# Patient Record
Sex: Female | Born: 1950 | Race: White | Hispanic: No | Marital: Married | Smoking: Never smoker
Health system: Southern US, Community
[De-identification: ages and names within clinical notes are randomized; demographics above are authoritative.]

## PROBLEM LIST (undated history)

## (undated) DIAGNOSIS — K219 Gastro-esophageal reflux disease without esophagitis: Secondary | ICD-10-CM

## (undated) DIAGNOSIS — Z9889 Other specified postprocedural states: Secondary | ICD-10-CM

## (undated) DIAGNOSIS — M5416 Radiculopathy, lumbar region: Secondary | ICD-10-CM

## (undated) DIAGNOSIS — Z79899 Other long term (current) drug therapy: Secondary | ICD-10-CM

## (undated) DIAGNOSIS — M1990 Unspecified osteoarthritis, unspecified site: Secondary | ICD-10-CM

## (undated) DIAGNOSIS — G8929 Other chronic pain: Secondary | ICD-10-CM

## (undated) DIAGNOSIS — N390 Urinary tract infection, site not specified: Secondary | ICD-10-CM

## (undated) DIAGNOSIS — B019 Varicella without complication: Secondary | ICD-10-CM

## (undated) DIAGNOSIS — R51 Headache: Secondary | ICD-10-CM

## (undated) DIAGNOSIS — E785 Hyperlipidemia, unspecified: Secondary | ICD-10-CM

## (undated) DIAGNOSIS — H269 Unspecified cataract: Secondary | ICD-10-CM

## (undated) DIAGNOSIS — I639 Cerebral infarction, unspecified: Secondary | ICD-10-CM

## (undated) DIAGNOSIS — R519 Headache, unspecified: Secondary | ICD-10-CM

## (undated) DIAGNOSIS — I10 Essential (primary) hypertension: Secondary | ICD-10-CM

## (undated) HISTORY — DX: Headache: R51

## (undated) HISTORY — DX: Unspecified osteoarthritis, unspecified site: M19.90

## (undated) HISTORY — DX: Headache, unspecified: R51.9

## (undated) HISTORY — DX: Other chronic pain: G89.29

## (undated) HISTORY — DX: Varicella without complication: B01.9

## (undated) HISTORY — PX: CARPAL TUNNEL RELEASE: SHX101

## (undated) HISTORY — PX: TONSILLECTOMY: SHX5217

## (undated) HISTORY — DX: Urinary tract infection, site not specified: N39.0

## (undated) HISTORY — DX: Other specified postprocedural states: Z98.890

## (undated) HISTORY — DX: Cerebral infarction, unspecified: I63.9

## (undated) HISTORY — PX: ROTATOR CUFF REPAIR: SHX139

## (undated) HISTORY — DX: Gastro-esophageal reflux disease without esophagitis: K21.9

## (undated) HISTORY — DX: Essential (primary) hypertension: I10

## (undated) HISTORY — PX: EYE SURGERY: SHX253

## (undated) HISTORY — DX: Radiculopathy, lumbar region: M54.16

## (undated) HISTORY — DX: Hyperlipidemia, unspecified: E78.5

## (undated) HISTORY — PX: CHOLECYSTECTOMY: SHX55

## (undated) HISTORY — DX: Unspecified cataract: H26.9

## (undated) HISTORY — DX: Other long term (current) drug therapy: Z79.899

## (undated) HISTORY — PX: CATARACT EXTRACTION: SUR2

## (undated) HISTORY — PX: SPINE SURGERY: SHX786

---

## 2012-12-13 ENCOUNTER — Ambulatory Visit (INDEPENDENT_AMBULATORY_CARE_PROVIDER_SITE_OTHER): Payer: Managed Care, Other (non HMO) | Admitting: Internal Medicine

## 2012-12-13 ENCOUNTER — Encounter: Payer: Self-pay | Admitting: Internal Medicine

## 2012-12-13 VITALS — BP 140/88 | HR 92 | Temp 98.1°F | Ht 66.5 in | Wt 139.0 lb

## 2012-12-13 DIAGNOSIS — Z79890 Hormone replacement therapy (postmenopausal): Secondary | ICD-10-CM | POA: Insufficient documentation

## 2012-12-13 DIAGNOSIS — IMO0002 Reserved for concepts with insufficient information to code with codable children: Secondary | ICD-10-CM

## 2012-12-13 DIAGNOSIS — G8929 Other chronic pain: Secondary | ICD-10-CM

## 2012-12-13 DIAGNOSIS — J029 Acute pharyngitis, unspecified: Secondary | ICD-10-CM | POA: Insufficient documentation

## 2012-12-13 DIAGNOSIS — M129 Arthropathy, unspecified: Secondary | ICD-10-CM

## 2012-12-13 DIAGNOSIS — Z79899 Other long term (current) drug therapy: Secondary | ICD-10-CM | POA: Insufficient documentation

## 2012-12-13 DIAGNOSIS — E785 Hyperlipidemia, unspecified: Secondary | ICD-10-CM

## 2012-12-13 DIAGNOSIS — Z9889 Other specified postprocedural states: Secondary | ICD-10-CM | POA: Insufficient documentation

## 2012-12-13 DIAGNOSIS — R519 Headache, unspecified: Secondary | ICD-10-CM

## 2012-12-13 DIAGNOSIS — M1990 Unspecified osteoarthritis, unspecified site: Secondary | ICD-10-CM | POA: Insufficient documentation

## 2012-12-13 DIAGNOSIS — R51 Headache: Secondary | ICD-10-CM

## 2012-12-13 NOTE — Patient Instructions (Addendum)
I think you have a viral throat infection that will run its course can use Tylenol gargles time he may get more congested and cough before it resolves. We will let you know about your backup strep test however  Would like you to do a headache diary for now you're using headache medicine.  Most people do better on some type of controller regimen and use the rescue medicine sparingly as they can cause rebound headaches and have sedative side effects.  I would like to get another opinion about your recurrent headaches.  I do not routinely prescribe more than 20-30 in a bottle of that particular medication.  If more needed would suggest we see a headache specialist.   I would not feel comfortable prescribing phentermine for you at a normal body weight because of his cardiovascular potential risk factors for stroke and heart disease.. This medication can raise blood pressure.  At this time I would think the risk is higher than the benefit. If you wish to seek care at a weight control sound heard they can reassess this.  Blood pressure is borderline high today 140/90 140/88 this could because of illness we will follow this.  Agree to continue with water exercise and walking to keep up your conditioning. I will review the records and make a decision about laboratory monitoring and followup. Please sign up for my chart that will help with communication in the future.
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
## 2012-12-15 ENCOUNTER — Encounter: Payer: Self-pay | Admitting: Family Medicine

## 2012-12-15 LAB — CULTURE, GROUP A STREP: Organism ID, Bacteria: NORMAL

## 2013-01-02 ENCOUNTER — Telehealth: Payer: Self-pay | Admitting: Internal Medicine

## 2013-01-02 NOTE — Telephone Encounter (Signed)
Upon check-out with spouse, patient states she would like to change from Dr. Fabian Sharp to Dr. Caryl Never as her PCP.  She has an upcoming appointment on 01/18/13 that she would like to have with Dr. Caryl Never.  Please advise.   Thanks a bunch  [employer]

## 2013-01-03 NOTE — Telephone Encounter (Signed)
Ok with me 

## 2013-01-04 NOTE — Telephone Encounter (Signed)
Ok

## 2013-01-18 ENCOUNTER — Encounter: Payer: Managed Care, Other (non HMO) | Admitting: Internal Medicine

## 2013-02-02 ENCOUNTER — Ambulatory Visit (INDEPENDENT_AMBULATORY_CARE_PROVIDER_SITE_OTHER): Payer: Managed Care, Other (non HMO)

## 2013-02-02 DIAGNOSIS — Z23 Encounter for immunization: Secondary | ICD-10-CM

## 2013-02-12 ENCOUNTER — Other Ambulatory Visit: Payer: Self-pay

## 2013-02-12 ENCOUNTER — Encounter: Payer: Self-pay | Admitting: Family Medicine

## 2013-02-12 ENCOUNTER — Ambulatory Visit (INDEPENDENT_AMBULATORY_CARE_PROVIDER_SITE_OTHER): Payer: Managed Care, Other (non HMO) | Admitting: Family Medicine

## 2013-02-12 VITALS — BP 136/74 | HR 88 | Temp 97.5°F | Wt 146.0 lb

## 2013-02-12 DIAGNOSIS — Z0000 Encounter for general adult medical examination without abnormal findings: Secondary | ICD-10-CM

## 2013-02-12 DIAGNOSIS — Z1231 Encounter for screening mammogram for malignant neoplasm of breast: Secondary | ICD-10-CM

## 2013-02-12 LAB — LIPID PANEL
Cholesterol: 183 mg/dL (ref 0–200)
HDL: 74.5 mg/dL (ref >39.00)
LDL Cholesterol: 78 mg/dL (ref 0–99)
Total CHOL/HDL Ratio: 2

## 2013-02-12 LAB — BASIC METABOLIC PANEL
BUN: 16 mg/dL (ref 6–23)
Calcium: 9.3 mg/dL (ref 8.4–10.5)
Chloride: 103 mEq/L (ref 96–112)
Creatinine, Ser: 1.1 mg/dL (ref 0.4–1.2)
GFR: 55.32 mL/min — ABNORMAL LOW (ref >60.00)
Potassium: 3.9 mEq/L (ref 3.5–5.1)
Sodium: 142 mEq/L (ref 135–145)

## 2013-02-12 LAB — HEPATIC FUNCTION PANEL
Alkaline Phosphatase: 86 U/L (ref 39–117)
Bilirubin, Direct: 0.1 mg/dL (ref 0.0–0.3)
Total Bilirubin: 0.4 mg/dL (ref 0.3–1.2)
Total Protein: 7 g/dL (ref 6.0–8.3)

## 2013-02-12 LAB — POCT URINALYSIS DIPSTICK
Glucose, UA: NEGATIVE
Ketones, UA: NEGATIVE
Spec Grav, UA: 1.02
Urobilinogen, UA: 0.2

## 2013-02-12 LAB — CBC WITH DIFFERENTIAL/PLATELET
Basophils Relative: 0.5 % (ref 0.0–3.0)
HCT: 40.7 % (ref 36.0–46.0)
Hemoglobin: 14 g/dL (ref 12.0–15.0)
Lymphocytes Relative: 28 % (ref 12.0–46.0)
MCHC: 34.5 g/dL (ref 30.0–36.0)
Monocytes Relative: 6.1 % (ref 3.0–12.0)
Neutro Abs: 4.8 10*3/uL (ref 1.4–7.7)
RBC: 4.63 Mil/uL (ref 3.87–5.11)

## 2013-02-12 MED ORDER — PRAVASTATIN SODIUM 80 MG PO TABS: 80.0000 mg | ORAL_TABLET | Freq: Every day | ORAL | Status: DC

## 2013-02-12 MED ORDER — CARISOPRODOL 350 MG PO TABS: 350.0000 mg | ORAL_TABLET | Freq: Every day | ORAL | Status: DC

## 2013-02-12 NOTE — Patient Instructions (Signed)
Check on insurance coverage for shingles vaccine Set up mammogram
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
## 2013-02-23 ENCOUNTER — Encounter: Payer: Self-pay | Admitting: Family Medicine

## 2013-02-23 ENCOUNTER — Other Ambulatory Visit: Payer: Self-pay

## 2013-02-23 MED ORDER — GABAPENTIN 600 MG PO TABS: ORAL_TABLET | ORAL | Status: DC

## 2013-03-01 ENCOUNTER — Ambulatory Visit (INDEPENDENT_AMBULATORY_CARE_PROVIDER_SITE_OTHER): Payer: Managed Care, Other (non HMO) | Admitting: Family Medicine

## 2013-03-01 DIAGNOSIS — Z23 Encounter for immunization: Secondary | ICD-10-CM

## 2013-03-14 ENCOUNTER — Ambulatory Visit
Admission: RE | Admit: 2013-03-14 | Discharge: 2013-03-14 | Disposition: A | Discharge status: Home / Self Care | Payer: Managed Care, Other (non HMO) | Source: Ambulatory Visit

## 2013-03-14 DIAGNOSIS — Z1231 Encounter for screening mammogram for malignant neoplasm of breast: Secondary | ICD-10-CM

## 2013-03-20 ENCOUNTER — Encounter: Payer: Self-pay | Admitting: Family Medicine

## 2013-03-20 ENCOUNTER — Other Ambulatory Visit: Payer: Self-pay

## 2013-03-20 MED ORDER — ESOMEPRAZOLE MAGNESIUM 40 MG PO CPDR: 40.0000 mg | DELAYED_RELEASE_CAPSULE | Freq: Every day | ORAL | Status: DC

## 2013-03-22 ENCOUNTER — Other Ambulatory Visit: Payer: Self-pay

## 2013-03-22 ENCOUNTER — Encounter: Payer: Self-pay | Admitting: Family Medicine

## 2013-03-22 MED ORDER — BUTALBITAL-APAP-CAFFEINE 50-325-40 MG PO TABS: 1.0000 | ORAL_TABLET | ORAL | Status: DC | PRN

## 2013-04-21 ENCOUNTER — Encounter: Payer: Self-pay | Admitting: Family Medicine

## 2013-04-23 ENCOUNTER — Other Ambulatory Visit: Payer: Self-pay

## 2013-04-23 MED ORDER — CONJ ESTROG-MEDROXYPROGEST ACE 0.625-5 MG PO TABS: 1.0000 | ORAL_TABLET | Freq: Every day | ORAL | Status: DC

## 2013-05-23 ENCOUNTER — Encounter: Payer: Self-pay | Admitting: Family Medicine

## 2013-05-23 ENCOUNTER — Other Ambulatory Visit: Payer: Self-pay

## 2013-05-23 ENCOUNTER — Telehealth: Payer: Self-pay

## 2013-05-23 MED ORDER — CARISOPRODOL 350 MG PO TABS: 350.0000 mg | ORAL_TABLET | Freq: Every day | ORAL | Status: DC

## 2013-05-23 MED ORDER — GABAPENTIN 600 MG PO TABS: ORAL_TABLET | ORAL | Status: DC

## 2013-05-23 MED ORDER — PRAVASTATIN SODIUM 80 MG PO TABS: 80.0000 mg | ORAL_TABLET | Freq: Every day | ORAL | Status: DC

## 2013-05-23 NOTE — Telephone Encounter (Signed)
Refill ok? 

## 2013-05-23 NOTE — Telephone Encounter (Signed)
SOMA  Last visit 02/12/13 Last refill 02/12/13 #90 1

## 2013-05-23 NOTE — Telephone Encounter (Signed)
[facility] faxed to [facility] 

## 2013-05-24 ENCOUNTER — Other Ambulatory Visit: Payer: Self-pay

## 2013-05-24 MED ORDER — PRAVASTATIN SODIUM 80 MG PO TABS: 80.0000 mg | ORAL_TABLET | Freq: Every day | ORAL | Status: DC

## 2013-05-25 ENCOUNTER — Telehealth: Payer: Self-pay | Admitting: Family Medicine

## 2013-05-25 NOTE — Telephone Encounter (Signed)
Sonia Side from [facility] needs clarification on new rx pravastatin (PRAVACHOL) 80 MG tablet, pt is requesting generic and rx was written for daw1.

## 2013-05-25 NOTE — Telephone Encounter (Signed)
Talked to [facility]. They will be sending out the generic only to patient address

## 2013-06-19 ENCOUNTER — Encounter: Payer: Self-pay | Admitting: *Deleted

## 2013-06-20 ENCOUNTER — Ambulatory Visit (INDEPENDENT_AMBULATORY_CARE_PROVIDER_SITE_OTHER): Payer: BC Managed Care – PPO | Admitting: Family Medicine

## 2013-06-20 ENCOUNTER — Encounter: Payer: Self-pay | Admitting: Family Medicine

## 2013-06-20 VITALS — BP 132/78 | HR 98 | Wt 155.0 lb

## 2013-06-20 DIAGNOSIS — IMO0002 Reserved for concepts with insufficient information to code with codable children: Secondary | ICD-10-CM

## 2013-06-20 DIAGNOSIS — M5416 Radiculopathy, lumbar region: Secondary | ICD-10-CM

## 2013-06-20 NOTE — Progress Notes (Signed)
Pre visit review using our clinic review tool, if applicable. No additional management support is needed unless otherwise documented below in the visit note. 
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
## 2013-07-06 ENCOUNTER — Other Ambulatory Visit: Payer: Self-pay

## 2013-07-06 ENCOUNTER — Telehealth: Payer: Self-pay

## 2013-07-06 ENCOUNTER — Encounter: Payer: Self-pay | Admitting: Family Medicine

## 2013-07-06 MED ORDER — ESOMEPRAZOLE MAGNESIUM 40 MG PO CPDR: 40.0000 mg | DELAYED_RELEASE_CAPSULE | Freq: Every day | ORAL | Status: DC

## 2013-07-06 MED ORDER — BUTALBITAL-APAP-CAFFEINE 50-325-40 MG PO TABS: 1.0000 | ORAL_TABLET | ORAL | Status: DC | PRN

## 2013-07-06 NOTE — Telephone Encounter (Signed)
Rx faxed to [facility].  

## 2013-07-06 NOTE — Telephone Encounter (Signed)
Fioricet  Last refill 03/12/13 #270 Last visit 06/20/13

## 2013-07-06 NOTE — Telephone Encounter (Signed)
May refill #120.  We really need to look at prophylaxis if she is needing > this amount over several months.

## 2013-08-08 ENCOUNTER — Telehealth: Payer: Self-pay

## 2013-08-08 ENCOUNTER — Encounter: Payer: Self-pay | Admitting: Family Medicine

## 2013-08-08 NOTE — Telephone Encounter (Signed)
Refill on buutalbital-acet-caff  Last visit 06/20/13 Last refill 07/06/13 #120 0 refill   FYI this is what the patient sent: in March I asked for RX Fioricet tab Generic But/APAP/CAF tab APAP 325 qty 270 for a 3 month supply and always had refills on this, but you have reduced the qty to 120 with NO refill's Why would you do that? I am going on vacation in 2 weeks and will be out of the country for a month, what am I going to do when I run out of these when away and this will happen. I need another RX

## 2013-08-09 ENCOUNTER — Encounter: Payer: Self-pay | Admitting: Family Medicine

## 2013-08-10 MED ORDER — BUTALBITAL-APAP-CAFFEINE 50-325-40 MG PO TABS: 1.0000 | ORAL_TABLET | ORAL | Status: DC | PRN

## 2013-08-10 NOTE — Telephone Encounter (Signed)
We can refill once since she will be out of country.  When she gets back i want to see her to discuss.  Fioricet is never recommended for daily regular use for headaches and we need to evaluate other options.

## 2013-08-10 NOTE — Telephone Encounter (Signed)
Faxed RX to [facility]

## 2013-10-01 ENCOUNTER — Encounter: Payer: Self-pay | Admitting: Family Medicine

## 2013-10-02 ENCOUNTER — Encounter: Payer: Self-pay | Admitting: Family Medicine

## 2013-10-05 ENCOUNTER — Ambulatory Visit (INDEPENDENT_AMBULATORY_CARE_PROVIDER_SITE_OTHER): Payer: BC Managed Care – PPO | Admitting: Family Medicine

## 2013-10-05 ENCOUNTER — Encounter: Payer: Self-pay | Admitting: Family Medicine

## 2013-10-05 VITALS — BP 132/74 | HR 77 | Temp 98.6°F | Wt 160.0 lb

## 2013-10-05 DIAGNOSIS — R635 Abnormal weight gain: Secondary | ICD-10-CM

## 2013-10-05 DIAGNOSIS — E785 Hyperlipidemia, unspecified: Secondary | ICD-10-CM

## 2013-10-05 DIAGNOSIS — M5416 Radiculopathy, lumbar region: Secondary | ICD-10-CM

## 2013-10-05 DIAGNOSIS — R51 Headache: Secondary | ICD-10-CM

## 2013-10-05 DIAGNOSIS — G8929 Other chronic pain: Secondary | ICD-10-CM

## 2013-10-05 DIAGNOSIS — IMO0002 Reserved for concepts with insufficient information to code with codable children: Secondary | ICD-10-CM

## 2013-10-05 DIAGNOSIS — R519 Headache, unspecified: Secondary | ICD-10-CM
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
## 2013-10-05 NOTE — Progress Notes (Signed)
Pre visit review using our clinic review tool, if applicable. No additional management support is needed unless otherwise documented below in the visit note. 

## 2013-10-05 NOTE — Patient Instructions (Signed)
We will call you regarding headache specialist.

## 2013-10-19 ENCOUNTER — Other Ambulatory Visit: Payer: Self-pay

## 2013-10-19 ENCOUNTER — Encounter: Payer: Self-pay | Admitting: Family Medicine

## 2013-10-19 MED ORDER — BUTALBITAL-APAP-CAFFEINE 50-325-40 MG PO TABS: 1.0000 | ORAL_TABLET | ORAL | Status: DC | PRN

## 2013-12-14 ENCOUNTER — Other Ambulatory Visit: Payer: Self-pay | Admitting: Family Medicine

## 2013-12-14 ENCOUNTER — Encounter: Payer: Self-pay | Admitting: Family Medicine

## 2013-12-14 ENCOUNTER — Ambulatory Visit (INDEPENDENT_AMBULATORY_CARE_PROVIDER_SITE_OTHER): Payer: BC Managed Care – PPO | Admitting: Family Medicine

## 2013-12-14 VITALS — BP 130/70 | HR 93 | Temp 98.0°F | Wt 163.0 lb

## 2013-12-14 DIAGNOSIS — E785 Hyperlipidemia, unspecified: Secondary | ICD-10-CM

## 2013-12-14 DIAGNOSIS — IMO0002 Reserved for concepts with insufficient information to code with codable children: Secondary | ICD-10-CM

## 2013-12-14 DIAGNOSIS — R519 Headache, unspecified: Secondary | ICD-10-CM

## 2013-12-14 DIAGNOSIS — M5416 Radiculopathy, lumbar region: Principal | ICD-10-CM

## 2013-12-14 DIAGNOSIS — G8929 Other chronic pain: Secondary | ICD-10-CM

## 2013-12-14 DIAGNOSIS — Z23 Encounter for immunization: Secondary | ICD-10-CM

## 2013-12-14 DIAGNOSIS — R51 Headache: Secondary | ICD-10-CM
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
## 2013-12-14 NOTE — Progress Notes (Signed)
Pre visit review using our clinic review tool, if applicable. No additional management support is needed unless otherwise documented below in the visit note. 

## 2013-12-18 ENCOUNTER — Other Ambulatory Visit (INDEPENDENT_AMBULATORY_CARE_PROVIDER_SITE_OTHER): Payer: BC Managed Care – PPO

## 2013-12-18 DIAGNOSIS — E785 Hyperlipidemia, unspecified: Secondary | ICD-10-CM

## 2013-12-18 LAB — LIPID PANEL
Cholesterol: 173 mg/dL (ref 0–200)
HDL: 62.6 mg/dL (ref >39.00)
LDL CALC: 88 mg/dL (ref 0–99)
NonHDL: 110.4
Total CHOL/HDL Ratio: 3
Triglycerides: 114 mg/dL (ref 0.0–149.0)
VLDL: 22.8 mg/dL (ref 0.0–40.0)

## 2013-12-18 LAB — HEPATIC FUNCTION PANEL
ALBUMIN: 3.7 g/dL (ref 3.5–5.2)
ALT: 11 U/L (ref 0–35)
AST: 16 U/L (ref 0–37)
Alkaline Phosphatase: 81 U/L (ref 39–117)
BILIRUBIN TOTAL: 0.5 mg/dL (ref 0.2–1.2)
Bilirubin, Direct: 0 mg/dL (ref 0.0–0.3)
Total Protein: 6.6 g/dL (ref 6.0–8.3)

## 2013-12-18 LAB — BASIC METABOLIC PANEL
BUN: 13 mg/dL (ref 6–23)
CALCIUM: 9.1 mg/dL (ref 8.4–10.5)
CO2: 27 mEq/L (ref 19–32)
Chloride: 105 mEq/L (ref 96–112)
Creatinine, Ser: 1 mg/dL (ref 0.4–1.2)
GFR: 58.96 mL/min — AB (ref >60.00)
GLUCOSE: 80 mg/dL (ref 70–99)
POTASSIUM: 4.7 meq/L (ref 3.5–5.1)
SODIUM: 140 meq/L (ref 135–145)

## 2013-12-19 ENCOUNTER — Encounter: Payer: Self-pay | Admitting: Family Medicine

## 2013-12-25 ENCOUNTER — Encounter: Payer: Self-pay | Admitting: Family Medicine

## 2013-12-26 ENCOUNTER — Other Ambulatory Visit: Payer: Self-pay

## 2013-12-26 MED ORDER — CARISOPRODOL 350 MG PO TABS: 350.0000 mg | ORAL_TABLET | Freq: Every day | ORAL | Status: DC

## 2014-03-21 ENCOUNTER — Other Ambulatory Visit: Payer: Self-pay | Admitting: Family Medicine

## 2014-03-21 ENCOUNTER — Encounter: Payer: Self-pay | Admitting: Family Medicine

## 2014-03-21 ENCOUNTER — Ambulatory Visit (INDEPENDENT_AMBULATORY_CARE_PROVIDER_SITE_OTHER): Payer: BC Managed Care – PPO | Admitting: Family Medicine

## 2014-03-21 VITALS — BP 126/70 | HR 79 | Temp 97.7°F | Wt 167.0 lb

## 2014-03-21 DIAGNOSIS — R51 Headache: Secondary | ICD-10-CM

## 2014-03-21 DIAGNOSIS — G8929 Other chronic pain: Secondary | ICD-10-CM

## 2014-03-21 DIAGNOSIS — R519 Headache, unspecified: Secondary | ICD-10-CM

## 2014-03-21 MED ORDER — GABAPENTIN 600 MG PO TABS: ORAL_TABLET | ORAL | Status: DC

## 2014-03-21 MED ORDER — ESOMEPRAZOLE MAGNESIUM 40 MG PO CPDR: 40.0000 mg | DELAYED_RELEASE_CAPSULE | Freq: Every day | ORAL | Status: DC

## 2014-03-21 MED ORDER — PRAVASTATIN SODIUM 80 MG PO TABS: 80.0000 mg | ORAL_TABLET | Freq: Every day | ORAL | Status: DC

## 2014-03-21 NOTE — Progress Notes (Signed)
Pre visit review using our clinic review tool, if applicable. No additional management support is needed unless otherwise documented below in the visit note. 
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
## 2014-03-31 ENCOUNTER — Encounter: Payer: Self-pay | Admitting: Family Medicine

## 2014-04-02 ENCOUNTER — Other Ambulatory Visit: Payer: Self-pay

## 2014-04-02 MED ORDER — CARISOPRODOL 350 MG PO TABS: 350.0000 mg | ORAL_TABLET | Freq: Every day | ORAL | Status: DC

## 2014-04-06 ENCOUNTER — Encounter: Payer: Self-pay | Admitting: Family Medicine

## 2014-04-09 ENCOUNTER — Ambulatory Visit (INDEPENDENT_AMBULATORY_CARE_PROVIDER_SITE_OTHER): Payer: BC Managed Care – PPO | Admitting: Family Medicine

## 2014-04-09 ENCOUNTER — Other Ambulatory Visit: Payer: Self-pay

## 2014-04-09 ENCOUNTER — Telehealth: Payer: Self-pay

## 2014-04-09 ENCOUNTER — Encounter: Payer: Self-pay | Admitting: Family Medicine

## 2014-04-09 VITALS — BP 130/70 | HR 74 | Temp 97.6°F | Wt 167.0 lb

## 2014-04-09 DIAGNOSIS — M546 Pain in thoracic spine: Secondary | ICD-10-CM

## 2014-04-09 DIAGNOSIS — R319 Hematuria, unspecified: Secondary | ICD-10-CM

## 2014-04-09 LAB — POCT URINALYSIS DIPSTICK
Bilirubin, UA: NEGATIVE
Glucose, UA: NEGATIVE
KETONES UA: NEGATIVE
Nitrite, UA: NEGATIVE
PH UA: 7
PROTEIN UA: NEGATIVE
SPEC GRAV UA: 1.015
Urobilinogen, UA: 0.2

## 2014-04-09 MED ORDER — TRAMADOL HCL 50 MG PO TABS: ORAL_TABLET | ORAL | Status: DC

## 2014-04-09 NOTE — Telephone Encounter (Signed)
Rx sent to mail order
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
## 2014-04-09 NOTE — Telephone Encounter (Signed)
Refill request for Soma 350mg  #90  Sent to [facility]

## 2014-04-09 NOTE — Progress Notes (Signed)
Pre visit review using our clinic review tool, if applicable. No additional management support is needed unless otherwise documented below in the visit note. 

## 2014-04-09 NOTE — Patient Instructions (Signed)

## 2014-04-10 ENCOUNTER — Encounter: Payer: Self-pay | Admitting: Family Medicine

## 2014-04-11 ENCOUNTER — Encounter: Payer: Self-pay | Admitting: Family Medicine

## 2014-04-11 LAB — URINE CULTURE

## 2014-04-15 ENCOUNTER — Telehealth: Payer: Self-pay

## 2014-04-15 NOTE — Telephone Encounter (Signed)
Opened by a mistake.

## 2014-05-07 ENCOUNTER — Encounter: Payer: Self-pay | Admitting: Family Medicine

## 2014-05-08 ENCOUNTER — Other Ambulatory Visit: Payer: Self-pay | Admitting: Family Medicine

## 2014-05-08 DIAGNOSIS — R519 Headache, unspecified: Secondary | ICD-10-CM

## 2014-05-08 DIAGNOSIS — R51 Headache: Principal | ICD-10-CM

## 2014-07-08 ENCOUNTER — Encounter: Payer: Self-pay | Admitting: Family Medicine

## 2014-07-08 MED ORDER — ESOMEPRAZOLE MAGNESIUM 40 MG PO CPDR: 40.0000 mg | DELAYED_RELEASE_CAPSULE | Freq: Every day | ORAL | Status: DC

## 2014-07-08 MED ORDER — CARISOPRODOL 350 MG PO TABS: 350.0000 mg | ORAL_TABLET | Freq: Every day | ORAL | Status: DC

## 2014-07-10 ENCOUNTER — Encounter: Payer: Self-pay | Admitting: Family Medicine

## 2014-07-11 ENCOUNTER — Other Ambulatory Visit: Payer: Self-pay

## 2014-07-11 MED ORDER — CARISOPRODOL 350 MG PO TABS: 350.0000 mg | ORAL_TABLET | Freq: Every day | ORAL | Status: DC

## 2014-07-11 MED ORDER — ESOMEPRAZOLE MAGNESIUM 40 MG PO CPDR: 40.0000 mg | DELAYED_RELEASE_CAPSULE | Freq: Every day | ORAL | Status: DC

## 2014-07-12 ENCOUNTER — Other Ambulatory Visit: Payer: Self-pay

## 2014-07-12 MED ORDER — PANTOPRAZOLE SODIUM 40 MG PO TBEC: 40.0000 mg | DELAYED_RELEASE_TABLET | Freq: Every day | ORAL | Status: DC

## 2014-07-28 ENCOUNTER — Encounter: Payer: Self-pay | Admitting: Family Medicine

## 2014-07-29 ENCOUNTER — Other Ambulatory Visit: Payer: Self-pay

## 2014-07-29 MED ORDER — GABAPENTIN 600 MG PO TABS: ORAL_TABLET | ORAL | Status: DC

## 2014-08-29 ENCOUNTER — Encounter: Payer: Self-pay | Admitting: Family Medicine

## 2014-09-01 ENCOUNTER — Encounter: Payer: Self-pay | Admitting: Family Medicine

## 2014-09-02 ENCOUNTER — Telehealth: Payer: Self-pay | Admitting: Family Medicine

## 2014-09-02 NOTE — Telephone Encounter (Signed)
Pt calling to check status of previous mychart messages sent regarding request for early refill of Carisoprodol 350mg .  Pt reports [place] will be going out [place] the [place] and needs medication called in to pharmacy today.

## 2014-09-02 NOTE — Telephone Encounter (Signed)
Called and gave approval for the Rx to refilled early.

## 2014-10-10 ENCOUNTER — Other Ambulatory Visit: Payer: Self-pay

## 2014-10-10 MED ORDER — GABAPENTIN 600 MG PO TABS: ORAL_TABLET | ORAL | Status: DC

## 2014-10-28 ENCOUNTER — Encounter: Payer: Self-pay | Admitting: Family Medicine

## 2014-10-30 ENCOUNTER — Other Ambulatory Visit: Payer: Self-pay | Admitting: Family Medicine

## 2014-10-30 DIAGNOSIS — R51 Headache: Principal | ICD-10-CM

## 2014-10-30 DIAGNOSIS — R519 Headache, unspecified: Secondary | ICD-10-CM

## 2014-11-10 ENCOUNTER — Encounter: Payer: Self-pay | Admitting: Family Medicine

## 2014-11-11 ENCOUNTER — Other Ambulatory Visit: Payer: Self-pay

## 2014-11-11 MED ORDER — CARISOPRODOL 350 MG PO TABS: 350.0000 mg | ORAL_TABLET | Freq: Every day | ORAL | Status: DC

## 2014-11-11 MED ORDER — GABAPENTIN 600 MG PO TABS: ORAL_TABLET | ORAL | Status: DC

## 2014-11-12 ENCOUNTER — Other Ambulatory Visit: Payer: Self-pay

## 2014-11-12 MED ORDER — TIZANIDINE HCL 2 MG PO TABS: 2.0000 mg | ORAL_TABLET | Freq: Every day | ORAL | Status: DC

## 2014-11-15 ENCOUNTER — Encounter: Payer: Self-pay | Admitting: Family Medicine

## 2014-12-08 ENCOUNTER — Encounter: Payer: Self-pay | Admitting: Family Medicine

## 2014-12-09 ENCOUNTER — Other Ambulatory Visit: Payer: Self-pay

## 2014-12-09 MED ORDER — PANTOPRAZOLE SODIUM 40 MG PO TBEC: 40.0000 mg | DELAYED_RELEASE_TABLET | Freq: Every day | ORAL | Status: DC

## 2014-12-09 MED ORDER — PRAVASTATIN SODIUM 80 MG PO TABS: 80.0000 mg | ORAL_TABLET | Freq: Every day | ORAL | Status: DC

## 2014-12-09 MED ORDER — GABAPENTIN 600 MG PO TABS: ORAL_TABLET | ORAL | Status: DC

## 2014-12-09 MED ORDER — CYCLOBENZAPRINE HCL 10 MG PO TABS: 10.0000 mg | ORAL_TABLET | Freq: Every day | ORAL | Status: DC

## 2014-12-15 ENCOUNTER — Encounter: Payer: Self-pay | Admitting: Family Medicine

## 2015-01-06 ENCOUNTER — Ambulatory Visit (INDEPENDENT_AMBULATORY_CARE_PROVIDER_SITE_OTHER): Payer: 59 | Admitting: Family Medicine

## 2015-01-06 VITALS — BP 124/82 | HR 106 | Temp 98.0°F | Ht 66.5 in | Wt 171.8 lb

## 2015-01-06 DIAGNOSIS — Z0000 Encounter for general adult medical examination without abnormal findings: Secondary | ICD-10-CM

## 2015-01-06 DIAGNOSIS — Z23 Encounter for immunization: Secondary | ICD-10-CM | POA: Diagnosis not present

## 2015-01-06 DIAGNOSIS — E538 Deficiency of other specified B group vitamins: Secondary | ICD-10-CM

## 2015-01-06 LAB — VITAMIN B12: Vitamin B-12: 197 pg/mL — ABNORMAL LOW (ref 211–911)

## 2015-01-06 LAB — HEPATIC FUNCTION PANEL
ALT: 9 U/L (ref 0–35)
AST: 13 U/L (ref 0–37)
Albumin: 3.9 g/dL (ref 3.5–5.2)
Alkaline Phosphatase: 97 U/L (ref 39–117)
Bilirubin, Direct: 0.1 mg/dL (ref 0.0–0.3)
TOTAL PROTEIN: 6.1 g/dL (ref 6.0–8.3)
Total Bilirubin: 0.4 mg/dL (ref 0.2–1.2)

## 2015-01-06 LAB — BASIC METABOLIC PANEL
BUN: 14 mg/dL (ref 6–23)
CALCIUM: 9 mg/dL (ref 8.4–10.5)
CO2: 26 meq/L (ref 19–32)
Chloride: 102 mEq/L (ref 96–112)
Creatinine, Ser: 0.95 mg/dL (ref 0.40–1.20)
GFR: 63.07 mL/min (ref >60.00)
Glucose, Bld: 76 mg/dL (ref 70–99)
Potassium: 4.2 mEq/L (ref 3.5–5.1)
SODIUM: 139 meq/L (ref 135–145)

## 2015-01-06 LAB — CBC WITH DIFFERENTIAL/PLATELET
BASOS PCT: 0.8 % (ref 0.0–3.0)
Basophils Absolute: 0.1 10*3/uL (ref 0.0–0.1)
EOS PCT: 1.7 % (ref 0.0–5.0)
Eosinophils Absolute: 0.2 10*3/uL (ref 0.0–0.7)
HEMATOCRIT: 41.9 % (ref 36.0–46.0)
Hemoglobin: 13.9 g/dL (ref 12.0–15.0)
LYMPHS ABS: 2.1 10*3/uL (ref 0.7–4.0)
Lymphocytes Relative: 22.9 % (ref 12.0–46.0)
MCHC: 33.2 g/dL (ref 30.0–36.0)
MCV: 88.3 fl (ref 78.0–100.0)
MONOS PCT: 5.3 % (ref 3.0–12.0)
Monocytes Absolute: 0.5 10*3/uL (ref 0.1–1.0)
NEUTROS ABS: 6.5 10*3/uL (ref 1.4–7.7)
NEUTROS PCT: 69.3 % (ref 43.0–77.0)
Platelets: 232 10*3/uL (ref 150.0–400.0)
RBC: 4.74 Mil/uL (ref 3.87–5.11)
RDW: 12.6 % (ref 11.5–15.5)
WBC: 9.4 10*3/uL (ref 4.0–10.5)

## 2015-01-06 LAB — LIPID PANEL
CHOL/HDL RATIO: 3
CHOLESTEROL: 199 mg/dL (ref 0–200)
HDL: 62.7 mg/dL (ref >39.00)
LDL Cholesterol: 108 mg/dL — ABNORMAL HIGH (ref 0–99)
NonHDL: 136.58
TRIGLYCERIDES: 141 mg/dL (ref 0.0–149.0)
VLDL: 28.2 mg/dL (ref 0.0–40.0)

## 2015-01-06 LAB — TSH: TSH: 0.72 u[IU]/mL (ref 0.35–4.50)

## 2015-01-06 MED ORDER — TIZANIDINE HCL 2 MG PO TABS: 2.0000 mg | ORAL_TABLET | Freq: Every day | ORAL | Status: DC

## 2015-01-06 NOTE — Progress Notes (Signed)
Pre visit review using our clinic review tool, if applicable. No additional management support is needed unless otherwise documented below in the visit note. 
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
## 2015-01-07 NOTE — Progress Notes (Signed)
Pt would like to try pills instead of IM.

## 2015-01-08 ENCOUNTER — Encounter: Payer: Self-pay | Admitting: Family Medicine

## 2015-01-09 ENCOUNTER — Other Ambulatory Visit: Payer: Self-pay | Admitting: Family Medicine

## 2015-01-09 DIAGNOSIS — E538 Deficiency of other specified B group vitamins: Secondary | ICD-10-CM

## 2015-01-09 MED ORDER — "SYRINGE/NEEDLE (DISP) 25G X 1"" 3 ML MISC": Status: DC

## 2015-01-09 MED ORDER — CYANOCOBALAMIN 1000 MCG/ML IJ SOLN: INTRAMUSCULAR | Status: DC

## 2015-01-09 NOTE — Telephone Encounter (Signed)
Rx sent to pharmacy for Vitamin b12 and syringes.

## 2015-01-14 ENCOUNTER — Encounter: Payer: Self-pay | Admitting: Family Medicine

## 2015-01-15 ENCOUNTER — Ambulatory Visit (INDEPENDENT_AMBULATORY_CARE_PROVIDER_SITE_OTHER): Payer: 59 | Admitting: Family Medicine

## 2015-01-15 DIAGNOSIS — E538 Deficiency of other specified B group vitamins: Secondary | ICD-10-CM

## 2015-01-15 MED ORDER — CYANOCOBALAMIN 1000 MCG/ML IJ SOLN
1000.0000 ug | Freq: Once | INTRAMUSCULAR | Status: AC
  Administered 2015-01-15: 1000 ug via INTRAMUSCULAR

## 2015-01-22 ENCOUNTER — Ambulatory Visit (INDEPENDENT_AMBULATORY_CARE_PROVIDER_SITE_OTHER): Payer: 59 | Admitting: Family Medicine

## 2015-01-22 DIAGNOSIS — E538 Deficiency of other specified B group vitamins: Secondary | ICD-10-CM | POA: Diagnosis not present

## 2015-01-22 MED ORDER — CYANOCOBALAMIN 1000 MCG/ML IJ SOLN: 1000.0000 ug | Freq: Once | INTRAMUSCULAR | Status: DC

## 2015-01-22 MED ORDER — CYANOCOBALAMIN 1000 MCG/ML IJ SOLN
1000.0000 ug | Freq: Once | INTRAMUSCULAR | Status: AC
  Administered 2015-01-22: 1000 ug via INTRAMUSCULAR

## 2015-01-22 NOTE — Addendum Note (Signed)
Addended by: Ailene Rud E on: 01/22/2015 12:16 PM   Modules accepted: Orders

## 2015-01-29 ENCOUNTER — Ambulatory Visit (INDEPENDENT_AMBULATORY_CARE_PROVIDER_SITE_OTHER): Payer: 59 | Admitting: Family Medicine

## 2015-01-29 DIAGNOSIS — E538 Deficiency of other specified B group vitamins: Secondary | ICD-10-CM

## 2015-01-29 MED ORDER — CYANOCOBALAMIN 1000 MCG/ML IJ SOLN
1000.0000 ug | Freq: Once | INTRAMUSCULAR | Status: AC
  Administered 2015-01-29: 1000 ug via INTRAMUSCULAR

## 2015-02-04 ENCOUNTER — Encounter: Payer: Self-pay | Admitting: Family Medicine

## 2015-02-05 ENCOUNTER — Other Ambulatory Visit: Payer: Self-pay | Admitting: Family Medicine

## 2015-02-05 MED ORDER — CYCLOBENZAPRINE HCL 10 MG PO TABS: 10.0000 mg | ORAL_TABLET | Freq: Every day | ORAL | Status: DC

## 2015-02-06 ENCOUNTER — Ambulatory Visit (INDEPENDENT_AMBULATORY_CARE_PROVIDER_SITE_OTHER): Payer: 59 | Admitting: Family Medicine

## 2015-02-06 DIAGNOSIS — E538 Deficiency of other specified B group vitamins: Secondary | ICD-10-CM

## 2015-02-06 MED ORDER — CYANOCOBALAMIN 1000 MCG/ML IJ SOLN
1000.0000 ug | Freq: Once | INTRAMUSCULAR | Status: AC
  Administered 2015-02-06: 1000 ug via INTRAMUSCULAR

## 2015-02-07 ENCOUNTER — Encounter: Payer: Self-pay | Admitting: Family Medicine

## 2015-02-07 ENCOUNTER — Ambulatory Visit (INDEPENDENT_AMBULATORY_CARE_PROVIDER_SITE_OTHER): Payer: 59 | Admitting: Family Medicine

## 2015-02-07 VITALS — BP 120/80 | HR 100 | Temp 97.6°F | Wt 176.0 lb

## 2015-02-07 DIAGNOSIS — E538 Deficiency of other specified B group vitamins: Secondary | ICD-10-CM | POA: Diagnosis not present

## 2015-02-07 DIAGNOSIS — R5382 Chronic fatigue, unspecified: Secondary | ICD-10-CM | POA: Diagnosis not present

## 2015-02-07 NOTE — Progress Notes (Signed)
Pre visit review using our clinic review tool, if applicable. No additional management support is needed unless otherwise documented below in the visit note. 

## 2015-02-07 NOTE — Patient Instructions (Signed)
Schedule B12 level for sometime in late January.
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
## 2015-02-10 ENCOUNTER — Encounter: Payer: Self-pay | Admitting: Family Medicine

## 2015-02-10 NOTE — Telephone Encounter (Signed)
Estill Bamberg, please see message.

## 2015-02-20 NOTE — Telephone Encounter (Signed)
I called patient and explained why she was being billed. She is scheduled for another b12 at the end of the month and will bring someone with her so we can teach them how to give the injection at home going forward.

## 2015-02-20 NOTE — Telephone Encounter (Signed)
-----   Message from Eulas Post, MD sent at 02/20/2015 10:33 AM EST ----- I am okay with her doing home B12 injections as long she has someone to do the injection. This would be very difficult to administer herself ----- Message -----    From: Conception Oms    Sent: 02/20/2015   9:07 AM      To: Eulas Post, MD  Dr Elease Hashimoto,  This patient is receiving a bill for $46.37 every time she comes here for a B12 injection.  She purchased the B12 and the needles but since we give the injection the billable amount is $46.37.  The patient is very upset that she is being billed for this. Would like for [place] to teach her how to give the injection but noted that you declined this and wanted her to have it done in the office.  Please advise.  ~Amanda

## 2015-03-10 ENCOUNTER — Ambulatory Visit (INDEPENDENT_AMBULATORY_CARE_PROVIDER_SITE_OTHER): Payer: 59

## 2015-03-10 ENCOUNTER — Telehealth: Payer: Self-pay

## 2015-03-10 DIAGNOSIS — E538 Deficiency of other specified B group vitamins: Secondary | ICD-10-CM | POA: Diagnosis not present

## 2015-03-10 MED ORDER — CYANOCOBALAMIN 1000 MCG/ML IJ SOLN
1000.0000 ug | Freq: Once | INTRAMUSCULAR | Status: AC
  Administered 2015-03-10: 1000 ug via INTRAMUSCULAR

## 2015-03-10 NOTE — Telephone Encounter (Signed)
OK to check B12 level by early December.

## 2015-03-10 NOTE — Telephone Encounter (Signed)
Pt states that she still feeling tired, she would like to know if she can get her blood drawn sooner than January

## 2015-03-11 NOTE — Telephone Encounter (Signed)
Spoke to pt she would let me when she can come for me to make an appt for her for her lab

## 2015-03-20 ENCOUNTER — Other Ambulatory Visit (INDEPENDENT_AMBULATORY_CARE_PROVIDER_SITE_OTHER): Payer: 59

## 2015-03-20 DIAGNOSIS — E538 Deficiency of other specified B group vitamins: Secondary | ICD-10-CM

## 2015-03-20 LAB — VITAMIN B12: VITAMIN B 12: 1049 pg/mL — AB (ref 211–911)

## 2015-04-07 ENCOUNTER — Encounter: Payer: Self-pay | Admitting: Family Medicine

## 2015-04-08 ENCOUNTER — Other Ambulatory Visit: Payer: Self-pay | Admitting: Family Medicine

## 2015-04-08 MED ORDER — CYCLOBENZAPRINE HCL 10 MG PO TABS: 10.0000 mg | ORAL_TABLET | Freq: Every day | ORAL | Status: DC

## 2015-04-13 ENCOUNTER — Encounter: Payer: Self-pay | Admitting: Family Medicine

## 2015-04-16 ENCOUNTER — Other Ambulatory Visit: Payer: Self-pay | Admitting: Family Medicine

## 2015-04-16 MED ORDER — PRAVASTATIN SODIUM 80 MG PO TABS: 80.0000 mg | ORAL_TABLET | Freq: Every day | ORAL | Status: DC

## 2015-04-16 MED ORDER — IMIPRAMINE HCL 10 MG PO TABS: 10.0000 mg | ORAL_TABLET | Freq: Every day | ORAL | Status: DC

## 2015-04-16 MED ORDER — ESTRADIOL-NORETHINDRONE ACET 0.05-0.14 MG/DAY TD PTTW: 1.0000 | MEDICATED_PATCH | TRANSDERMAL | Status: DC

## 2015-04-16 MED ORDER — PANTOPRAZOLE SODIUM 40 MG PO TBEC: 40.0000 mg | DELAYED_RELEASE_TABLET | Freq: Every day | ORAL | Status: DC

## 2015-04-16 MED ORDER — GABAPENTIN 600 MG PO TABS: ORAL_TABLET | ORAL | Status: DC

## 2015-04-22 ENCOUNTER — Other Ambulatory Visit: Payer: Self-pay | Admitting: Family Medicine

## 2015-04-22 MED ORDER — GABAPENTIN 600 MG PO TABS: ORAL_TABLET | ORAL | Status: DC

## 2015-04-22 NOTE — Telephone Encounter (Signed)
I verbally spoke with pt. I interpreted her first message as "go ahead and send in my maintenance medications and make sure gabapentin goes as well". I have sent in a 5 day supply of the gabapentin to her local [facility] due to her RX just being mailed yesterday. I apologized to her that I went ahead and sent in all of her medications. Pt did make me aware that her medication list was not correct. I have updated her medications.

## 2015-04-28 ENCOUNTER — Other Ambulatory Visit: Payer: Self-pay | Admitting: Family Medicine

## 2015-04-28 ENCOUNTER — Encounter: Payer: Self-pay | Admitting: Family Medicine

## 2015-04-28 MED ORDER — GABAPENTIN 600 MG PO TABS: ORAL_TABLET | ORAL | Status: DC

## 2015-06-03 ENCOUNTER — Encounter: Payer: Self-pay | Admitting: Family Medicine

## 2015-06-03 ENCOUNTER — Other Ambulatory Visit: Payer: Self-pay | Admitting: Family Medicine

## 2015-06-03 NOTE — Telephone Encounter (Signed)
Last refill 04/08/15 #30, 1rf. Last OV was 02/07/2015 No pending.

## 2015-06-04 ENCOUNTER — Other Ambulatory Visit: Payer: Self-pay | Admitting: Family Medicine

## 2015-06-04 MED ORDER — CYCLOBENZAPRINE HCL 10 MG PO TABS: 10.0000 mg | ORAL_TABLET | Freq: Every day | ORAL | Status: DC

## 2015-08-26 ENCOUNTER — Other Ambulatory Visit: Payer: Self-pay | Admitting: Family Medicine

## 2015-08-26 ENCOUNTER — Encounter: Payer: Self-pay | Admitting: Family Medicine

## 2015-08-26 MED ORDER — CYCLOBENZAPRINE HCL 10 MG PO TABS: 10.0000 mg | ORAL_TABLET | Freq: Every day | ORAL | Status: DC

## 2015-12-08 ENCOUNTER — Other Ambulatory Visit: Payer: Self-pay | Admitting: Family Medicine

## 2015-12-09 ENCOUNTER — Other Ambulatory Visit: Payer: Self-pay | Admitting: Family Medicine

## 2015-12-10 ENCOUNTER — Encounter: Payer: Self-pay | Admitting: Family Medicine

## 2015-12-10 MED ORDER — CYCLOBENZAPRINE HCL 10 MG PO TABS: 10.0000 mg | ORAL_TABLET | Freq: Every day | ORAL | 2 refills | Status: DC

## 2015-12-10 MED ORDER — PANTOPRAZOLE SODIUM 40 MG PO TBEC: 40.0000 mg | DELAYED_RELEASE_TABLET | Freq: Every day | ORAL | 2 refills | Status: DC

## 2015-12-10 MED ORDER — GABAPENTIN 600 MG PO TABS: ORAL_TABLET | ORAL | 2 refills | Status: DC

## 2016-01-21 ENCOUNTER — Ambulatory Visit (INDEPENDENT_AMBULATORY_CARE_PROVIDER_SITE_OTHER): Payer: BLUE CROSS/BLUE SHIELD | Admitting: Family Medicine

## 2016-01-21 VITALS — BP 130/90 | HR 103 | Temp 98.4°F | Ht 66.5 in | Wt 173.7 lb

## 2016-01-21 DIAGNOSIS — G43909 Migraine, unspecified, not intractable, without status migrainosus: Secondary | ICD-10-CM

## 2016-01-21 DIAGNOSIS — G44209 Tension-type headache, unspecified, not intractable: Secondary | ICD-10-CM | POA: Diagnosis not present

## 2016-01-21 MED ORDER — PREDNISONE 20 MG PO TABS: ORAL_TABLET | ORAL | 0 refills | Status: DC

## 2016-01-21 NOTE — Patient Instructions (Signed)
Let either me or Dr Melton Alar know in 2 days if headache not easing up.
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
## 2016-01-21 NOTE — Progress Notes (Signed)
Pre visit review using our clinic review tool, if applicable. No additional management support is needed unless otherwise documented below in the visit note. 

## 2016-01-25 ENCOUNTER — Encounter: Payer: Self-pay | Admitting: Family Medicine

## 2016-01-26 ENCOUNTER — Emergency Department (HOSPITAL_COMMUNITY): Payer: BLUE CROSS/BLUE SHIELD

## 2016-01-26 ENCOUNTER — Inpatient Hospital Stay (HOSPITAL_COMMUNITY)
Admission: EM | Admit: 2016-01-26 | Discharge: 2016-01-29 | DRG: 041 | Disposition: A | Discharge status: Home / Self Care | Payer: BLUE CROSS/BLUE SHIELD | Attending: Family Medicine | Admitting: Family Medicine

## 2016-01-26 ENCOUNTER — Encounter (HOSPITAL_COMMUNITY): Payer: Self-pay

## 2016-01-26 ENCOUNTER — Ambulatory Visit: Payer: BLUE CROSS/BLUE SHIELD | Admitting: Family Medicine

## 2016-01-26 DIAGNOSIS — M5030 Other cervical disc degeneration, unspecified cervical region: Secondary | ICD-10-CM | POA: Diagnosis present

## 2016-01-26 DIAGNOSIS — Z820 Family history of epilepsy and other diseases of the nervous system: Secondary | ICD-10-CM

## 2016-01-26 DIAGNOSIS — Z79890 Hormone replacement therapy (postmenopausal): Secondary | ICD-10-CM

## 2016-01-26 DIAGNOSIS — N179 Acute kidney failure, unspecified: Secondary | ICD-10-CM | POA: Diagnosis not present

## 2016-01-26 DIAGNOSIS — Z8249 Family history of ischemic heart disease and other diseases of the circulatory system: Secondary | ICD-10-CM

## 2016-01-26 DIAGNOSIS — E785 Hyperlipidemia, unspecified: Secondary | ICD-10-CM | POA: Diagnosis not present

## 2016-01-26 DIAGNOSIS — R51 Headache: Secondary | ICD-10-CM | POA: Diagnosis not present

## 2016-01-26 DIAGNOSIS — I63331 Cerebral infarction due to thrombosis of right posterior cerebral artery: Principal | ICD-10-CM | POA: Diagnosis present

## 2016-01-26 DIAGNOSIS — Z8261 Family history of arthritis: Secondary | ICD-10-CM

## 2016-01-26 DIAGNOSIS — M5136 Other intervertebral disc degeneration, lumbar region: Secondary | ICD-10-CM | POA: Diagnosis present

## 2016-01-26 DIAGNOSIS — R270 Ataxia, unspecified: Secondary | ICD-10-CM | POA: Diagnosis present

## 2016-01-26 DIAGNOSIS — I639 Cerebral infarction, unspecified: Secondary | ICD-10-CM | POA: Diagnosis present

## 2016-01-26 DIAGNOSIS — R739 Hyperglycemia, unspecified: Secondary | ICD-10-CM | POA: Diagnosis not present

## 2016-01-26 DIAGNOSIS — I129 Hypertensive chronic kidney disease with stage 1 through stage 4 chronic kidney disease, or unspecified chronic kidney disease: Secondary | ICD-10-CM | POA: Diagnosis present

## 2016-01-26 DIAGNOSIS — R000 Tachycardia, unspecified: Secondary | ICD-10-CM | POA: Diagnosis present

## 2016-01-26 DIAGNOSIS — Z823 Family history of stroke: Secondary | ICD-10-CM

## 2016-01-26 DIAGNOSIS — G459 Transient cerebral ischemic attack, unspecified: Secondary | ICD-10-CM

## 2016-01-26 DIAGNOSIS — E876 Hypokalemia: Secondary | ICD-10-CM | POA: Diagnosis present

## 2016-01-26 DIAGNOSIS — M1990 Unspecified osteoarthritis, unspecified site: Secondary | ICD-10-CM | POA: Diagnosis present

## 2016-01-26 DIAGNOSIS — N189 Chronic kidney disease, unspecified: Secondary | ICD-10-CM | POA: Diagnosis present

## 2016-01-26 DIAGNOSIS — G43909 Migraine, unspecified, not intractable, without status migrainosus: Secondary | ICD-10-CM | POA: Diagnosis present

## 2016-01-26 LAB — COMPREHENSIVE METABOLIC PANEL
ALT: 24 U/L (ref 14–54)
AST: 23 U/L (ref 15–41)
Albumin: 3.7 g/dL (ref 3.5–5.0)
Alkaline Phosphatase: 78 U/L (ref 38–126)
Anion gap: 9 (ref 5–15)
BUN: 16 mg/dL (ref 6–20)
CHLORIDE: 107 mmol/L (ref 101–111)
CO2: 25 mmol/L (ref 22–32)
CREATININE: 1.14 mg/dL — AB (ref 0.44–1.00)
Calcium: 9.4 mg/dL (ref 8.9–10.3)
GFR calc Af Amer: 58 mL/min — ABNORMAL LOW (ref >60)
GFR, EST NON AFRICAN AMERICAN: 50 mL/min — AB (ref >60)
Glucose, Bld: 119 mg/dL — ABNORMAL HIGH (ref 65–99)
Potassium: 3.1 mmol/L — ABNORMAL LOW (ref 3.5–5.1)
Sodium: 141 mmol/L (ref 135–145)
Total Bilirubin: 0.4 mg/dL (ref 0.3–1.2)
Total Protein: 6.4 g/dL — ABNORMAL LOW (ref 6.5–8.1)

## 2016-01-26 LAB — I-STAT CHEM 8, ED
BUN: 19 mg/dL (ref 6–20)
CREATININE: 1.1 mg/dL — AB (ref 0.44–1.00)
Calcium, Ion: 1.12 mmol/L — ABNORMAL LOW (ref 1.15–1.40)
Chloride: 105 mmol/L (ref 101–111)
Glucose, Bld: 113 mg/dL — ABNORMAL HIGH (ref 65–99)
HEMATOCRIT: 42 % (ref 36.0–46.0)
Hemoglobin: 14.3 g/dL (ref 12.0–15.0)
POTASSIUM: 3.2 mmol/L — AB (ref 3.5–5.1)
Sodium: 142 mmol/L (ref 135–145)
TCO2: 25 mmol/L (ref 0–100)

## 2016-01-26 LAB — CBC
HEMATOCRIT: 42.6 % (ref 36.0–46.0)
HEMOGLOBIN: 14.4 g/dL (ref 12.0–15.0)
MCH: 29.4 pg (ref 26.0–34.0)
MCHC: 33.8 g/dL (ref 30.0–36.0)
MCV: 86.9 fL (ref 78.0–100.0)
Platelets: 285 10*3/uL (ref 150–400)
RBC: 4.9 MIL/uL (ref 3.87–5.11)
RDW: 12.2 % (ref 11.5–15.5)
WBC: 13.7 10*3/uL — AB (ref 4.0–10.5)

## 2016-01-26 LAB — DIFFERENTIAL
BASOS ABS: 0 10*3/uL (ref 0.0–0.1)
BASOS PCT: 0 %
Eosinophils Absolute: 0.1 10*3/uL (ref 0.0–0.7)
Eosinophils Relative: 1 %
LYMPHS ABS: 5.2 10*3/uL — AB (ref 0.7–4.0)
Lymphocytes Relative: 38 %
MONOS PCT: 8 %
Monocytes Absolute: 1.1 10*3/uL — ABNORMAL HIGH (ref 0.1–1.0)
NEUTROS ABS: 7.3 10*3/uL (ref 1.7–7.7)
Neutrophils Relative %: 53 %

## 2016-01-26 LAB — GLUCOSE, CAPILLARY: Glucose-Capillary: 92 mg/dL (ref 65–99)

## 2016-01-26 LAB — I-STAT TROPONIN, ED: TROPONIN I, POC: 0.01 ng/mL (ref 0.00–0.08)

## 2016-01-26 LAB — APTT: APTT: 28 s (ref 24–36)

## 2016-01-26 LAB — CBG MONITORING, ED: Glucose-Capillary: 104 mg/dL — ABNORMAL HIGH (ref 65–99)

## 2016-01-26 LAB — PROTIME-INR
INR: 1.01
Prothrombin Time: 13.3 seconds (ref 11.4–15.2)

## 2016-01-26 MED ORDER — IMIPRAMINE HCL 10 MG PO TABS
10.0000 mg | ORAL_TABLET | Freq: Every day | ORAL | Status: DC
  Administered 2016-01-27 – 2016-01-28 (×2): 10 mg via ORAL
  Filled 2016-01-26 (×2): qty 1

## 2016-01-26 MED ORDER — STROKE: EARLY STAGES OF RECOVERY BOOK
Freq: Once | Status: AC
  Administered 2016-01-26: 22:00:00

## 2016-01-26 MED ORDER — ASPIRIN 325 MG PO TABS
325.0000 mg | ORAL_TABLET | Freq: Every day | ORAL | Status: DC
  Administered 2016-01-26 – 2016-01-29 (×4): 325 mg via ORAL
  Filled 2016-01-26 (×4): qty 1

## 2016-01-26 MED ORDER — GABAPENTIN 600 MG PO TABS
600.0000 mg | ORAL_TABLET | Freq: Two times a day (BID) | ORAL | Status: DC
  Administered 2016-01-27 – 2016-01-29 (×5): 600 mg via ORAL
  Filled 2016-01-26 (×7): qty 1

## 2016-01-26 MED ORDER — CYCLOBENZAPRINE HCL 10 MG PO TABS
10.0000 mg | ORAL_TABLET | Freq: Every day | ORAL | Status: DC
  Administered 2016-01-26 – 2016-01-28 (×3): 10 mg via ORAL
  Filled 2016-01-26 (×3): qty 1

## 2016-01-26 MED ORDER — SENNOSIDES-DOCUSATE SODIUM 8.6-50 MG PO TABS: 1.0000 | ORAL_TABLET | Freq: Every evening | ORAL | Status: DC | PRN

## 2016-01-26 MED ORDER — ACETAMINOPHEN 325 MG PO TABS
650.0000 mg | ORAL_TABLET | ORAL | Status: DC | PRN
  Administered 2016-01-27 – 2016-01-28 (×3): 650 mg via ORAL
  Filled 2016-01-26 (×3): qty 2

## 2016-01-26 MED ORDER — ASPIRIN 300 MG RE SUPP: 300.0000 mg | Freq: Every day | RECTAL | Status: DC

## 2016-01-26 MED ORDER — GABAPENTIN 600 MG PO TABS: 600.0000 mg | ORAL_TABLET | ORAL | Status: DC

## 2016-01-26 MED ORDER — ENOXAPARIN SODIUM 40 MG/0.4ML ~~LOC~~ SOLN
40.0000 mg | SUBCUTANEOUS | Status: DC
  Administered 2016-01-26 – 2016-01-28 (×3): 40 mg via SUBCUTANEOUS
  Filled 2016-01-26 (×3): qty 0.4

## 2016-01-26 MED ORDER — PANTOPRAZOLE SODIUM 40 MG PO TBEC
40.0000 mg | DELAYED_RELEASE_TABLET | Freq: Every day | ORAL | Status: DC
Start: 2016-01-26 — End: 2016-01-29
  Administered 2016-01-27 – 2016-01-29 (×3): 40 mg via ORAL
  Filled 2016-01-26 (×3): qty 1

## 2016-01-26 MED ORDER — ACETAMINOPHEN 650 MG RE SUPP: 650.0000 mg | RECTAL | Status: DC | PRN

## 2016-01-26 MED ORDER — SODIUM CHLORIDE 0.9 % IV SOLN
INTRAVENOUS | Status: DC
Start: 2016-01-26 — End: 2016-01-27
  Administered 2016-01-26: 21:00:00 via INTRAVENOUS

## 2016-01-26 MED ORDER — GABAPENTIN 600 MG PO TABS
1200.0000 mg | ORAL_TABLET | Freq: Every day | ORAL | Status: DC
  Administered 2016-01-26 – 2016-01-28 (×3): 1200 mg via ORAL
  Filled 2016-01-26 (×3): qty 2

## 2016-01-26 NOTE — Progress Notes (Signed)
Arrived from ED. Alert and oriented. Denies any pain. Safety measure in placed.
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
## 2016-01-26 NOTE — ED Notes (Signed)
Pt A&Ox 4 at this time. She does report being confused and disoriented earlier in the week, but not at this time. Pt does have some loss of vision in left peripheral field. Pt gait steady but does report weakness in bilateral legs this week and an episode where she stumbled and hit left side of glasses.

## 2016-01-26 NOTE — ED Triage Notes (Addendum)
Pt complaining of lightheadedness and dizziness x 1 week. Pt complaining of migraine x 5 days. Pt states hx of same. Pt states seen by neurologist, received steroids for migraine, told to come here to follow up CT/MRI to r/o stroke. Pt complaining of blurry vision x 1 week. Pt with unsteady gait at triage.

## 2016-01-26 NOTE — Telephone Encounter (Signed)
Pt called back to inquire about message.  Advised pt per dr Elease Hashimoto to follow up with neurologist.
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
## 2016-01-27 ENCOUNTER — Observation Stay (HOSPITAL_COMMUNITY): Payer: BLUE CROSS/BLUE SHIELD

## 2016-01-27 ENCOUNTER — Observation Stay (HOSPITAL_BASED_OUTPATIENT_CLINIC_OR_DEPARTMENT_OTHER): Payer: BLUE CROSS/BLUE SHIELD

## 2016-01-27 DIAGNOSIS — G43909 Migraine, unspecified, not intractable, without status migrainosus: Secondary | ICD-10-CM | POA: Diagnosis present

## 2016-01-27 DIAGNOSIS — M1990 Unspecified osteoarthritis, unspecified site: Secondary | ICD-10-CM | POA: Diagnosis present

## 2016-01-27 DIAGNOSIS — N179 Acute kidney failure, unspecified: Secondary | ICD-10-CM | POA: Diagnosis present

## 2016-01-27 DIAGNOSIS — E785 Hyperlipidemia, unspecified: Secondary | ICD-10-CM | POA: Diagnosis present

## 2016-01-27 DIAGNOSIS — I639 Cerebral infarction, unspecified: Secondary | ICD-10-CM | POA: Diagnosis not present

## 2016-01-27 DIAGNOSIS — Z8261 Family history of arthritis: Secondary | ICD-10-CM | POA: Diagnosis not present

## 2016-01-27 DIAGNOSIS — Z8249 Family history of ischemic heart disease and other diseases of the circulatory system: Secondary | ICD-10-CM | POA: Diagnosis not present

## 2016-01-27 DIAGNOSIS — I129 Hypertensive chronic kidney disease with stage 1 through stage 4 chronic kidney disease, or unspecified chronic kidney disease: Secondary | ICD-10-CM | POA: Diagnosis present

## 2016-01-27 DIAGNOSIS — E876 Hypokalemia: Secondary | ICD-10-CM | POA: Diagnosis not present

## 2016-01-27 DIAGNOSIS — I361 Nonrheumatic tricuspid (valve) insufficiency: Secondary | ICD-10-CM | POA: Diagnosis not present

## 2016-01-27 DIAGNOSIS — R739 Hyperglycemia, unspecified: Secondary | ICD-10-CM | POA: Diagnosis not present

## 2016-01-27 DIAGNOSIS — I63331 Cerebral infarction due to thrombosis of right posterior cerebral artery: Secondary | ICD-10-CM

## 2016-01-27 DIAGNOSIS — G459 Transient cerebral ischemic attack, unspecified: Secondary | ICD-10-CM | POA: Diagnosis not present

## 2016-01-27 DIAGNOSIS — M5030 Other cervical disc degeneration, unspecified cervical region: Secondary | ICD-10-CM | POA: Diagnosis present

## 2016-01-27 DIAGNOSIS — R51 Headache: Secondary | ICD-10-CM | POA: Diagnosis present

## 2016-01-27 DIAGNOSIS — R270 Ataxia, unspecified: Secondary | ICD-10-CM | POA: Diagnosis present

## 2016-01-27 DIAGNOSIS — M5136 Other intervertebral disc degeneration, lumbar region: Secondary | ICD-10-CM | POA: Diagnosis present

## 2016-01-27 DIAGNOSIS — N189 Chronic kidney disease, unspecified: Secondary | ICD-10-CM | POA: Diagnosis present

## 2016-01-27 DIAGNOSIS — R000 Tachycardia, unspecified: Secondary | ICD-10-CM | POA: Diagnosis present

## 2016-01-27 DIAGNOSIS — Z820 Family history of epilepsy and other diseases of the nervous system: Secondary | ICD-10-CM | POA: Diagnosis not present

## 2016-01-27 DIAGNOSIS — Z79890 Hormone replacement therapy (postmenopausal): Secondary | ICD-10-CM | POA: Diagnosis not present

## 2016-01-27 DIAGNOSIS — Z823 Family history of stroke: Secondary | ICD-10-CM | POA: Diagnosis not present

## 2016-01-27 DIAGNOSIS — I6300 Cerebral infarction due to thrombosis of unspecified precerebral artery: Secondary | ICD-10-CM | POA: Diagnosis not present

## 2016-01-27 LAB — LIPID PANEL
Cholesterol: 204 mg/dL — ABNORMAL HIGH (ref 0–200)
HDL: 45 mg/dL (ref >40)
LDL CALC: 96 mg/dL (ref 0–99)
TRIGLYCERIDES: 313 mg/dL — AB (ref <150)
Total CHOL/HDL Ratio: 4.5 RATIO
VLDL: 63 mg/dL — ABNORMAL HIGH (ref 0–40)

## 2016-01-27 LAB — GLUCOSE, CAPILLARY
GLUCOSE-CAPILLARY: 135 mg/dL — AB (ref 65–99)
GLUCOSE-CAPILLARY: 98 mg/dL (ref 65–99)
Glucose-Capillary: 81 mg/dL (ref 65–99)
Glucose-Capillary: 104 mg/dL — ABNORMAL HIGH (ref 65–99)

## 2016-01-27 LAB — RAPID URINE DRUG SCREEN, HOSP PERFORMED
AMPHETAMINES: NOT DETECTED
Barbiturates: NOT DETECTED
Benzodiazepines: NOT DETECTED
Cocaine: NOT DETECTED
OPIATES: NOT DETECTED
TETRAHYDROCANNABINOL: NOT DETECTED

## 2016-01-27 LAB — VAS US CAROTID
LCCADDIAS: -21 cm/s
LCCADSYS: -75 cm/s
LCCAPDIAS: 21 cm/s
LEFT ECA DIAS: -16 cm/s
LEFT VERTEBRAL DIAS: 12 cm/s
LICADDIAS: -31 cm/s
LICADSYS: -94 cm/s
LICAPDIAS: -18 cm/s
LICAPSYS: -57 cm/s
Left CCA prox sys: 94 cm/s
RCCAPSYS: 108 cm/s
RIGHT ECA DIAS: -11 cm/s
RIGHT VERTEBRAL DIAS: 13 cm/s
Right CCA prox dias: 20 cm/s
Right cca dist sys: -95 cm/s

## 2016-01-27 LAB — ECHOCARDIOGRAM COMPLETE
Height: 66 in
Weight: 2787 oz

## 2016-01-27 LAB — TSH: TSH: 3.3 u[IU]/mL (ref 0.350–4.500)

## 2016-01-27 MED ORDER — SUMATRIPTAN SUCCINATE 50 MG PO TABS
50.0000 mg | ORAL_TABLET | ORAL | Status: DC | PRN
  Administered 2016-01-27 (×2): 50 mg via ORAL
  Filled 2016-01-27 (×4): qty 1

## 2016-01-27 MED ORDER — ATORVASTATIN CALCIUM 40 MG PO TABS
40.0000 mg | ORAL_TABLET | Freq: Every day | ORAL | Status: DC
  Administered 2016-01-27 – 2016-01-28 (×2): 40 mg via ORAL
  Filled 2016-01-27 (×2): qty 1

## 2016-01-27 MED ORDER — POTASSIUM CHLORIDE CRYS ER 20 MEQ PO TBCR
40.0000 meq | EXTENDED_RELEASE_TABLET | Freq: Once | ORAL | Status: AC
  Administered 2016-01-27: 40 meq via ORAL
  Filled 2016-01-27: qty 2

## 2016-01-27 NOTE — Progress Notes (Addendum)
Occupational Therapy Treatment Patient Details Name: Andrea [name] MRN: [id number] DOB: [birth year] Today's Date: 01/27/2016    History of present illness Andrea [name] a 65 y.o.femalewith medical history significant of HLD, migraines presenting with concern for CVA. Patient started with a migraine about 10 days ago. MRI revealed moderate R PCA territory infarct.   OT comments  This 65 yo female admitted with above presents to acute OT with furthering testing to have a left visual field cut and inattention. She will continue to benefit from acute OT with follow up OPPT for vision. We have advised her not to drive until she has had a full visual field assessment test and been seen by OPPT--she verbalized understanding.  Follow Up Recommendations  Outpatient OT;Other (comment) (for vision)    Equipment Recommendations  None recommended by OT       Precautions / Restrictions Precautions Precautions: None Precaution Comments: headache Restrictions Weight Bearing Restrictions: No                    Vision Eye Alignment: Within Functional Limits Alignment/Gaze Preference: Within Defined Limits Ocular Range of Motion: Within Functional Limits Tracking/Visual Pursuits: Able to track stimulus in all quads without difficulty Saccades: Within functional limits Convergence: Within functional limits     Additional Comments: Right eye dominant, could have a visual inattention component as well. Letter cancellation test pt did miss "letters" as she was doing this          Cognition   Behavior During Therapy: WFL for tasks assessed/performed Overall Cognitive Status: Within Functional Limits for tasks assessed                                    Pertinent Vitals/ Pain       Pain Assessment: No/denies pain      Prior Functioning/Environment Level of Independence: Independent            Frequency  Min 3X/week        Progress Toward Goals  OT  Goals(current goals can now be found in the care plan section)  Progress towards OT goals: Progressing toward goals  Acute Rehab OT Goals Patient Stated Goal: keep the headache away OT Goal Formulation: With patient Time For Goal Achievement: 02/03/16 ADL Goals Additional ADL Goal #1: futher assess vision in busy environment  Plan Discharge plan needs to be updated          Activity Tolerance Patient tolerated treatment well   Patient Left in chair;with call bell/phone within reach   Nurse Communication  (feel pt safe to be up and about in room by herself)    Functional Assessment Tool Used: Clinical observation Functional Limitation: Self care Self Care Current Status [id number]): At least 1 percent but less than 20 percent impaired, limited or restricted Self Care Goal Status [id number]): 0 percent impaired, limited or restricted   Time: 1445-1505 OT Time Calculation (min): 20 min  Charges: OT G-codes **NOT FOR INPATIENT CLASS** Functional Assessment Tool Used: Clinical observation Functional Limitation: Self care Self Care Current Status [id number]): At least 1 percent but less than 20 percent impaired, limited or restricted Self Care Goal Status [id number]): 0 percent impaired, limited or restricted OT General Charges $OT Visit: 1 Procedure OT Evaluation $OT Eval Moderate Complexity: 1 Procedure OT Treatments $Therapeutic Activity: 8-22 mins  Almon Register [id number] 01/27/2016, 3:18 PM

## 2016-01-27 NOTE — Progress Notes (Signed)
  Echocardiogram 2D Echocardiogram has been performed.  Darlina Sicilian M 01/27/2016, 10:57 AM

## 2016-01-27 NOTE — Progress Notes (Signed)
PROGRESS NOTE    Andrea [name]  [id number] DOB: 1950-09-16 DOA: 01/26/2016 PCP: Eulas Post, MD   Outpatient Specialists:    Brief Narrative:  Andrea [name] is a 65 y.o. female with medical history significant of HLD, migraines presenting with concern for CVA.  Patient started with a migraine about 10 days ago.  She saw Dr. Elease Hashimoto on 10/11 and he started her on prednisone for 5 days for a persistent headache with a nonfocal exam.  It did help for 1-2 days but she would still have headaches at night.  Occipital and frontal headaches.  Difficulty focusing, ataxia, confusion developed as the week went on.  She knew something was wrong, concerned she had a tumor, was very anxious.  Unable to get in with neurology and so tried to get back in with Dr. Elease Hashimoto; he arranged for her to be seen by Dr. Catalina Gravel today.  Neurology said she could have had a stroke and the best thing was to come to the [facility] ER.    She is currently continuing to have occiptial headache.  Still hard to focus vision when she tries to "zoom in:".  Had nausea a week ago without vomiting, this has resolved.  No dysphagia, dysarthria.  B thigh shakiness, weakness.      Assessment & Plan:   Principal Problem:   CVA (cerebral vascular accident) ([place]) Active Problems:   Hyperlipidemia   Hormone replacement therapy (HRT)   Hypokalemia   Hyperglycemia   AKI (acute kidney injury) ([place])   CVA, Subacute, Medial right temporal lobe and portions of the right occipital lobe -MRI/MRA: Patchy moderate-sized right PCA territory infarct as above. No associated hemorrhage or mass effect. -Carotid dopplers -Echo FLP: LDL 96- statin added A1c -ASA daily (did not previously) -PT, OT, ST, nutrition consults  Elevated BP -Allow permissive HTN -Did not previously hold a diagnosis of HTN but is likely to have that diagnosis now -Is likely to need medications starting in 24-72 hours  HLD -Discontinue Pravachol and start  Lipitor 40 mg qhs for now  Hyperglycemia -No h/o DM -Mild hyperglycemia since admission -Check A1c  AKI -Mild -May actually progression of CKD -BMP in AM  HRT -Prempro held, likely needs to be discontinued as HRT is known to contribute to thromboembolic disease, including CVA- defer to neuro  migraines -resume maxalt  Hypokalemia -repleted -recheck in AM  DVT prophylaxis:  Lovenox   Code Status: Full Code   Family Communication: patient  Disposition Plan:     Consultants:   neuro      Subjective: C/o migraine headache  Objective: Vitals:   01/27/16 0030 01/27/16 0230 01/27/16 0434 01/27/16 0630  BP: 137/73 (!) 156/89 132/82 (!) 152/76  Pulse: 79 85 80 83  Resp: 18 16 16 16   Temp:   98.1 F (36.7 C) 98.8 F (37.1 C)  TempSrc:   Oral Oral  SpO2: 97% 99% 98% 99%  Weight:      Height:       No intake or output data in the 24 hours ending 01/27/16 1015 Filed Weights   01/26/16 1656  Weight: 79 kg (174 lb 3 oz)    Examination:  General exam: Appears calm and comfortable  Respiratory system: Clear to auscultation. Respiratory effort normal. Cardiovascular system: S1 & S2 heard, RRR. No JVD, murmurs, rubs, gallops or clicks. No pedal edema. Gastrointestinal system: Abdomen is nondistended, soft and nontender. No organomegaly or masses felt. Normal bowel sounds heard. Central nervous system: Alert and oriented.  No focal neurological deficits.    Data Reviewed: I have personally reviewed following labs and imaging studies  CBC:  Recent Labs Lab 01/26/16 1713 01/26/16 1728  WBC 13.7*  --   NEUTROABS 7.3  --   HGB 14.4 14.3  HCT 42.6 42.0  MCV 86.9  --   PLT 285  --    Basic Metabolic Panel:  Recent Labs Lab 01/26/16 1713 01/26/16 1728  NA 141 142  K 3.1* 3.2*  CL 107 105  CO2 25  --   GLUCOSE 119* 113*  BUN 16 19  CREATININE 1.14* 1.10*  CALCIUM 9.4  --    GFR: Estimated Creatinine Clearance: 54.8 mL/min (by C-G  formula based on SCr of 1.1 mg/dL (H)). Liver Function Tests:  Recent Labs Lab 01/26/16 1713  AST 23  ALT 24  ALKPHOS 78  BILITOT 0.4  PROT 6.4*  ALBUMIN 3.7   No results for input(s): LIPASE, AMYLASE in the last 168 hours. No results for input(s): AMMONIA in the last 168 hours. Coagulation Profile:  Recent Labs Lab 01/26/16 1713  INR 1.01   Cardiac Enzymes: No results for input(s): CKTOTAL, CKMB, CKMBINDEX, TROPONINI in the last 168 hours. BNP (last 3 results) No results for input(s): PROBNP in the last 8760 hours. HbA1C: No results for input(s): HGBA1C in the last 72 hours. CBG:  Recent Labs Lab 01/26/16 1839 01/26/16 2251 01/27/16 0635  GLUCAP 104* 92 81   Lipid Profile:  Recent Labs  01/27/16 0347  CHOL 204*  HDL 45  LDLCALC 96  TRIG 313*  CHOLHDL 4.5   Thyroid Function Tests:  Recent Labs  01/27/16 0347  TSH 3.300   Anemia Panel: No results for input(s): VITAMINB12, FOLATE, FERRITIN, TIBC, IRON, RETICCTPCT in the last 72 hours. Urine analysis:    Component Value Date/Time   BILIRUBINUR neg 04/09/2014 1146   PROTEINUR neg 04/09/2014 1146   UROBILINOGEN 0.2 04/09/2014 1146   NITRITE neg 04/09/2014 1146   LEUKOCYTESUR small (1+) 04/09/2014 1146     )No results found for this or any previous visit (from the past 240 hour(s)).    Anti-infectives    None       Radiology Studies: Ct Head Wo Contrast  Result Date: 01/26/2016 CLINICAL DATA:  Chronic migraine headaches. Acute onset of dizziness. Initial encounter. EXAM: CT HEAD WITHOUT CONTRAST TECHNIQUE: Contiguous axial images were obtained from the base of the skull through the vertex without intravenous contrast. COMPARISON:  None. FINDINGS: Brain: There appears to be an evolving subacute infarct involving the medial right temporal lobe and portions of the right occipital lobe. This extends mildly to the right side of the posterior commissure. There is no evidence of hemorrhagic  transformation. There is no evidence of hydrocephalus, extra-axial collection or mass lesion/mass effect. Prominence of the sulci suggests mild cortical volume loss. Mild periventricular and subcortical white matter change likely reflects small vessel ischemic microangiopathy. The brainstem and fourth ventricle are within normal limits. The basal ganglia are unremarkable in appearance. The cerebral hemispheres demonstrate grossly normal gray-white differentiation. No mass effect or midline shift is seen. Vascular: No hyperdense vessel or unexpected calcification. Skull: There is no evidence of fracture; visualized osseous structures are unremarkable in appearance. Sinuses/Orbits: The visualized portions of the orbits are within normal limits. The paranasal sinuses and mastoid air cells are well-aerated. Other: No significant soft tissue abnormalities are seen. IMPRESSION: 1. Apparent evolving subacute infarct involving the medial right temporal lobe and portions of the right occipital lobe.  This extends mildly to the right side of the posterior commissure. No evidence of hemorrhagic transformation. 2. Underlying mild cortical volume loss and scattered small vessel ischemic microangiopathy. These results were called by telephone at the time of interpretation on 01/26/2016 at 5:52 pm to Dr. Laverta Baltimore, who verbally acknowledged these results. Electronically Signed   By: Garald Balding M.D.   On: 01/26/2016 17:54   Mr Brain Wo Contrast  Result Date: 01/27/2016 CLINICAL DATA:  Initial evaluation for acute headache, ataxia. History of migraines. Concern for right PCA stroke on prior CT. The EXAM: MRI HEAD WITHOUT CONTRAST MRA HEAD WITHOUT CONTRAST TECHNIQUE: Multiplanar, multiecho pulse sequences of the brain and surrounding structures were obtained without intravenous contrast. Angiographic images of the head were obtained using MRA technique without contrast. COMPARISON:  Prior CT from 01/26/2016. FINDINGS: MRI HEAD  FINDINGS Brain: Diffuse prominence of the CSF containing spaces is compatible with generalized age-related cerebral atrophy. Patchy T2/FLAIR hyperintensity within the periventricular and deep white matter both cerebral hemispheres most consistent with chronic microvascular ischemic disease, mild for age. There is patchy abnormal restricted diffusion involving the mesial right temporal lobe, extending posteriorly into the right occipital lobe as well as the right aspect of the splenium. Findings compatible with acute right PCA territory infarct. Diffusion abnormality extends across the breadth of the splenium, however, associated T2/FLAIR signal abnormality appears confined largely to the right aspect of the splenium. No associated hemorrhage or mass effect. Patchy involvement within the right thalamus as well. No other acute infarct. Gray-white matter differentiation otherwise maintained. No evidence for acute or chronic intracranial hemorrhage. No other areas of chronic infarction identified. No mass lesion, midline shift or mass effect. No hydrocephalus. The no extra-axial fluid collection. Major dural sinuses are grossly patent. Pituitary gland normal. Vascular: Major intracranial vascular flow voids are maintained. Skull and upper cervical spine: Craniocervical junction normal. Visualized upper cervical spine demonstrates no acute abnormality. Bone marrow signal intensity within normal limits. No scalp soft tissue abnormality. Sinuses/Orbits: Globes and orbital soft tissues within normal limits. Mild scattered mucosal thickening within the ethmoidal air cells and maxillary sinuses. Paranasal sinuses are otherwise clear. No mastoid effusion. Inner ear structures grossly normal. MRA HEAD FINDINGS ANTERIOR CIRCULATION: Distal cervical segments of the internal carotid arteries are widely patent with antegrade flow. Petrous, cavernous, and supraclinoid segments are widely patent. A1 segments widely patent. Left A1  segment is hypoplastic. Anterior communicating artery normal. Anterior cerebral arteries well opacified. M1 segments patent without stenosis or occlusion. MCA bifurcations normal. Distal MCA branches well opacified and symmetric. POSTERIOR CIRCULATION: Vertebral arteries patent to the vertebrobasilar junction. Basilar artery somewhat diminutive but widely patent to its distal aspect. Superior cerebral arteries patent bilaterally. Fetal type left PCA supplied via a widely patent left posterior communicating artery. A small right posterior communicating artery present as well. There is abrupt occlusion of the right P2 segment proximally. No aneurysm or vascular malformation. IMPRESSION: MRI HEAD IMPRESSION: 1. Patchy moderate-sized right PCA territory infarct as above. No associated hemorrhage or mass effect. 2. Mild chronic microvascular ischemic disease. MRA HEAD IMPRESSION: 1. Proximal right P2 occlusion. 2. Otherwise widely patent vertebrobasilar system. 3. Patent anterior circulation. No high-grade or correctable stenosis within the intracranial circulation. Electronically Signed   By: Jeannine Boga M.D.   On: 01/27/2016 06:18   Mr Jodene Nam Head/brain [id number] Cm  Result Date: 01/27/2016 CLINICAL DATA:  Initial evaluation for acute headache, ataxia. History of migraines. Concern for right PCA stroke on prior CT. The EXAM: MRI  HEAD WITHOUT CONTRAST MRA HEAD WITHOUT CONTRAST TECHNIQUE: Multiplanar, multiecho pulse sequences of the brain and surrounding structures were obtained without intravenous contrast. Angiographic images of the head were obtained using MRA technique without contrast. COMPARISON:  Prior CT from 01/26/2016. FINDINGS: MRI HEAD FINDINGS Brain: Diffuse prominence of the CSF containing spaces is compatible with generalized age-related cerebral atrophy. Patchy T2/FLAIR hyperintensity within the periventricular and deep white matter both cerebral hemispheres most consistent with chronic microvascular  ischemic disease, mild for age. There is patchy abnormal restricted diffusion involving the mesial right temporal lobe, extending posteriorly into the right occipital lobe as well as the right aspect of the splenium. Findings compatible with acute right PCA territory infarct. Diffusion abnormality extends across the breadth of the splenium, however, associated T2/FLAIR signal abnormality appears confined largely to the right aspect of the splenium. No associated hemorrhage or mass effect. Patchy involvement within the right thalamus as well. No other acute infarct. Gray-white matter differentiation otherwise maintained. No evidence for acute or chronic intracranial hemorrhage. No other areas of chronic infarction identified. No mass lesion, midline shift or mass effect. No hydrocephalus. The no extra-axial fluid collection. Major dural sinuses are grossly patent. Pituitary gland normal. Vascular: Major intracranial vascular flow voids are maintained. Skull and upper cervical spine: Craniocervical junction normal. Visualized upper cervical spine demonstrates no acute abnormality. Bone marrow signal intensity within normal limits. No scalp soft tissue abnormality. Sinuses/Orbits: Globes and orbital soft tissues within normal limits. Mild scattered mucosal thickening within the ethmoidal air cells and maxillary sinuses. Paranasal sinuses are otherwise clear. No mastoid effusion. Inner ear structures grossly normal. MRA HEAD FINDINGS ANTERIOR CIRCULATION: Distal cervical segments of the internal carotid arteries are widely patent with antegrade flow. Petrous, cavernous, and supraclinoid segments are widely patent. A1 segments widely patent. Left A1 segment is hypoplastic. Anterior communicating artery normal. Anterior cerebral arteries well opacified. M1 segments patent without stenosis or occlusion. MCA bifurcations normal. Distal MCA branches well opacified and symmetric. POSTERIOR CIRCULATION: Vertebral arteries  patent to the vertebrobasilar junction. Basilar artery somewhat diminutive but widely patent to its distal aspect. Superior cerebral arteries patent bilaterally. Fetal type left PCA supplied via a widely patent left posterior communicating artery. A small right posterior communicating artery present as well. There is abrupt occlusion of the right P2 segment proximally. No aneurysm or vascular malformation. IMPRESSION: MRI HEAD IMPRESSION: 1. Patchy moderate-sized right PCA territory infarct as above. No associated hemorrhage or mass effect. 2. Mild chronic microvascular ischemic disease. MRA HEAD IMPRESSION: 1. Proximal right P2 occlusion. 2. Otherwise widely patent vertebrobasilar system. 3. Patent anterior circulation. No high-grade or correctable stenosis within the intracranial circulation. Electronically Signed   By: Jeannine Boga M.D.   On: 01/27/2016 06:18        Scheduled Meds: . aspirin  300 mg Rectal Daily   Or  . aspirin  325 mg Oral Daily  . atorvastatin  40 mg Oral q1800  . cyclobenzaprine  10 mg Oral QHS  . enoxaparin (LOVENOX) injection  40 mg Subcutaneous Q24H  . gabapentin  1,200 mg Oral QHS  . gabapentin  600 mg Oral BID WC  . imipramine  10 mg Oral QHS  . pantoprazole  40 mg Oral Daily   Continuous Infusions:    LOS: 0 days    Time spent: 25 min    Price, DO [facility] Pager [phone]  If 7PM-7AM, please contact night-coverage [email or web address] Password [email or web address] 01/27/2016, 10:15 AM
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
## 2016-01-27 NOTE — Progress Notes (Addendum)
STROKE TEAM PROGRESS NOTE   HISTORY OF PRESENT ILLNESS (per record) Andrea [name] is an 65 y.o. female history of migraine headaches, hyperlipidemia, and degenerative disc disease of lumbar and cervical spine, who presented to the emergency room with visual changes for about 10 days as well as unstable gait and headaches. She has not experienced focal weakness nor sensory changes involving face or extremities. Speech has remained unchanged. She is an avid reader and has had difficulty reading over the past 10 days. She's also become spatially disoriented on several occasions. CT scan of her head showed probable all living acute to subacute ischemic infarction involving the posterior temporal area and medial right occipital lobe. She has no previous history of stroke nor TIA. She has not been on antiplatelet therapy on a daily basis. She was last known well 01/16/2016, time unknown. Patient was not administered IV t-PA secondary to being beyond time window for treatment consideration. She was admitted for further evaluation and treatment.   SUBJECTIVE (INTERVAL HISTORY) Her husband is at the bedside.   Overall she feels her condition is stable. They recounted history of present illness with Dr. Leonie Man. Suspected stroke, initial onset at time of headache onset.   OBJECTIVE Temp:  [98 F (36.7 C)-98.8 F (37.1 C)] 98.7 F (37.1 C) (10/17 0900) Pulse Rate:  [79-108] 102 (10/17 0900) Cardiac Rhythm: Normal sinus rhythm (10/17 0700) Resp:  [16-18] 18 (10/17 0900) BP: (132-175)/(73-102) 144/90 (10/17 0900) SpO2:  [97 %-100 %] 99 % (10/17 0900) Weight:  [79 kg (174 lb 3 oz)] 79 kg (174 lb 3 oz) (10/16 1656)  CBC:  Recent Labs Lab 01/26/16 1713 01/26/16 1728  WBC 13.7*  --   NEUTROABS 7.3  --   HGB 14.4 14.3  HCT 42.6 42.0  MCV 86.9  --   PLT 285  --     Basic Metabolic Panel:  Recent Labs Lab 01/26/16 1713 01/26/16 1728  NA 141 142  K 3.1* 3.2*  CL 107 105  CO2 25  --   GLUCOSE  119* 113*  BUN 16 19  CREATININE 1.14* 1.10*  CALCIUM 9.4  --     Lipid Panel:    Component Value Date/Time   CHOL 204 (H) 01/27/2016 0347   TRIG 313 (H) 01/27/2016 0347   HDL 45 01/27/2016 0347   CHOLHDL 4.5 01/27/2016 0347   VLDL 63 (H) 01/27/2016 0347   LDLCALC 96 01/27/2016 0347   HgbA1c: No results found for: HGBA1C Urine Drug Screen:    Component Value Date/Time   LABOPIA NONE DETECTED 01/26/2016 0027   COCAINSCRNUR NONE DETECTED 01/26/2016 0027   LABBENZ NONE DETECTED 01/26/2016 0027   AMPHETMU NONE DETECTED 01/26/2016 0027   THCU NONE DETECTED 01/26/2016 0027   LABBARB NONE DETECTED 01/26/2016 0027      IMAGING  Ct Head Wo Contrast  Result Date: 01/26/2016 CLINICAL DATA:  Chronic migraine headaches. Acute onset of dizziness. Initial encounter. EXAM: CT HEAD WITHOUT CONTRAST TECHNIQUE: Contiguous axial images were obtained from the base of the skull through the vertex without intravenous contrast. COMPARISON:  None. FINDINGS: Brain: There appears to be an evolving subacute infarct involving the medial right temporal lobe and portions of the right occipital lobe. This extends mildly to the right side of the posterior commissure. There is no evidence of hemorrhagic transformation. There is no evidence of hydrocephalus, extra-axial collection or mass lesion/mass effect. Prominence of the sulci suggests mild cortical volume loss. Mild periventricular and subcortical white matter change likely reflects  small vessel ischemic microangiopathy. The brainstem and fourth ventricle are within normal limits. The basal ganglia are unremarkable in appearance. The cerebral hemispheres demonstrate grossly normal gray-white differentiation. No mass effect or midline shift is seen. Vascular: No hyperdense vessel or unexpected calcification. Skull: There is no evidence of fracture; visualized osseous structures are unremarkable in appearance. Sinuses/Orbits: The visualized portions of the orbits  are within normal limits. The paranasal sinuses and mastoid air cells are well-aerated. Other: No significant soft tissue abnormalities are seen. IMPRESSION: 1. Apparent evolving subacute infarct involving the medial right temporal lobe and portions of the right occipital lobe. This extends mildly to the right side of the posterior commissure. No evidence of hemorrhagic transformation. 2. Underlying mild cortical volume loss and scattered small vessel ischemic microangiopathy. These results were called by telephone at the time of interpretation on 01/26/2016 at 5:52 pm to Dr. Laverta Baltimore, who verbally acknowledged these results. Electronically Signed   By: Garald Balding M.D.   On: 01/26/2016 17:54   Mr Brain Wo Contrast  Result Date: 01/27/2016 CLINICAL DATA:  Initial evaluation for acute headache, ataxia. History of migraines. Concern for right PCA stroke on prior CT. The EXAM: MRI HEAD WITHOUT CONTRAST MRA HEAD WITHOUT CONTRAST TECHNIQUE: Multiplanar, multiecho pulse sequences of the brain and surrounding structures were obtained without intravenous contrast. Angiographic images of the head were obtained using MRA technique without contrast. COMPARISON:  Prior CT from 01/26/2016. FINDINGS: MRI HEAD FINDINGS Brain: Diffuse prominence of the CSF containing spaces is compatible with generalized age-related cerebral atrophy. Patchy T2/FLAIR hyperintensity within the periventricular and deep white matter both cerebral hemispheres most consistent with chronic microvascular ischemic disease, mild for age. There is patchy abnormal restricted diffusion involving the mesial right temporal lobe, extending posteriorly into the right occipital lobe as well as the right aspect of the splenium. Findings compatible with acute right PCA territory infarct. Diffusion abnormality extends across the breadth of the splenium, however, associated T2/FLAIR signal abnormality appears confined largely to the right aspect of the splenium. No  associated hemorrhage or mass effect. Patchy involvement within the right thalamus as well. No other acute infarct. Gray-white matter differentiation otherwise maintained. No evidence for acute or chronic intracranial hemorrhage. No other areas of chronic infarction identified. No mass lesion, midline shift or mass effect. No hydrocephalus. The no extra-axial fluid collection. Major dural sinuses are grossly patent. Pituitary gland normal. Vascular: Major intracranial vascular flow voids are maintained. Skull and upper cervical spine: Craniocervical junction normal. Visualized upper cervical spine demonstrates no acute abnormality. Bone marrow signal intensity within normal limits. No scalp soft tissue abnormality. Sinuses/Orbits: Globes and orbital soft tissues within normal limits. Mild scattered mucosal thickening within the ethmoidal air cells and maxillary sinuses. Paranasal sinuses are otherwise clear. No mastoid effusion. Inner ear structures grossly normal. MRA HEAD FINDINGS ANTERIOR CIRCULATION: Distal cervical segments of the internal carotid arteries are widely patent with antegrade flow. Petrous, cavernous, and supraclinoid segments are widely patent. A1 segments widely patent. Left A1 segment is hypoplastic. Anterior communicating artery normal. Anterior cerebral arteries well opacified. M1 segments patent without stenosis or occlusion. MCA bifurcations normal. Distal MCA branches well opacified and symmetric. POSTERIOR CIRCULATION: Vertebral arteries patent to the vertebrobasilar junction. Basilar artery somewhat diminutive but widely patent to its distal aspect. Superior cerebral arteries patent bilaterally. Fetal type left PCA supplied via a widely patent left posterior communicating artery. A small right posterior communicating artery present as well. There is abrupt occlusion of the right P2 segment proximally. No  aneurysm or vascular malformation. IMPRESSION: MRI HEAD IMPRESSION: 1. Patchy  moderate-sized right PCA territory infarct as above. No associated hemorrhage or mass effect. 2. Mild chronic microvascular ischemic disease. MRA HEAD IMPRESSION: 1. Proximal right P2 occlusion. 2. Otherwise widely patent vertebrobasilar system. 3. Patent anterior circulation. No high-grade or correctable stenosis within the intracranial circulation. Electronically Signed   By: Jeannine Boga M.D.   On: 01/27/2016 06:18   Mr Jodene Nam Head/brain [id number] Cm  Result Date: 01/27/2016 CLINICAL DATA:  Initial evaluation for acute headache, ataxia. History of migraines. Concern for right PCA stroke on prior CT. The EXAM: MRI HEAD WITHOUT CONTRAST MRA HEAD WITHOUT CONTRAST TECHNIQUE: Multiplanar, multiecho pulse sequences of the brain and surrounding structures were obtained without intravenous contrast. Angiographic images of the head were obtained using MRA technique without contrast. COMPARISON:  Prior CT from 01/26/2016. FINDINGS: MRI HEAD FINDINGS Brain: Diffuse prominence of the CSF containing spaces is compatible with generalized age-related cerebral atrophy. Patchy T2/FLAIR hyperintensity within the periventricular and deep white matter both cerebral hemispheres most consistent with chronic microvascular ischemic disease, mild for age. There is patchy abnormal restricted diffusion involving the mesial right temporal lobe, extending posteriorly into the right occipital lobe as well as the right aspect of the splenium. Findings compatible with acute right PCA territory infarct. Diffusion abnormality extends across the breadth of the splenium, however, associated T2/FLAIR signal abnormality appears confined largely to the right aspect of the splenium. No associated hemorrhage or mass effect. Patchy involvement within the right thalamus as well. No other acute infarct. Gray-white matter differentiation otherwise maintained. No evidence for acute or chronic intracranial hemorrhage. No other areas of chronic infarction  identified. No mass lesion, midline shift or mass effect. No hydrocephalus. The no extra-axial fluid collection. Major dural sinuses are grossly patent. Pituitary gland normal. Vascular: Major intracranial vascular flow voids are maintained. Skull and upper cervical spine: Craniocervical junction normal. Visualized upper cervical spine demonstrates no acute abnormality. Bone marrow signal intensity within normal limits. No scalp soft tissue abnormality. Sinuses/Orbits: Globes and orbital soft tissues within normal limits. Mild scattered mucosal thickening within the ethmoidal air cells and maxillary sinuses. Paranasal sinuses are otherwise clear. No mastoid effusion. Inner ear structures grossly normal. MRA HEAD FINDINGS ANTERIOR CIRCULATION: Distal cervical segments of the internal carotid arteries are widely patent with antegrade flow. Petrous, cavernous, and supraclinoid segments are widely patent. A1 segments widely patent. Left A1 segment is hypoplastic. Anterior communicating artery normal. Anterior cerebral arteries well opacified. M1 segments patent without stenosis or occlusion. MCA bifurcations normal. Distal MCA branches well opacified and symmetric. POSTERIOR CIRCULATION: Vertebral arteries patent to the vertebrobasilar junction. Basilar artery somewhat diminutive but widely patent to its distal aspect. Superior cerebral arteries patent bilaterally. Fetal type left PCA supplied via a widely patent left posterior communicating artery. A small right posterior communicating artery present as well. There is abrupt occlusion of the right P2 segment proximally. No aneurysm or vascular malformation. IMPRESSION: MRI HEAD IMPRESSION: 1. Patchy moderate-sized right PCA territory infarct as above. No associated hemorrhage or mass effect. 2. Mild chronic microvascular ischemic disease. MRA HEAD IMPRESSION: 1. Proximal right P2 occlusion. 2. Otherwise widely patent vertebrobasilar system. 3. Patent anterior  circulation. No high-grade or correctable stenosis within the intracranial circulation. Electronically Signed   By: Jeannine Boga M.D.   On: 01/27/2016 06:18   Carotid Doppler   There is 1-39% bilateral ICA stenosis. Vertebral artery flow is antegrade.    2D Echocardiogram  - Left ventricle: The cavity  size was normal. Systolic function was normal. The estimated ejection fraction was in the range of 60% to 65%. Wall motion was normal; there were no regional wall motion abnormalities. There was an increased relative contribution of atrial contraction to ventricular filling. Doppler parameters are consistent with abnormal left ventricular relaxation (grade 1 diastolic dysfunction). Doppler parameters are consistent with high ventricular filling pressure.   PHYSICAL EXAM Pleasant middle aged 82 lady not in distress.  . Afebrile. Head is nontraumatic. Neck is supple without bruit.    Cardiac exam no murmur or gallop. Lungs are clear to auscultation. Distal pulses are well felt. Neurological Exam ;  Awake  Alert oriented x 3. Normal speech and language.. Diminished recall 1/3. Partial left homonymous hemianopsia. eye movements full without nystagmus.fundi were not visualized. Vision acuity and fields appear normal. Hearing is normal. Palatal movements are normal. Face symmetric. Tongue midline. Normal strength, tone, reflexes and coordination. Normal sensation. Gait deferred.  ASSESSMENT/PLAN Ms. Lorree Yousaf is a 65 y.o. female with history of  migraine headaches, hyperlipidemia, and degenerative disc disease presenting with headache and visual changes. She did not receive IV t-PA due to delay in arrival.   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 Arranged with Alexis for Thursday at 10 AM.  If positive for PFO (patent foramen ovale), check bilateral lower extremity venous dopplers to rule out DVT as possible source of stroke. (I have made patient NPO after midnight).   If TEE negative, a [place] electrophysiologist will consult and consider placement of an implantable loop recorder to evaluate for atrial fibrillation as etiology of stroke. This has been explained to patient/family by Dr. Leonie Man and they are agreeable.   LDL 96  HgbA1c pending  Lovenox 40 mg sq daily for VTE prophylaxis  Diet Heart Room service appropriate? Yes; Fluid consistency: Thin  No antithrombotic prior to admission, now on aspirin 325 mg daily  Patient counseled to be compliant with her antithrombotic medications  Ongoing aggressive stroke risk factor management  Therapy recommendations:  No PT, OT at this time  Disposition:  Return home  No driving at this time  Elevated blood pressure  Stable  Permissive hypertension (OK if < 220/120) but gradually normalize in 5-7 days  Long-term BP goal normotensive  Hyperlipidemia  Home meds:  pravachol 80 mg daily  Changed to Lipitor 40 mg daily   LDL 96, goal < 70  Continue statin at discharge  Hyperglycemia  HgbA1c pending, goal < 7.0  Other Stroke Risk Factors  Obesity, Body mass index is 28.11 kg/m., recommend weight loss, diet and exercise as appropriate   Migraines - hx of episodic migraines. The last episode started 10 days ago. Different than usual to have so long. New symptoms (different than from prior headaches) of disorientation with imbalance. Reports banging into the wall. Feels she is having difficulty focusing to read on 10/9. No other vision changes noted  Other Active Problems  AKI  Menopause, on HRT prempro. Currently on hold. Globally, neurology recommends to stop. GYN had planned to stop  at age 12. Recommend discussion with GYN and cessation.   Hospital day # 0  BIBY,SHARON  [facility] See Amion for Pager information 01/27/2016 11:19 AM  I have personally examined this patient, reviewed notes, independently viewed imaging studies, participated in medical decision making and plan of care.ROS completed by me personally and pertinent positives fully documented  I have made any additions or clarifications directly to the above note. Agree with note above. She has presented with embolic right posterior cerebral artery infarct remains at risk for recurrent stroke. She needs ongoing stroke evaluation. Recommend transesophageal echocardiogram and loop recorder for paroxysmal atrial fibrillation. Long discussion with the patient and husband at the bedside and answered questions. Patient was advised not to drive till the peripheral vision improves. Greater than 50% time during this 35 minute visit was spent on counseling and coordination of care about stroke risk, prevention and treatment  Antony Contras, MD Medical Director [place] Pager: [phone] 01/27/2016 5:22 PM  To contact Stroke Continuity provider, please refer to [email or web address] After hours, contact General Neurology

## 2016-01-27 NOTE — Progress Notes (Signed)
*  PRELIMINARY RESULTS* Vascular Ultrasound Carotid Duplex (Doppler) has been completed.  Findings suggest 1-39% internal carotid artery stenosis bilaterally. Vertebral arteries are patent with antegrade flow.  01/27/2016 12:39 PM Maudry Mayhew, BS, RVT, RDCS, RDMS

## 2016-01-27 NOTE — Evaluation (Signed)
Occupational Therapy Evaluation Patient Details Name: Andrea [name] MRN: [id number] DOB: 1951/03/16 Today's Date: 01/27/2016    History of Present Illness Andrea [name] a 65 y.o.femalewith medical history significant of HLD, migraines presenting with concern for CVA. Patient started with a migraine about 10 days ago. MRI revealed moderate R PCA territory infarct.   Clinical Impression   This 65 yo female admitted with above presents to acute OT with questionable visual deficits, will continue to follow at least one more session for further assessment.    Follow Up Recommendations  No OT follow up;Supervision - Intermittent    Equipment Recommendations  None recommended by OT       Precautions / Restrictions Precautions Precautions: None Precaution Comments: headache      Mobility Bed Mobility Overal bed mobility: Independent                Transfers Overall transfer level: Independent                         ADL Overall ADL's : Independent                                             Vision Vision Assessment?: Yes Eye Alignment: Within Functional Limits Ocular Range of Motion: Within Functional Limits Alignment/Gaze Preference: Within Defined Limits Tracking/Visual Pursuits: Able to track stimulus in all quads without difficulty Saccades: Within functional limits Convergence: Within functional limits Visual Fields: No apparent deficits          Pertinent Vitals/Pain Pain Assessment: No/denies pain Pain Score: 9  Pain Location: "my head feels alot better" Pain Descriptors / Indicators: Headache Pain Intervention(s): Limited activity within patient's tolerance;Monitored during session;Patient requesting pain meds-RN notified     Hand Dominance Right   Extremity/Trunk Assessment Upper Extremity Assessment Upper Extremity Assessment: Overall WFL for tasks assessed           Communication  Communication Communication: No difficulties   Cognition Arousal/Alertness: Awake/alert Behavior During Therapy: WFL for tasks assessed/performed Overall Cognitive Status: Within Functional Limits for tasks assessed                                Home Living Family/patient expects to be discharged to:: Private residence Living Arrangements: Spouse/significant other Available Help at Discharge: Family;Available 24 hours/day Type of Home: House Home Access: Stairs to enter [employer] of Steps: 3 Entrance Stairs-Rails: None Home Layout: One level     Bathroom Shower/Tub: Occupational psychologist: Standard Bathroom Accessibility: Yes How Accessible: Accessible via wheelchair Home Equipment: None   Additional Comments: 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 impaired vision)   OT Treatment/Interventions: Self-care/ADL training;Visual/perceptual remediation/compensation    OT Goals(Current goals can be found in the care plan section) Acute Rehab OT Goals Patient Stated Goal: keep the headache away OT Goal Formulation: With patient Time For Goal Achievement: 02/03/16  OT Frequency: Min 2X/week              End of Session Nurse Communication:  (feel pt safe to be up  and about in room by herself)  Activity Tolerance: Patient tolerated treatment well Patient left: in chair;with call bell/phone within reach   Time: 1255-1317 OT Time Calculation (min): 22 min Charges:  OT General Charges $OT Visit: 1 Procedure OT Evaluation $OT Eval Moderate Complexity: 1 Procedure G-Codes: OT G-codes **NOT FOR INPATIENT CLASS** Functional Assessment Tool Used: Clinical observation Functional Limitation: Self care Self Care Current Status [id number]): At least 1 percent but less than 20 percent  impaired, limited or restricted Self Care Goal Status [id number]): 0 percent impaired, limited or restricted  Almon Register [id number] 01/27/2016, 1:33 PM

## 2016-01-27 NOTE — Progress Notes (Signed)
Nutrition Education Note  RD consulted for CVA.   65 y.o.femalewith medical history significant of HLD, migraines presenting with concern for CVA.   Pt states that her appetite has been good and she has been eating normally. She denies any unintentional weight loss recently. She is agreeable to heart healthy eating.   Lipid Panel     Component Value Date/Time   CHOL 204 (H) 01/27/2016 0347   TRIG 313 (H) 01/27/2016 0347   HDL 45 01/27/2016 0347   CHOLHDL 4.5 01/27/2016 0347   VLDL 63 (H) 01/27/2016 0347   LDLCALC 96 01/27/2016 0347    RD provided "High Triglyceride Nutrition Therapy" handout from the Academy of [employer]. Reviewed patient's dietary recall. She usually eats a bowl of cereal for breakfast, a bologna sandwich for lunch, and a lean cuisine TV dinner at night.  Provided examples on ways to decrease sodium and fat intake in diet. Encouraged fresh fruits and vegetables as well as whole grain sources of carbohydrates to maximize fiber intake. Teach back method used. She states that she loves fruits, eats vegetables most days, and drinks mostly water. She feels that she could eat less bread and more vegetables; otherwise she feels that she eats fairly healthy. She states that she walks her dog daily, but otherwise exercise is limited due to limited mobility s/p spinal/back surgery.   Expect good compliance.  Body mass index is 28.11 kg/m. Pt meets criteria for Overweight based on current BMI.  Current diet order is Heart Healthy, patient is consuming approximately 75% of meals at this time. Labs and medications reviewed. No further nutrition interventions warranted at this time. RD contact information provided. If additional nutrition issues arise, please re-consult RD.  Scarlette Ar RD, CSP, LDN Inpatient Clinical Dietitian Pager: [phone] After Hours Pager: [phone]

## 2016-01-27 NOTE — Evaluation (Signed)
Physical Therapy Evaluation Patient Details Name: Andrea [name] MRN: [id number] DOB: [birth year] Today's Date: 01/27/2016   History of Present Illness  Andrea [name] a 65 y.o.femalewith medical history significant of HLD, migraines presenting with concern for CVA. Patient started with a migraine about 10 days ago. MRI revealed R PCA territory infarct.  Clinical Impression  Pt admitted with above. Pt reports vision to have improved and return to normal but con't to have 9/10 headache causing mild unsteadiness and slow, guarded gait. Suspect once headache under control pt will return to baseline from functional mobility stand point.    Follow Up Recommendations No PT follow up;Supervision/Assistance - 24 hour    Equipment Recommendations  None recommended by PT    Recommendations for Other Services       Precautions / Restrictions Precautions Precautions: Fall Precaution Comments: headache Restrictions Weight Bearing Restrictions: No      Mobility  Bed Mobility Overal bed mobility: Independent                Transfers Overall transfer level: Needs assistance Equipment used: None Transfers: Sit to/from Stand Sit to Stand: Supervision         General transfer comment: no instability  Ambulation/Gait Ambulation/Gait assistance: Min guard Ambulation Distance (Feet): 150 Feet Assistive device: None Gait Pattern/deviations: Step-through pattern Gait velocity: slow and guarded Gait velocity interpretation: Below normal speed for age/gender General Gait Details: pt slow and guarded, pt squinting to read signs but reports her vision to be back to normal  Stairs Stairs: Yes Stairs assistance: Min guard Stair Management: No rails;Alternating pattern Number of Stairs: 5 General stair comments: no episodes of LOB  Wheelchair Mobility    Modified Rankin (Stroke Patients Only) Modified Rankin (Stroke Patients Only) Pre-Morbid Rankin Score: No  symptoms Modified Rankin: Slight disability     Balance                                 Standardized Balance Assessment Standardized Balance Assessment : Dynamic Gait Index   Dynamic Gait Index Level Surface: Mild Impairment Change in Gait Speed: Mild Impairment Gait with Horizontal Head Turns: Mild Impairment Gait with Vertical Head Turns: Mild Impairment Gait and Pivot Turn: Mild Impairment Step Over Obstacle: Mild Impairment Step Around Obstacles: Mild Impairment Steps: Mild Impairment Total Score: 16       Pertinent Vitals/Pain Pain Assessment: 0-10 Pain Score: 9  Pain Location: headache, Pain Descriptors / Indicators: Headache Pain Intervention(s): Limited activity within patient's tolerance;Monitored during session;Patient requesting pain meds-RN notified    Home Living Family/patient expects to be discharged to:: Private residence Living Arrangements: Spouse/significant other Available Help at Discharge: Family;Available 24 hours/day Type of Home: House Home Access: Stairs to enter Entrance Stairs-Rails: None Entrance Stairs-Number of Steps: 3 Home Layout: One level Home Equipment: None Additional Comments: 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    Prior Function Level of Independence: Independent               Hand Dominance   Dominant Hand: Right    Extremity/Trunk Assessment   Upper Extremity Assessment: Overall WFL for tasks assessed           Lower Extremity Assessment: Overall WFL for tasks assessed      Cervical / Trunk Assessment: Normal  Communication   Communication: No difficulties  Cognition Arousal/Alertness: Awake/alert Behavior During Therapy: WFL for tasks assessed/performed Overall Cognitive Status: Within  Functional Limits for tasks assessed                      General Comments      Exercises     Assessment/Plan    PT Assessment Patient needs continued PT  services  PT Problem List Decreased strength;Decreased activity tolerance;Decreased balance;Decreased mobility;Decreased coordination;Decreased knowledge of use of DME;Pain          PT Treatment Interventions DME instruction;Gait training;Stair training;Functional mobility training;Therapeutic activities;Balance training;Therapeutic exercise;Neuromuscular re-education    PT Goals (Current goals can be found in the Care Plan section)  Acute Rehab PT Goals Patient Stated Goal: stop the headache PT Goal Formulation: With patient Time For Goal Achievement: 02/03/16 Potential to Achieve Goals: Good Additional Goals Additional Goal #1: Pt to score >49 on Berg to indicate minimal falls risk.    Frequency Min 3X/week   Barriers to discharge        Co-evaluation               End of Session Equipment Utilized During Treatment: Gait belt Activity Tolerance: Patient tolerated treatment well Patient left: in chair;with call bell/phone within reach Nurse Communication: Mobility status    Functional Assessment Tool Used: clinical judgement Functional Limitation: Mobility: Walking and moving around Mobility: Walking and Moving Around Current Status [id number]): At least 1 percent but less than 20 percent impaired, limited or restricted Mobility: Walking and Moving Around Goal Status [phone]): At least 1 percent but less than 20 percent impaired, limited or restricted    Time: 0800-0821 PT Time Calculation (min) (ACUTE ONLY): 21 min   Charges:   PT Evaluation $PT Eval Moderate Complexity: 1 Procedure     PT G Codes:   PT G-Codes **NOT FOR INPATIENT CLASS** Functional Assessment Tool Used: clinical judgement Functional Limitation: Mobility: Walking and moving around Mobility: Walking and Moving Around Current Status [id number]): At least 1 percent but less than 20 percent impaired, limited or restricted Mobility: Walking and Moving Around Goal Status [phone]): At least 1 percent but less  than 20 percent impaired, limited or restricted    Kingsley Callander 01/27/2016, 11:18 AM  Kittie Plater, PT, DPT Pager #: [phone] Office #: [phone]

## 2016-01-28 DIAGNOSIS — I6300 Cerebral infarction due to thrombosis of unspecified precerebral artery: Secondary | ICD-10-CM

## 2016-01-28 LAB — BASIC METABOLIC PANEL
ANION GAP: 10 (ref 5–15)
BUN: 11 mg/dL (ref 6–20)
CALCIUM: 9.2 mg/dL (ref 8.9–10.3)
CO2: 26 mmol/L (ref 22–32)
CREATININE: 1.04 mg/dL — AB (ref 0.44–1.00)
Chloride: 104 mmol/L (ref 101–111)
GFR, EST NON AFRICAN AMERICAN: 56 mL/min — AB (ref >60)
Glucose, Bld: 90 mg/dL (ref 65–99)
Potassium: 4.2 mmol/L (ref 3.5–5.1)
SODIUM: 140 mmol/L (ref 135–145)

## 2016-01-28 LAB — GLUCOSE, CAPILLARY
GLUCOSE-CAPILLARY: 79 mg/dL (ref 65–99)
Glucose-Capillary: 110 mg/dL — ABNORMAL HIGH (ref 65–99)

## 2016-01-28 LAB — HEMOGLOBIN A1C
HEMOGLOBIN A1C: 5.4 % (ref 4.8–5.6)
Mean Plasma Glucose: 108 mg/dL

## 2016-01-28 MED ORDER — KETOROLAC TROMETHAMINE 15 MG/ML IJ SOLN
15.0000 mg | Freq: Once | INTRAMUSCULAR | Status: AC
  Administered 2016-01-28: 15 mg via INTRAVENOUS
  Filled 2016-01-28: qty 1

## 2016-01-28 MED ORDER — DIPHENHYDRAMINE HCL 50 MG/ML IJ SOLN
12.5000 mg | Freq: Once | INTRAMUSCULAR | Status: AC
  Administered 2016-01-28: 12.5 mg via INTRAVENOUS
  Filled 2016-01-28: qty 1

## 2016-01-28 MED ORDER — PROCHLORPERAZINE EDISYLATE 5 MG/ML IJ SOLN
10.0000 mg | Freq: Once | INTRAMUSCULAR | Status: AC
  Administered 2016-01-28: 10 mg via INTRAVENOUS
  Filled 2016-01-28: qty 2

## 2016-01-28 NOTE — Progress Notes (Signed)
Physical Therapy Treatment and Discharge Patient Details Name: Andrea [name] MRN: [id number] DOB: 1950/05/02 Today's Date: 01/28/2016    History of Present Illness Andrea [name] a 65 y.o.femalewith medical history significant of HLD, migraines presenting with concern for CVA. Patient started with a migraine about 10 days ago. MRI revealed moderate R PCA territory infarct.    PT Comments    Pt functioning at baseline. Pt has met all mobility goals and is independent with all mobility. Pt at minimal falls risk as noted by DGI of 24/24, much improved from yesterday. Pt with no further acute PT needs at this time. Please reconsult if needed in future. PT SIGNING OFF.  Follow Up Recommendations  No PT follow up     Equipment Recommendations  None recommended by PT    Recommendations for Other Services       Precautions / Restrictions Precautions Precautions: None Restrictions Weight Bearing Restrictions: No    Mobility  Bed Mobility Overal bed mobility: Independent                Transfers Overall transfer level: Independent   Transfers: Sit to/from Stand Sit to Stand: Independent         General transfer comment: wfl  Ambulation/Gait Ambulation/Gait assistance: Independent Ambulation Distance (Feet): 300 Feet Assistive device: None Gait Pattern/deviations: WFL(Within Functional Limits)   Gait velocity interpretation: at or above normal speed for age/gender     Stairs Stairs: Yes Stairs assistance: Supervision Stair Management: No rails;Alternating pattern;Forwards Number of Stairs: 12 General stair comments: wfl, no episodes of LOB  Wheelchair Mobility    Modified Rankin (Stroke Patients Only) Modified Rankin (Stroke Patients Only) Pre-Morbid Rankin Score: No symptoms Modified Rankin: No significant disability     Balance Overall balance assessment: No apparent balance deficits (not formally assessed)                                   Cognition Arousal/Alertness: Awake/alert Behavior During Therapy: WFL for tasks assessed/performed Overall Cognitive Status: Within Functional Limits for tasks assessed                      Exercises      General Comments        Pertinent Vitals/Pain Pain Assessment: 0-10 Pain Score: 4  Pain Location: lingering headache Pain Intervention(s): Monitored during session    Home Living                      Prior Function            PT Goals (current goals can now be found in the care plan section) Progress towards PT goals: Goals met/education completed, patient discharged from PT    Frequency           PT Plan  (pt has met all goals)    Co-evaluation             End of Session Equipment Utilized During Treatment: Gait belt Activity Tolerance: Patient tolerated treatment well Patient left: in bed;with call bell/phone within reach     Time: 0918-0932 PT Time Calculation (min) (ACUTE ONLY): 14 min  Charges:  $Gait Training: 8-22 mins                    G Codes:      Kingsley Callander 01/28/2016, 2:05 PM   Kittie Plater, PT, DPT Pager #:  [phone] Office #: [phone]

## 2016-01-28 NOTE — Progress Notes (Signed)
STROKE TEAM PROGRESS NOTE   SUBJECTIVE (INTERVAL HISTORY) Patient lying in the bed. Andrea [name] feels her vision is improved. Denies problems seeing anything. Improving based on exam as well. Andrea [name] and he discussed stroke etiologies.   OBJECTIVE Temp:  [97.9 F (36.6 C)-98.9 F (37.2 C)] 98.4 F (36.9 C) (10/18 0557) Pulse Rate:  [82-97] 87 (10/18 0557) Cardiac Rhythm: Sinus tachycardia (10/17 1900) Resp:  [17-19] 19 (10/18 0557) BP: (134-166)/(68-81) 158/75 (10/18 0557) SpO2:  [99 %-100 %] 100 % (10/18 0557)  CBC:   Recent Labs Lab 01/26/16 1713 01/26/16 1728  WBC 13.7*  --   NEUTROABS 7.3  --   HGB 14.4 14.3  HCT 42.6 42.0  MCV 86.9  --   PLT 285  --     Basic Metabolic Panel:   Recent Labs Lab 01/26/16 1713 01/26/16 1728 01/28/16 0418  NA 141 142 140  K 3.1* 3.2* 4.2  CL 107 105 104  CO2 25  --  26  GLUCOSE 119* 113* 90  BUN 16 19 11   CREATININE 1.14* 1.10* 1.04*  CALCIUM 9.4  --  9.2    Lipid Panel:     Component Value Date/Time   CHOL 204 (H) 01/27/2016 0347   TRIG 313 (H) 01/27/2016 0347   HDL 45 01/27/2016 0347   CHOLHDL 4.5 01/27/2016 0347   VLDL 63 (H) 01/27/2016 0347   LDLCALC 96 01/27/2016 0347   HgbA1c:  Lab Results  Component Value Date   HGBA1C 5.4 01/27/2016   Urine Drug Screen:     Component Value Date/Time   LABOPIA NONE DETECTED 01/26/2016 0027   COCAINSCRNUR NONE DETECTED 01/26/2016 0027   LABBENZ NONE DETECTED 01/26/2016 0027   AMPHETMU NONE DETECTED 01/26/2016 0027   THCU NONE DETECTED 01/26/2016 0027   LABBARB NONE DETECTED 01/26/2016 0027      IMAGING  Ct Head Wo Contrast  Result Date: 01/26/2016 CLINICAL DATA:  Chronic migraine headaches. Acute onset of dizziness. Initial encounter. EXAM: CT HEAD WITHOUT CONTRAST TECHNIQUE: Contiguous axial images were obtained from the base of the skull through the vertex without intravenous contrast. COMPARISON:  None. FINDINGS: Brain: There appears to be an evolving subacute infarct  involving the medial right temporal lobe and portions of the right occipital lobe. This extends mildly to the right side of the posterior commissure. There is no evidence of hemorrhagic transformation. There is no evidence of hydrocephalus, extra-axial collection or mass lesion/mass effect. Prominence of the sulci suggests mild cortical volume loss. Mild periventricular and subcortical white matter change likely reflects small vessel ischemic microangiopathy. The brainstem and fourth ventricle are within normal limits. The basal ganglia are unremarkable in appearance. The cerebral hemispheres demonstrate grossly normal gray-white differentiation. No mass effect or midline shift is seen. Vascular: No hyperdense vessel or unexpected calcification. Skull: There is no evidence of fracture; visualized osseous structures are unremarkable in appearance. Sinuses/Orbits: The visualized portions of the orbits are within normal limits. The paranasal sinuses and mastoid air cells are well-aerated. Other: No significant soft tissue abnormalities are seen. IMPRESSION: 1. Apparent evolving subacute infarct involving the medial right temporal lobe and portions of the right occipital lobe. This extends mildly to the right side of the posterior commissure. No evidence of hemorrhagic transformation. 2. Underlying mild cortical volume loss and scattered small vessel ischemic microangiopathy. These results were called by telephone at the time of interpretation on 01/26/2016 at 5:52 pm to Dr. Laverta Baltimore, who verbally acknowledged these results. Electronically Signed   By: Garald Balding  M.D.   On: 01/26/2016 17:54   Mr Brain Wo Contrast  Result Date: 01/27/2016 CLINICAL DATA:  Initial evaluation for acute headache, ataxia. History of migraines. Concern for right PCA stroke on prior CT. The EXAM: MRI HEAD WITHOUT CONTRAST MRA HEAD WITHOUT CONTRAST TECHNIQUE: Multiplanar, multiecho pulse sequences of the brain and surrounding structures  were obtained without intravenous contrast. Angiographic images of the head were obtained using MRA technique without contrast. COMPARISON:  Prior CT from 01/26/2016. FINDINGS: MRI HEAD FINDINGS Brain: Diffuse prominence of the CSF containing spaces is compatible with generalized age-related cerebral atrophy. Patchy T2/FLAIR hyperintensity within the periventricular and deep white matter both cerebral hemispheres most consistent with chronic microvascular ischemic disease, mild for age. There is patchy abnormal restricted diffusion involving the mesial right temporal lobe, extending posteriorly into the right occipital lobe as well as the right aspect of the splenium. Findings compatible with acute right PCA territory infarct. Diffusion abnormality extends across the breadth of the splenium, however, associated T2/FLAIR signal abnormality appears confined largely to the right aspect of the splenium. No associated hemorrhage or mass effect. Patchy involvement within the right thalamus as well. No other acute infarct. Gray-white matter differentiation otherwise maintained. No evidence for acute or chronic intracranial hemorrhage. No other areas of chronic infarction identified. No mass lesion, midline shift or mass effect. No hydrocephalus. The no extra-axial fluid collection. Major dural sinuses are grossly patent. Pituitary gland normal. Vascular: Major intracranial vascular flow voids are maintained. Skull and upper cervical spine: Craniocervical junction normal. Visualized upper cervical spine demonstrates no acute abnormality. Bone marrow signal intensity within normal limits. No scalp soft tissue abnormality. Sinuses/Orbits: Globes and orbital soft tissues within normal limits. Mild scattered mucosal thickening within the ethmoidal air cells and maxillary sinuses. Paranasal sinuses are otherwise clear. No mastoid effusion. Inner ear structures grossly normal. MRA HEAD FINDINGS ANTERIOR CIRCULATION: Distal  cervical segments of the internal carotid arteries are widely patent with antegrade flow. Petrous, cavernous, and supraclinoid segments are widely patent. A1 segments widely patent. Left A1 segment is hypoplastic. Anterior communicating artery normal. Anterior cerebral arteries well opacified. M1 segments patent without stenosis or occlusion. MCA bifurcations normal. Distal MCA branches well opacified and symmetric. POSTERIOR CIRCULATION: Vertebral arteries patent to the vertebrobasilar junction. Basilar artery somewhat diminutive but widely patent to its distal aspect. Superior cerebral arteries patent bilaterally. Fetal type left PCA supplied via a widely patent left posterior communicating artery. A small right posterior communicating artery present as well. There is abrupt occlusion of the right P2 segment proximally. No aneurysm or vascular malformation. IMPRESSION: MRI HEAD IMPRESSION: 1. Patchy moderate-sized right PCA territory infarct as above. No associated hemorrhage or mass effect. 2. Mild chronic microvascular ischemic disease. MRA HEAD IMPRESSION: 1. Proximal right P2 occlusion. 2. Otherwise widely patent vertebrobasilar system. 3. Patent anterior circulation. No high-grade or correctable stenosis within the intracranial circulation. Electronically Signed   By: Jeannine Boga M.D.   On: 01/27/2016 06:18   Mr Jodene Nam Head/brain [id number] Cm  Result Date: 01/27/2016 CLINICAL DATA:  Initial evaluation for acute headache, ataxia. History of migraines. Concern for right PCA stroke on prior CT. The EXAM: MRI HEAD WITHOUT CONTRAST MRA HEAD WITHOUT CONTRAST TECHNIQUE: Multiplanar, multiecho pulse sequences of the brain and surrounding structures were obtained without intravenous contrast. Angiographic images of the head were obtained using MRA technique without contrast. COMPARISON:  Prior CT from 01/26/2016. FINDINGS: MRI HEAD FINDINGS Brain: Diffuse prominence of the CSF containing spaces is compatible with  generalized age-related cerebral  atrophy. Patchy T2/FLAIR hyperintensity within the periventricular and deep white matter both cerebral hemispheres most consistent with chronic microvascular ischemic disease, mild for age. There is patchy abnormal restricted diffusion involving the mesial right temporal lobe, extending posteriorly into the right occipital lobe as well as the right aspect of the splenium. Findings compatible with acute right PCA territory infarct. Diffusion abnormality extends across the breadth of the splenium, however, associated T2/FLAIR signal abnormality appears confined largely to the right aspect of the splenium. No associated hemorrhage or mass effect. Patchy involvement within the right thalamus as well. No other acute infarct. Gray-white matter differentiation otherwise maintained. No evidence for acute or chronic intracranial hemorrhage. No other areas of chronic infarction identified. No mass lesion, midline shift or mass effect. No hydrocephalus. The no extra-axial fluid collection. Major dural sinuses are grossly patent. Pituitary gland normal. Vascular: Major intracranial vascular flow voids are maintained. Skull and upper cervical spine: Craniocervical junction normal. Visualized upper cervical spine demonstrates no acute abnormality. Bone marrow signal intensity within normal limits. No scalp soft tissue abnormality. Sinuses/Orbits: Globes and orbital soft tissues within normal limits. Mild scattered mucosal thickening within the ethmoidal air cells and maxillary sinuses. Paranasal sinuses are otherwise clear. No mastoid effusion. Inner ear structures grossly normal. MRA HEAD FINDINGS ANTERIOR CIRCULATION: Distal cervical segments of the internal carotid arteries are widely patent with antegrade flow. Petrous, cavernous, and supraclinoid segments are widely patent. A1 segments widely patent. Left A1 segment is hypoplastic. Anterior communicating artery normal. Anterior cerebral  arteries well opacified. M1 segments patent without stenosis or occlusion. MCA bifurcations normal. Distal MCA branches well opacified and symmetric. POSTERIOR CIRCULATION: Vertebral arteries patent to the vertebrobasilar junction. Basilar artery somewhat diminutive but widely patent to its distal aspect. Superior cerebral arteries patent bilaterally. Fetal type left PCA supplied via a widely patent left posterior communicating artery. A small right posterior communicating artery present as well. There is abrupt occlusion of the right P2 segment proximally. No aneurysm or vascular malformation. IMPRESSION: MRI HEAD IMPRESSION: 1. Patchy moderate-sized right PCA territory infarct as above. No associated hemorrhage or mass effect. 2. Mild chronic microvascular ischemic disease. MRA HEAD IMPRESSION: 1. Proximal right P2 occlusion. 2. Otherwise widely patent vertebrobasilar system. 3. Patent anterior circulation. No high-grade or correctable stenosis within the intracranial circulation. Electronically Signed   By: Jeannine Boga M.D.   On: 01/27/2016 06:18   Carotid Doppler   There is 1-39% bilateral ICA stenosis. Vertebral artery flow is antegrade.    2D Echocardiogram  - Left ventricle: The cavity size was normal. Systolic function was normal. The estimated ejection fraction was in the range of 60% to 65%. Wall motion was normal; there were no regional wall motion abnormalities. There was an increased relative contribution of atrial contraction to ventricular filling. Doppler parameters are consistent with abnormal left ventricular relaxation (grade 1 diastolic dysfunction). Doppler parameters are consistent with high ventricular filling pressure.   PHYSICAL EXAM Pleasant middle aged 28 lady not in distress.  . Afebrile. Head is nontraumatic. Neck is supple without bruit.    Cardiac exam no murmur or gallop. Lungs are clear to auscultation. Distal pulses are well felt. Neurological Exam ;   Awake  Alert oriented x 3. Normal speech and language.. Diminished recall 1/3. Partial left homonymous hemianopsia. eye movements full without nystagmus.fundi were not visualized. Vision acuity and fields appear normal. Hearing is normal. Palatal movements are normal. Face symmetric. Tongue midline. Normal strength, tone, reflexes and coordination. Normal sensation. Gait deferred.  ASSESSMENT/PLAN  Andrea [name] is a 65 y.o. female with history of  migraine headaches, hyperlipidemia, and degenerative disc disease presenting with headache and visual changes. Andrea [name] did not receive IV t-PA due to delay in arrival.   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 Arranged with [facility] for Thursday at 10 AM.  If positive for PFO (patent foramen ovale), check bilateral lower extremity venous dopplers to rule out DVT as possible source of stroke. (I have made patient NPO after midnight).   If TEE negative, a [facility] electrophysiologist will consult and consider placement of an implantable loop recorder to evaluate for atrial fibrillation as etiology of stroke. This has been explained to patient/family by Dr. Leonie Man and they are agreeable.   LDL 96  HgbA1c 5.4  Lovenox 40 mg sq daily for VTE prophylaxis Diet Heart Room service appropriate? Yes; Fluid consistency: Thin Diet NPO time specified  No antithrombotic prior to admission, now on aspirin 325 mg daily  Patient counseled to be compliant with her antithrombotic medications  Ongoing aggressive stroke risk factor management  Therapy recommendations:  No PT, OP OT  Disposition:  Return home  No driving at this time  [facility] for  discharge once workup completed  Follow up with Dr. Leonie Man in 6 weeks, order placed.  Elevated blood pressure  Stable Permissive hypertension (OK if < 220/120) but gradually normalize in 5-7 days Long-term BP goal normotensive  Hyperlipidemia  Home meds:  pravachol 80 mg daily  Changed to Lipitor 40 mg daily   LDL 96, goal < 70  Continue statin at discharge  Hyperglycemia  HgbA1c pending, goal < 7.0  Other Stroke Risk Factors  Obesity, Body mass index is 28.11 kg/m., recommend weight loss, diet and exercise as appropriate   Migraines - hx of episodic migraines. The last episode started 10 days ago. Different than usual to have so long. New symptoms (different than from prior headaches) of disorientation with imbalance. Reports banging into the wall. Feels Andrea [name] is having difficulty focusing to read on 10/9. No other vision changes noted  Other Active Problems  AKI  Menopause, on HRT prempro. Currently on hold. Globally, neurology recommends to stop. GYN had planned to stop at age 74. Recommend discussion with GYN and cessation.   Hospital day # [place] for Pager information 01/28/2016 9:53 AM  I have personally examined this patient, reviewed notes, independently viewed imaging studies, participated in medical decision making and plan of care.ROS completed by me personally and pertinent positives fully documented  I have made any additions or clarifications directly to the above note. Agree with note above. Andrea [name] has presented with embolic right posterior cerebral artery infarct remains at risk for recurrent stroke. Andrea [name] needs ongoing stroke evaluation. Recommend transesophageal echocardiogram and loop recorder for paroxysmal atrial fibrillation scheduled for tomorrow. Antony Contras, MD Medical Director [facility] Pager: [phone] 01/28/2016 9:53 AM  To contact Stroke Continuity provider, please refer to  [email or web address] After hours, contact General Neurology

## 2016-01-28 NOTE — Progress Notes (Signed)
    [facility] has been requested to perform a transesophageal echocardiogram on Andrea [name] for cerebrovascular accident.  After careful review of history and examination, the risks and benefits of transesophageal echocardiogram have been explained including risks of esophageal damage, perforation (1:10,000 risk), bleeding, pharyngeal hematoma as well as other potential complications associated with conscious sedation including aspiration, arrhythmia, respiratory failure and death. Alternatives to treatment were discussed, questions were answered. Patient is willing to proceed.   Hgb stable at 14.3, platelets 285. BP stable without requirement for pressors. On the schedule for 01/29/2016 at 1000.  Erma Heritage, [place]  01/28/2016 2:55 PM

## 2016-01-28 NOTE — Progress Notes (Signed)
PROGRESS NOTE    Andrea [name]  [id number] DOB: 21-Mar-1951 DOA: 01/26/2016 PCP: Eulas Post, MD   Outpatient Specialists:    Brief Narrative:  Andrea [name] is a 65 y.o. female with medical history significant of HLD, migraines presenting with concern for CVA.  Patient started with a migraine about 10 days ago.  She saw Dr. Elease Hashimoto on 10/11 and he started her on prednisone for 5 days for a persistent headache with a nonfocal exam.  It did help for 1-2 days but she would still have headaches at night.  Occipital and frontal headaches.  Difficulty focusing, ataxia, confusion developed as the week went on.  She knew something was wrong, concerned she had a tumor, was very anxious.  Unable to get in with neurology and so tried to get back in with Dr. Elease Hashimoto; he arranged for her to be seen by Dr. Catalina Gravel today.  Neurology said she could have had a stroke and the best thing was to come to the [facility] ER.     Assessment & Plan:   Principal Problem:   CVA (cerebral vascular accident) ([place]) Active Problems:   Hyperlipidemia   Hormone replacement therapy (HRT)   Hypokalemia   Hyperglycemia   AKI (acute kidney injury) ([place])   CVA, Subacute, Medial right temporal lobe and portions of the right occipital lobe -MRI/MRA: Patchy moderate-sized right PCA territory infarct as above. No associated hemorrhage or mass effect. -Carotid dopplers ok -Echo done FLP: LDL 96- statin added A1c 5.4 -ASA daily (did not previously) -PT, OT, STdone -no driving -Plan for TEE/LOOP in AM-- d/c after  Elevated BP -Allow permissive HTN -Did not previously hold a diagnosis of HTN but is likely to have that diagnosis now -may need medications starting in 24-72 hours  HLD -Discontinue Pravachol and start Lipitor 40 mg qhs for now  Hyperglycemia -No h/o DM -Mild hyperglycemia since admission -Check A1c  AKI -Mild -improving  HRT -Prempro held, likely needs to be discontinued as HRT is  known to contribute to thromboembolic disease  migraines -headache cocktail  Hypokalemia -repleted -recheck in AM  DVT prophylaxis:  Lovenox   Code Status: Full Code   Family Communication: patient  Disposition Plan:     Consultants:   neuro      Subjective: C/o migraine headache  Objective: Vitals:   01/27/16 2138 01/28/16 0123 01/28/16 0557 01/28/16 1015  BP: 134/71 (!) 166/72 (!) 158/75 135/67  Pulse: 92 82 87 96  Resp: 17 18 19 18   Temp: 98.9 F (37.2 C) 97.9 F (36.6 C) 98.4 F (36.9 C) 98.3 F (36.8 C)  TempSrc: Oral Oral Oral Oral  SpO2: 99% 100% 100% 100%  Weight:      Height:       No intake or output data in the 24 hours ending 01/28/16 1311 Filed Weights   01/26/16 1656  Weight: 79 kg (174 lb 3 oz)    Examination:  General exam: Appears calm and comfortable  Respiratory system: Clear to auscultation. Respiratory effort normal. Cardiovascular system: S1 & S2 heard, RRR. No JVD, murmurs, rubs, gallops or clicks. No pedal edema. Gastrointestinal system: Abdomen is nondistended, soft and nontender. No organomegaly or masses felt. Normal bowel sounds heard. Central nervous system: Alert and oriented. No focal neurological deficits.    Data Reviewed: I have personally reviewed following labs and imaging studies  CBC:  Recent Labs Lab 01/26/16 1713 01/26/16 1728  WBC 13.7*  --   NEUTROABS 7.3  --   HGB  14.4 14.3  HCT 42.6 42.0  MCV 86.9  --   PLT 285  --    Basic Metabolic Panel:  Recent Labs Lab 01/26/16 1713 01/26/16 1728 01/28/16 0418  NA 141 142 140  K 3.1* 3.2* 4.2  CL 107 105 104  CO2 25  --  26  GLUCOSE 119* 113* 90  BUN 16 19 11   CREATININE 1.14* 1.10* 1.04*  CALCIUM 9.4  --  9.2   GFR: Estimated Creatinine Clearance: 58 mL/min (by C-G formula based on SCr of 1.04 mg/dL (H)). Liver Function Tests:  Recent Labs Lab 01/26/16 1713  AST 23  ALT 24  ALKPHOS 78  BILITOT 0.4  PROT 6.4*  ALBUMIN 3.7    No results for input(s): LIPASE, AMYLASE in the last 168 hours. No results for input(s): AMMONIA in the last 168 hours. Coagulation Profile:  Recent Labs Lab 01/26/16 1713  INR 1.01   Cardiac Enzymes: No results for input(s): CKTOTAL, CKMB, CKMBINDEX, TROPONINI in the last 168 hours. BNP (last 3 results) No results for input(s): PROBNP in the last 8760 hours. HbA1C:  Recent Labs  01/27/16 0347  HGBA1C 5.4   CBG:  Recent Labs Lab 01/27/16 1133 01/27/16 1621 01/27/16 2137 01/28/16 0646 01/28/16 1140  GLUCAP 135* 98 104* 79 110*   Lipid Profile:  Recent Labs  01/27/16 0347  CHOL 204*  HDL 45  LDLCALC 96  TRIG 313*  CHOLHDL 4.5   Thyroid Function Tests:  Recent Labs  01/27/16 0347  TSH 3.300   Anemia Panel: No results for input(s): VITAMINB12, FOLATE, FERRITIN, TIBC, IRON, RETICCTPCT in the last 72 hours. Urine analysis:    Component Value Date/Time   BILIRUBINUR neg 04/09/2014 1146   PROTEINUR neg 04/09/2014 1146   UROBILINOGEN 0.2 04/09/2014 1146   NITRITE neg 04/09/2014 1146   LEUKOCYTESUR small (1+) 04/09/2014 1146     )No results found for this or any previous visit (from the past 240 hour(s)).    Anti-infectives    None       Radiology Studies: Ct Head Wo Contrast  Result Date: 01/26/2016 CLINICAL DATA:  Chronic migraine headaches. Acute onset of dizziness. Initial encounter. EXAM: CT HEAD WITHOUT CONTRAST TECHNIQUE: Contiguous axial images were obtained from the base of the skull through the vertex without intravenous contrast. COMPARISON:  None. FINDINGS: Brain: There appears to be an evolving subacute infarct involving the medial right temporal lobe and portions of the right occipital lobe. This extends mildly to the right side of the posterior commissure. There is no evidence of hemorrhagic transformation. There is no evidence of hydrocephalus, extra-axial collection or mass lesion/mass effect. Prominence of the sulci suggests mild  cortical volume loss. Mild periventricular and subcortical white matter change likely reflects small vessel ischemic microangiopathy. The brainstem and fourth ventricle are within normal limits. The basal ganglia are unremarkable in appearance. The cerebral hemispheres demonstrate grossly normal gray-white differentiation. No mass effect or midline shift is seen. Vascular: No hyperdense vessel or unexpected calcification. Skull: There is no evidence of fracture; visualized osseous structures are unremarkable in appearance. Sinuses/Orbits: The visualized portions of the orbits are within normal limits. The paranasal sinuses and mastoid air cells are well-aerated. Other: No significant soft tissue abnormalities are seen. IMPRESSION: 1. Apparent evolving subacute infarct involving the medial right temporal lobe and portions of the right occipital lobe. This extends mildly to the right side of the posterior commissure. No evidence of hemorrhagic transformation. 2. Underlying mild cortical volume loss and scattered small  vessel ischemic microangiopathy. These results were called by telephone at the time of interpretation on 01/26/2016 at 5:52 pm to Dr. Laverta Baltimore, who verbally acknowledged these results. Electronically Signed   By: Garald Balding M.D.   On: 01/26/2016 17:54   Mr Brain Wo Contrast  Result Date: 01/27/2016 CLINICAL DATA:  Initial evaluation for acute headache, ataxia. History of migraines. Concern for right PCA stroke on prior CT. The EXAM: MRI HEAD WITHOUT CONTRAST MRA HEAD WITHOUT CONTRAST TECHNIQUE: Multiplanar, multiecho pulse sequences of the brain and surrounding structures were obtained without intravenous contrast. Angiographic images of the head were obtained using MRA technique without contrast. COMPARISON:  Prior CT from 01/26/2016. FINDINGS: MRI HEAD FINDINGS Brain: Diffuse prominence of the CSF containing spaces is compatible with generalized age-related cerebral atrophy. Patchy T2/FLAIR  hyperintensity within the periventricular and deep white matter both cerebral hemispheres most consistent with chronic microvascular ischemic disease, mild for age. There is patchy abnormal restricted diffusion involving the mesial right temporal lobe, extending posteriorly into the right occipital lobe as well as the right aspect of the splenium. Findings compatible with acute right PCA territory infarct. Diffusion abnormality extends across the breadth of the splenium, however, associated T2/FLAIR signal abnormality appears confined largely to the right aspect of the splenium. No associated hemorrhage or mass effect. Patchy involvement within the right thalamus as well. No other acute infarct. Gray-white matter differentiation otherwise maintained. No evidence for acute or chronic intracranial hemorrhage. No other areas of chronic infarction identified. No mass lesion, midline shift or mass effect. No hydrocephalus. The no extra-axial fluid collection. Major dural sinuses are grossly patent. Pituitary gland normal. Vascular: Major intracranial vascular flow voids are maintained. Skull and upper cervical spine: Craniocervical junction normal. Visualized upper cervical spine demonstrates no acute abnormality. Bone marrow signal intensity within normal limits. No scalp soft tissue abnormality. Sinuses/Orbits: Globes and orbital soft tissues within normal limits. Mild scattered mucosal thickening within the ethmoidal air cells and maxillary sinuses. Paranasal sinuses are otherwise clear. No mastoid effusion. Inner ear structures grossly normal. MRA HEAD FINDINGS ANTERIOR CIRCULATION: Distal cervical segments of the internal carotid arteries are widely patent with antegrade flow. Petrous, cavernous, and supraclinoid segments are widely patent. A1 segments widely patent. Left A1 segment is hypoplastic. Anterior communicating artery normal. Anterior cerebral arteries well opacified. M1 segments patent without stenosis or  occlusion. MCA bifurcations normal. Distal MCA branches well opacified and symmetric. POSTERIOR CIRCULATION: Vertebral arteries patent to the vertebrobasilar junction. Basilar artery somewhat diminutive but widely patent to its distal aspect. Superior cerebral arteries patent bilaterally. Fetal type left PCA supplied via a widely patent left posterior communicating artery. A small right posterior communicating artery present as well. There is abrupt occlusion of the right P2 segment proximally. No aneurysm or vascular malformation. IMPRESSION: MRI HEAD IMPRESSION: 1. Patchy moderate-sized right PCA territory infarct as above. No associated hemorrhage or mass effect. 2. Mild chronic microvascular ischemic disease. MRA HEAD IMPRESSION: 1. Proximal right P2 occlusion. 2. Otherwise widely patent vertebrobasilar system. 3. Patent anterior circulation. No high-grade or correctable stenosis within the intracranial circulation. Electronically Signed   By: Jeannine Boga M.D.   On: 01/27/2016 06:18   Mr Jodene Nam Head/brain [id number] Cm  Result Date: 01/27/2016 CLINICAL DATA:  Initial evaluation for acute headache, ataxia. History of migraines. Concern for right PCA stroke on prior CT. The EXAM: MRI HEAD WITHOUT CONTRAST MRA HEAD WITHOUT CONTRAST TECHNIQUE: Multiplanar, multiecho pulse sequences of the brain and surrounding structures were obtained without intravenous contrast. Angiographic images  of the head were obtained using MRA technique without contrast. COMPARISON:  Prior CT from 01/26/2016. FINDINGS: MRI HEAD FINDINGS Brain: Diffuse prominence of the CSF containing spaces is compatible with generalized age-related cerebral atrophy. Patchy T2/FLAIR hyperintensity within the periventricular and deep white matter both cerebral hemispheres most consistent with chronic microvascular ischemic disease, mild for age. There is patchy abnormal restricted diffusion involving the mesial right temporal lobe, extending posteriorly  into the right occipital lobe as well as the right aspect of the splenium. Findings compatible with acute right PCA territory infarct. Diffusion abnormality extends across the breadth of the splenium, however, associated T2/FLAIR signal abnormality appears confined largely to the right aspect of the splenium. No associated hemorrhage or mass effect. Patchy involvement within the right thalamus as well. No other acute infarct. Gray-white matter differentiation otherwise maintained. No evidence for acute or chronic intracranial hemorrhage. No other areas of chronic infarction identified. No mass lesion, midline shift or mass effect. No hydrocephalus. The no extra-axial fluid collection. Major dural sinuses are grossly patent. Pituitary gland normal. Vascular: Major intracranial vascular flow voids are maintained. Skull and upper cervical spine: Craniocervical junction normal. Visualized upper cervical spine demonstrates no acute abnormality. Bone marrow signal intensity within normal limits. No scalp soft tissue abnormality. Sinuses/Orbits: Globes and orbital soft tissues within normal limits. Mild scattered mucosal thickening within the ethmoidal air cells and maxillary sinuses. Paranasal sinuses are otherwise clear. No mastoid effusion. Inner ear structures grossly normal. MRA HEAD FINDINGS ANTERIOR CIRCULATION: Distal cervical segments of the internal carotid arteries are widely patent with antegrade flow. Petrous, cavernous, and supraclinoid segments are widely patent. A1 segments widely patent. Left A1 segment is hypoplastic. Anterior communicating artery normal. Anterior cerebral arteries well opacified. M1 segments patent without stenosis or occlusion. MCA bifurcations normal. Distal MCA branches well opacified and symmetric. POSTERIOR CIRCULATION: Vertebral arteries patent to the vertebrobasilar junction. Basilar artery somewhat diminutive but widely patent to its distal aspect. Superior cerebral arteries  patent bilaterally. Fetal type left PCA supplied via a widely patent left posterior communicating artery. A small right posterior communicating artery present as well. There is abrupt occlusion of the right P2 segment proximally. No aneurysm or vascular malformation. IMPRESSION: MRI HEAD IMPRESSION: 1. Patchy moderate-sized right PCA territory infarct as above. No associated hemorrhage or mass effect. 2. Mild chronic microvascular ischemic disease. MRA HEAD IMPRESSION: 1. Proximal right P2 occlusion. 2. Otherwise widely patent vertebrobasilar system. 3. Patent anterior circulation. No high-grade or correctable stenosis within the intracranial circulation. Electronically Signed   By: Jeannine Boga M.D.   On: 01/27/2016 06:18        Scheduled Meds: . aspirin  300 mg Rectal Daily   Or  . aspirin  325 mg Oral Daily  . atorvastatin  40 mg Oral q1800  . cyclobenzaprine  10 mg Oral QHS  . diphenhydrAMINE  12.5 mg Intravenous Once  . enoxaparin (LOVENOX) injection  40 mg Subcutaneous Q24H  . gabapentin  1,200 mg Oral QHS  . gabapentin  600 mg Oral BID WC  . imipramine  10 mg Oral QHS  . ketorolac  15 mg Intravenous Once  . pantoprazole  40 mg Oral Daily  . prochlorperazine  10 mg Intravenous Once   Continuous Infusions:    LOS: 1 day    Time spent: 25 min    Sierra Brooks, DO [facility] Pager [phone]  If 7PM-7AM, please contact night-coverage [email or web address] Password [email or web address] 01/28/2016, 1:11 PM

## 2016-01-28 NOTE — Progress Notes (Signed)
Occupational Therapy Treatment Patient Details Name: Andrea [name] MRN: [id number] DOB: 15-Nov-1950 Today's Date: 01/28/2016    History of present illness Andrea [name] a 65 y.o.femalewith medical history significant of HLD, migraines presenting with concern for CVA. Patient started with a migraine about 10 days ago. MRI revealed moderate R PCA territory infarct.   OT comments  Pt continues to present with superior L quadrant visual deficits. Educated pt and husband on visual compensatory strategies, safety strategies for visual changes, and visual scanning exercises; they verbalize understanding. Reviewed recommendation for follow up with eye doctor and follow up outpatient neuro OT. D/c plan remains appropriate. Will continue to follow acutely.   Follow Up Recommendations  Outpatient OT;Other (comment) (neuro for vision)    Equipment Recommendations  None recommended by OT    Recommendations for Other Services      Precautions / Restrictions Precautions Precautions: None Restrictions Weight Bearing Restrictions: No       Mobility Bed Mobility Overal bed mobility: Independent             General bed mobility comments: Not assessed  Transfers Overall transfer level: Independent   Transfers: Sit to/from Stand Sit to Stand: Independent         General transfer comment: wfl    Balance Overall balance assessment: No apparent balance deficits (not formally assessed)                                 ADL                                                Vision                     Perception     Praxis      Cognition   Behavior During Therapy: [facility] for tasks assessed/performed Overall Cognitive Status: Within Functional Limits for tasks assessed                       Extremity/Trunk Assessment               Exercises Other Exercises Other Exercises: Educated pt and husband on importance and role  of follow up with eye doctor and outpatient neuro OT. Other Exercises: Educated pt on safety strategies for visual deficits (scanning environment, redcuing fall hazards, high contrast/increased lighting, no driving).  Other Exercises: Educated pt and husband on visual scanning activities (targets on wall, large surface area newspaper, identifying objects in L visual field).   Shoulder Instructions       General Comments      Pertinent Vitals/ Pain       Pain Assessment: Faces Pain Score: 4  Faces Pain Scale: Hurts a little bit Pain Location: head Pain Descriptors / Indicators: Headache Pain Intervention(s): Monitored during session  Home Living                                          Prior Functioning/Environment              Frequency  Min 3X/week        Progress Toward Goals  OT Goals(current goals can now be  found in the care plan section)  Progress towards OT goals: Progressing toward goals  Acute Rehab OT Goals Patient Stated Goal: return home after test tomorrow OT Goal Formulation: With patient  Plan Discharge plan remains appropriate    Co-evaluation                 End of Session     Activity Tolerance Patient tolerated treatment well   Patient Left with family/visitor present   Nurse Communication          Time: [id number] OT Time Calculation (min): 27 min  Charges: OT General Charges $OT Visit: 1 Procedure OT Treatments $Therapeutic Activity: 23-37 mins  Binnie Kand M.S., OTR/L Pager: [phone]  01/28/2016, 4:06 PM

## 2016-01-29 ENCOUNTER — Inpatient Hospital Stay (HOSPITAL_COMMUNITY): Payer: BLUE CROSS/BLUE SHIELD

## 2016-01-29 ENCOUNTER — Encounter (HOSPITAL_COMMUNITY)
Admission: EM | Disposition: A | Discharge status: Home / Self Care | Payer: Self-pay | Source: Home / Self Care | Attending: Internal Medicine

## 2016-01-29 ENCOUNTER — Encounter (HOSPITAL_COMMUNITY): Payer: Self-pay

## 2016-01-29 ENCOUNTER — Ambulatory Visit (HOSPITAL_COMMUNITY): Admit: 2016-01-29 | Payer: BLUE CROSS/BLUE SHIELD | Admitting: Cardiovascular Disease

## 2016-01-29 ENCOUNTER — Ambulatory Visit (HOSPITAL_COMMUNITY): Admit: 2016-01-29 | Payer: BLUE CROSS/BLUE SHIELD | Admitting: Internal Medicine

## 2016-01-29 DIAGNOSIS — I639 Cerebral infarction, unspecified: Secondary | ICD-10-CM

## 2016-01-29 DIAGNOSIS — R739 Hyperglycemia, unspecified: Secondary | ICD-10-CM

## 2016-01-29 DIAGNOSIS — I361 Nonrheumatic tricuspid (valve) insufficiency: Secondary | ICD-10-CM

## 2016-01-29 HISTORY — PX: EP IMPLANTABLE DEVICE: SHX172B

## 2016-01-29 HISTORY — PX: TEE WITHOUT CARDIOVERSION: SHX5443

## 2016-01-29 SURGERY — LOOP RECORDER INSERTION

## 2016-01-29 SURGERY — ECHOCARDIOGRAM, TRANSESOPHAGEAL
Anesthesia: Moderate Sedation

## 2016-01-29 MED ORDER — SODIUM CHLORIDE 0.9 % IV SOLN
INTRAVENOUS | Status: DC
  Administered 2016-01-29: 500 mL via INTRAVENOUS

## 2016-01-29 MED ORDER — BUTAMBEN-TETRACAINE-BENZOCAINE 2-2-14 % EX AERO
INHALATION_SPRAY | CUTANEOUS | Status: DC | PRN
  Administered 2016-01-29: 2 via TOPICAL

## 2016-01-29 MED ORDER — ASPIRIN 325 MG PO TABS: 325.0000 mg | ORAL_TABLET | Freq: Every day | ORAL | Status: DC

## 2016-01-29 MED ORDER — ATORVASTATIN CALCIUM 40 MG PO TABS: 40.0000 mg | ORAL_TABLET | Freq: Every day | ORAL | 1 refills | Status: DC

## 2016-01-29 MED ORDER — FENTANYL CITRATE (PF) 100 MCG/2ML IJ SOLN
INTRAMUSCULAR | Status: AC
  Filled 2016-01-29: qty 2

## 2016-01-29 MED ORDER — FENTANYL CITRATE (PF) 100 MCG/2ML IJ SOLN
INTRAMUSCULAR | Status: DC | PRN
  Administered 2016-01-29 (×2): 25 ug via INTRAVENOUS
  Administered 2016-01-29: 50 ug via INTRAVENOUS

## 2016-01-29 MED ORDER — LIDOCAINE-EPINEPHRINE 1 %-1:100000 IJ SOLN
INTRAMUSCULAR | Status: AC
  Filled 2016-01-29: qty 1

## 2016-01-29 MED ORDER — MIDAZOLAM HCL 5 MG/ML IJ SOLN
INTRAMUSCULAR | Status: AC
  Filled 2016-01-29: qty 2

## 2016-01-29 MED ORDER — LIDOCAINE-EPINEPHRINE 1 %-1:100000 IJ SOLN
INTRAMUSCULAR | Status: DC | PRN
  Administered 2016-01-29: 10 mL

## 2016-01-29 MED ORDER — MIDAZOLAM HCL 10 MG/2ML IJ SOLN
INTRAMUSCULAR | Status: DC | PRN
  Administered 2016-01-29 (×3): 2 mg via INTRAVENOUS

## 2016-01-29 SURGICAL SUPPLY — 2 items
LOOP REVEAL LINQSYS (Prosthesis & Implant Heart) ×2 IMPLANT
PACK LOOP INSERTION (CUSTOM PROCEDURE TRAY) ×2 IMPLANT
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
## 2016-01-29 NOTE — Progress Notes (Signed)
*  PRELIMINARY RESULTS* Echocardiogram Echocardiogram Transesophageal has been performed.  Leavy Cella 01/29/2016, 11:47 AM

## 2016-01-29 NOTE — CV Procedure (Signed)
Brief TEE Note  LVEF >55% No LA or LAA thrombus or mass Thickening of the tricuspid and mitral valves Mild TR Lipomatous intra-atrial septum No ASD or PFO by saline microcavitation study  During this procedure the patient is administered a total of Versed 6 mg and Fentanyl 100 mcg to achieve and maintain moderate conscious sedation.  The patient's heart rate, blood pressure, and oxygen saturation are monitored continuously during the procedure. The period of conscious sedation is 20 minutes, of which I was present face-to-face 100% of this time.   For additional details see full report.  Kimberlly Norgard C. Oval Linsey, MD, [facility]  01/29/2016  10:54 AM

## 2016-01-29 NOTE — Discharge Summary (Addendum)
Physician Discharge Summary  Andrea [name] [id number] DOB: 08/01/1950 DOA: 01/26/2016  PCP: Eulas Post, MD  Admit date: 01/26/2016 Discharge date: 01/29/2016  Admitted From: Home Disposition:  Home  Recommendations for Outpatient Follow-up:  1. Follow up with PCP in 1-2 weeks 2. Follow up with [facility] in 6 weeks 3. Do not drive 4. Please take aspirin and new cholesterol medication as prescribed   Home Health: Home OT Equipment/Devices: None  Discharge Condition: Stable CODE STATUS: Full code Diet recommendation: Heart Healthy  Brief/Interim Summary: Andrea [name] a 65 y.o.femalewith medical history significant of HLD, migraines presenting with concern for CVA. Patient started with a migraine about 10 days ago. She saw Dr. Elease Hashimoto on 10/11 and hestarted her on prednisone for 5 days for a persistent headache with a nonfocal exam. It did help for 1-2 days but she would still have headaches at night. Occipital and frontal headaches. Difficulty focusing, ataxia, confusion developed as the week went on. She knew something was wrong, concerned she had a tumor, was very anxious. Unable to get in with neurology and so tried to get back in with Dr. Elease Hashimoto; hearranged for her to be seen by Dr. Catalina [name] today. Neurology said she could have had a stroke and the best thing was to come to the [facility] ER.  Patient underwent stroke workup and was fund to have a CVA (right PCA infarct). TEE showed no PFO and no source of embolus. Loop recorder inserted.  Patient instructed to take 325mg  aspirin daily, change pravachol to lipitor and follow up with [facility] in 6 weeks.  Discharge Diagnoses:  Principal Problem:   Cerebrovascular accident (CVA) due to thrombosis of right posterior cerebral artery ([place]) Active Problems:   Hyperlipidemia   Hormone replacement therapy (HRT)   Hypokalemia   Hyperglycemia   AKI (acute kidney injury) [facility])    Discharge Instructions  Discharge Instructions     Ambulatory referral to Neurology    Complete by:  As directed    Stroke patient. Dr. Leonie Man prefers follow up in 6 weeks   Ambulatory referral to Occupational Therapy    Complete by:  As directed    For vision   Call MD for:  difficulty breathing, headache or visual disturbances    Complete by:  As directed    Call MD for:  extreme fatigue    Complete by:  As directed    Call MD for:  persistant dizziness or light-headedness    Complete by:  As directed    Call MD for:  persistant nausea and vomiting    Complete by:  As directed    Call MD for:  severe uncontrolled pain    Complete by:  As directed    Call MD for:  temperature >100.4    Complete by:  As directed    Increase activity slowly    Complete by:  As directed        Medication List    STOP taking these medications   pravastatin 80 MG tablet Commonly known as:  PRAVACHOL     TAKE these medications   aspirin 325 MG tablet Take 1 tablet (325 mg total) by mouth daily. Start taking on:  01/30/2016   atorvastatin 40 MG tablet Commonly known as:  LIPITOR Take 1 tablet (40 mg total) by mouth daily at 6 PM.   butalbital-acetaminophen-caffeine 50-325-40-30 MG capsule Commonly known as:  FIORICET WITH CODEINE Take 1 capsule by mouth every 4 (four) hours as needed for headache.   CALTRATE  600 PLUS-VIT D PO Take 3 tablets by mouth daily.   cyclobenzaprine 10 MG tablet Commonly known as:  FLEXERIL Take 1 tablet (10 mg total) by mouth at bedtime.   gabapentin 600 MG tablet Commonly known as:  NEURONTIN One tablet in the morning One tablet in the afternoon and Two tablets at bedtime   imipramine 10 MG tablet Commonly known as:  TOFRANIL Take 1 tablet (10 mg total) by mouth at bedtime.   pantoprazole 40 MG tablet Commonly known as:  PROTONIX Take 1 tablet (40 mg total) by mouth daily.   rizatriptan 10 MG disintegrating tablet Commonly known as:  MAXALT-MLT Take 10 mg by mouth as needed.   SYRINGE-NEEDLE  (DISP) 3 ML 25G X 1" 3 ML Misc Use as directed.   Vitamin D3 5000 units Tabs Take by mouth.   Zinc 50 MG Tabs Take 1 tablet by mouth daily.      Follow-up Information    SETHI,PRAMOD, MD Follow up in 6 week(s).   Specialties:  Neurology, Radiology Why:  stroke clinic. office will contact you with appt date and time Contact information: [place] [address] [phone]        [place] Office Follow up on 02/12/2016.   Specialty:  Cardiology Why:  at [facility] information: [address] [place] .   Specialty:  Rehabilitation Why:  They will contact you for the first visit Contact information: [place] [id number] [place], [place]. Schedule an appointment as soon as possible for a visit in 2 week(s).   Specialty:  Family Medicine Contact information: [place] [address] [phone]          No Known Allergies  Consultations:  Neurology  PT/OT   Procedures/Studies: Ct Head Wo Contrast  Result Date: 01/26/2016 CLINICAL DATA:  Chronic migraine headaches. Acute onset of dizziness. Initial encounter. EXAM: CT HEAD WITHOUT CONTRAST TECHNIQUE: Contiguous axial images were obtained from the base of the skull through the vertex without intravenous contrast. COMPARISON:  None. FINDINGS: Brain: There appears to be an evolving subacute infarct involving the medial right temporal lobe and portions of the right occipital lobe. This extends mildly to the right side of the posterior commissure. There is no evidence of hemorrhagic transformation. There is no evidence of hydrocephalus, extra-axial collection or mass lesion/mass effect. Prominence of the sulci suggests mild cortical volume loss. Mild periventricular  and subcortical white matter change likely reflects small vessel ischemic microangiopathy. The brainstem and fourth ventricle are within normal limits. The basal ganglia are unremarkable in appearance. The cerebral hemispheres demonstrate grossly normal gray-white differentiation. No mass effect or midline shift is seen. Vascular: No hyperdense vessel or unexpected calcification. Skull: There is no evidence of fracture; visualized osseous structures are unremarkable in appearance. Sinuses/Orbits: The visualized portions of the orbits are within normal limits. The paranasal sinuses and mastoid air cells are well-aerated. Other: No significant soft tissue abnormalities are seen. IMPRESSION: 1. Apparent evolving subacute infarct involving the medial right temporal lobe and portions of the right occipital lobe. This extends mildly to the right side of the posterior commissure. No evidence of hemorrhagic transformation. 2. Underlying mild cortical volume loss and scattered small vessel ischemic microangiopathy. These results were called by telephone at the time of interpretation on  01/26/2016 at 5:52 pm to Dr. Laverta Baltimore, who verbally acknowledged these results. Electronically Signed   By: Garald Balding M.D.   On: 01/26/2016 17:54   Mr Brain Wo Contrast  Result Date: 01/27/2016 CLINICAL DATA:  Initial evaluation for acute headache, ataxia. History of migraines. Concern for right PCA stroke on prior CT. The EXAM: MRI HEAD WITHOUT CONTRAST MRA HEAD WITHOUT CONTRAST TECHNIQUE: Multiplanar, multiecho pulse sequences of the brain and surrounding structures were obtained without intravenous contrast. Angiographic images of the head were obtained using MRA technique without contrast. COMPARISON:  Prior CT from 01/26/2016. FINDINGS: MRI HEAD FINDINGS Brain: Diffuse prominence of the CSF containing spaces is compatible with generalized age-related cerebral atrophy. Patchy T2/FLAIR hyperintensity within the periventricular and  deep white matter both cerebral hemispheres most consistent with chronic microvascular ischemic disease, mild for age. There is patchy abnormal restricted diffusion involving the mesial right temporal lobe, extending posteriorly into the right occipital lobe as well as the right aspect of the splenium. Findings compatible with acute right PCA territory infarct. Diffusion abnormality extends across the breadth of the splenium, however, associated T2/FLAIR signal abnormality appears confined largely to the right aspect of the splenium. No associated hemorrhage or mass effect. Patchy involvement within the right thalamus as well. No other acute infarct. Gray-white matter differentiation otherwise maintained. No evidence for acute or chronic intracranial hemorrhage. No other areas of chronic infarction identified. No mass lesion, midline shift or mass effect. No hydrocephalus. The no extra-axial fluid collection. Major dural sinuses are grossly patent. Pituitary gland normal. Vascular: Major intracranial vascular flow voids are maintained. Skull and upper cervical spine: Craniocervical junction normal. Visualized upper cervical spine demonstrates no acute abnormality. Bone marrow signal intensity within normal limits. No scalp soft tissue abnormality. Sinuses/Orbits: Globes and orbital soft tissues within normal limits. Mild scattered mucosal thickening within the ethmoidal air cells and maxillary sinuses. Paranasal sinuses are otherwise clear. No mastoid effusion. Inner ear structures grossly normal. MRA HEAD FINDINGS ANTERIOR CIRCULATION: Distal cervical segments of the internal carotid arteries are widely patent with antegrade flow. Petrous, cavernous, and supraclinoid segments are widely patent. A1 segments widely patent. Left A1 segment is hypoplastic. Anterior communicating artery normal. Anterior cerebral arteries well opacified. M1 segments patent without stenosis or occlusion. MCA bifurcations normal. Distal  MCA branches well opacified and symmetric. POSTERIOR CIRCULATION: Vertebral arteries patent to the vertebrobasilar junction. Basilar artery somewhat diminutive but widely patent to its distal aspect. Superior cerebral arteries patent bilaterally. Fetal type left PCA supplied via a widely patent left posterior communicating artery. A small right posterior communicating artery present as well. There is abrupt occlusion of the right P2 segment proximally. No aneurysm or vascular malformation. IMPRESSION: MRI HEAD IMPRESSION: 1. Patchy moderate-sized right PCA territory infarct as above. No associated hemorrhage or mass effect. 2. Mild chronic microvascular ischemic disease. MRA HEAD IMPRESSION: 1. Proximal right P2 occlusion. 2. Otherwise widely patent vertebrobasilar system. 3. Patent anterior circulation. No high-grade or correctable stenosis within the intracranial circulation. Electronically Signed   By: Jeannine Boga M.D.   On: 01/27/2016 06:18   Mr Jodene Nam Head/brain [id number] Cm  Result Date: 01/27/2016 CLINICAL DATA:  Initial evaluation for acute headache, ataxia. History of migraines. Concern for right PCA stroke on prior CT. The EXAM: MRI HEAD WITHOUT CONTRAST MRA HEAD WITHOUT CONTRAST TECHNIQUE: Multiplanar, multiecho pulse sequences of the brain and surrounding structures were obtained without intravenous contrast. Angiographic images of the head were obtained using MRA technique without contrast. COMPARISON:  Prior CT from  01/26/2016. FINDINGS: MRI HEAD FINDINGS Brain: Diffuse prominence of the CSF containing spaces is compatible with generalized age-related cerebral atrophy. Patchy T2/FLAIR hyperintensity within the periventricular and deep white matter both cerebral hemispheres most consistent with chronic microvascular ischemic disease, mild for age. There is patchy abnormal restricted diffusion involving the mesial right temporal lobe, extending posteriorly into the right occipital lobe as well as the  right aspect of the splenium. Findings compatible with acute right PCA territory infarct. Diffusion abnormality extends across the breadth of the splenium, however, associated T2/FLAIR signal abnormality appears confined largely to the right aspect of the splenium. No associated hemorrhage or mass effect. Patchy involvement within the right thalamus as well. No other acute infarct. Gray-white matter differentiation otherwise maintained. No evidence for acute or chronic intracranial hemorrhage. No other areas of chronic infarction identified. No mass lesion, midline shift or mass effect. No hydrocephalus. The no extra-axial fluid collection. Major dural sinuses are grossly patent. Pituitary gland normal. Vascular: Major intracranial vascular flow voids are maintained. Skull and upper cervical spine: Craniocervical junction normal. Visualized upper cervical spine demonstrates no acute abnormality. Bone marrow signal intensity within normal limits. No scalp soft tissue abnormality. Sinuses/Orbits: Globes and orbital soft tissues within normal limits. Mild scattered mucosal thickening within the ethmoidal air cells and maxillary sinuses. Paranasal sinuses are otherwise clear. No mastoid effusion. Inner ear structures grossly normal. MRA HEAD FINDINGS ANTERIOR CIRCULATION: Distal cervical segments of the internal carotid arteries are widely patent with antegrade flow. Petrous, cavernous, and supraclinoid segments are widely patent. A1 segments widely patent. Left A1 segment is hypoplastic. Anterior communicating artery normal. Anterior cerebral arteries well opacified. M1 segments patent without stenosis or occlusion. MCA bifurcations normal. Distal MCA branches well opacified and symmetric. POSTERIOR CIRCULATION: Vertebral arteries patent to the vertebrobasilar junction. Basilar artery somewhat diminutive but widely patent to its distal aspect. Superior cerebral arteries patent bilaterally. Fetal type left PCA supplied  via a widely patent left posterior communicating artery. A small right posterior communicating artery present as well. There is abrupt occlusion of the right P2 segment proximally. No aneurysm or vascular malformation. IMPRESSION: MRI HEAD IMPRESSION: 1. Patchy moderate-sized right PCA territory infarct as above. No associated hemorrhage or mass effect. 2. Mild chronic microvascular ischemic disease. MRA HEAD IMPRESSION: 1. Proximal right P2 occlusion. 2. Otherwise widely patent vertebrobasilar system. 3. Patent anterior circulation. No high-grade or correctable stenosis within the intracranial circulation. Electronically Signed   By: Jeannine Boga M.D.   On: 01/27/2016 06:18      Subjective: Patient seen between LOOP recorder insertion and TEE.  She voices she is ready to go home.  Wants HH OT. Was instructed not to drive.   Discharge Exam: Vitals:   01/29/16 1110 01/29/16 1129  BP: (!) 154/86 (P) 132/77  Pulse: (!) 109 (P) 97  Resp: (!) 23 (P) 20  Temp:  (P) 97.8 F (36.6 C)   Vitals:   01/29/16 1050 01/29/16 1059 01/29/16 1110 01/29/16 1129  BP: (!) 152/89 (!) 159/75 (!) 154/86 (P) 132/77  Pulse: 96 99 (!) 109 (P) 97  Resp: 18 14 (!) 23 (P) 20  Temp:  97.7 F (36.5 C)  (P) 97.8 F (36.6 C)  TempSrc:  Oral  (P) Oral  SpO2: 100% 100% 92%   Weight:      Height:        General: Pt is alert, awake, not in acute distress Cardiovascular: RRR, S1/S2 +, no rubs, no gallops Respiratory: CTA bilaterally, no wheezing, no rhonchi  Abdominal: Soft, NT, ND, bowel sounds + Extremities: no edema, no cyanosis    The results of significant diagnostics from this hospitalization (including imaging, microbiology, ancillary and laboratory) are listed below for reference.     Microbiology: No results found for this or any previous visit (from the past 240 hour(s)).   Labs: BNP (last 3 results) No results for input(s): BNP in the last 8760 hours. Basic Metabolic Panel:  Recent  Labs Lab 01/26/16 1713 01/26/16 1728 01/28/16 0418  NA 141 142 140  K 3.1* 3.2* 4.2  CL 107 105 104  CO2 25  --  26  GLUCOSE 119* 113* 90  BUN 16 19 11   CREATININE 1.14* 1.10* 1.04*  CALCIUM 9.4  --  9.2   Liver Function Tests:  Recent Labs Lab 01/26/16 1713  AST 23  ALT 24  ALKPHOS 78  BILITOT 0.4  PROT 6.4*  ALBUMIN 3.7   No results for input(s): LIPASE, AMYLASE in the last 168 hours. No results for input(s): AMMONIA in the last 168 hours. CBC:  Recent Labs Lab 01/26/16 1713 01/26/16 1728  WBC 13.7*  --   NEUTROABS 7.3  --   HGB 14.4 14.3  HCT 42.6 42.0  MCV 86.9  --   PLT 285  --    Cardiac Enzymes: No results for input(s): CKTOTAL, CKMB, CKMBINDEX, TROPONINI in the last 168 hours. BNP: Invalid input(s): POCBNP CBG:  Recent Labs Lab 01/27/16 1133 01/27/16 1621 01/27/16 2137 01/28/16 0646 01/28/16 1140  GLUCAP 135* 98 104* 79 110*   D-Dimer No results for input(s): DDIMER in the last 72 hours. Hgb A1c  Recent Labs  01/27/16 0347  HGBA1C 5.4   Lipid Profile  Recent Labs  01/27/16 0347  CHOL 204*  HDL 45  LDLCALC 96  TRIG 313*  CHOLHDL 4.5   Thyroid function studies  Recent Labs  01/27/16 0347  TSH 3.300   Anemia work up No results for input(s): VITAMINB12, FOLATE, FERRITIN, TIBC, IRON, RETICCTPCT in the last 72 hours. Urinalysis    Component Value Date/Time   BILIRUBINUR neg 04/09/2014 1146   PROTEINUR neg 04/09/2014 1146   UROBILINOGEN 0.2 04/09/2014 1146   NITRITE neg 04/09/2014 1146   LEUKOCYTESUR small (1+) 04/09/2014 1146   Sepsis Labs Invalid input(s): PROCALCITONIN,  WBC,  LACTICIDVEN Microbiology No results found for this or any previous visit (from the past 240 hour(s)).   Time coordinating discharge: Less than 30 minutes  SIGNED:   Newman Pies, MD  [facility] 01/29/2016, 1:50 PM Pager [phone] If 7PM-7AM, please contact night-coverage [email or web address] Password [email or web address]
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
## 2016-01-29 NOTE — Care Management Note (Signed)
Case Management Note  Patient Details  Name: Lakiesha Lumb MRN: [id number] Date of Birth: 10/08/1950  Subjective/Objective:                    Action/Plan: Patient discharging home with her husband. OT recommending outpatient therapy for vision. CM met with the patient and she is interested in outpatient therapy at [facility]. Orders placed in EPIC and information on the AVS.   Expected Discharge Date:   (Pending)               Expected Discharge Plan:  Home/Self Care  In-House Referral:     Discharge planning Services  CM Consult  Post Acute Care Choice:    Choice offered to:     DME Arranged:    DME Agency:     HH Arranged:    HH Agency:     Status of Service:  Completed, signed off  If discussed at Long Length of Stay Meetings, dates discussed:    Additional Comments:  Kelli F Willard, RN 01/29/2016, 2:00 PM  

## 2016-01-29 NOTE — Interval H&P Note (Signed)
History and Physical Interval Note:  01/29/2016 10:26 AM  Andrea [name]  has presented today for surgery, with the diagnosis of stroke  The various methods of treatment have been discussed with the patient and family. After consideration of risks, benefits and other options for treatment, the patient has consented to  Procedure(s): TRANSESOPHAGEAL ECHOCARDIOGRAM (TEE) will also have a loop (N/A) as a surgical intervention .  The patient's history has been reviewed, patient examined, no change in status, stable for surgery.  I have reviewed the patient's chart and labs.  Questions were answered to the patient's satisfaction.     Hayley Horn C. Oval Linsey, MD, [facility]  01/29/2016  10:27 AM

## 2016-01-29 NOTE — Progress Notes (Signed)
STROKE TEAM PROGRESS NOTE   SUBJECTIVE (INTERVAL HISTORY) Patient up in chair, just back from TEE, which was unrevealing. Awaiting loop recorder placement. Plan discharge later this afternoon.    OBJECTIVE Temp:  [97.7 F (36.5 C)-98.2 F (36.8 C)] (P) 97.8 F (36.6 C) (10/19 1129) Pulse Rate:  [79-109] (P) 97 (10/19 1129) Cardiac Rhythm: Normal sinus rhythm (10/19 1059) Resp:  [11-23] (P) 20 (10/19 1129) BP: (128-184)/(63-104) (P) 132/77 (10/19 1129) SpO2:  [85 %-100 %] 92 % (10/19 1110)  CBC:   Recent Labs Lab 01/26/16 1713 01/26/16 1728  WBC 13.7*  --   NEUTROABS 7.3  --   HGB 14.4 14.3  HCT 42.6 42.0  MCV 86.9  --   PLT 285  --     Basic Metabolic Panel:   Recent Labs Lab 01/26/16 1713 01/26/16 1728 01/28/16 0418  NA 141 142 140  K 3.1* 3.2* 4.2  CL 107 105 104  CO2 25  --  26  GLUCOSE 119* 113* 90  BUN 16 19 11   CREATININE 1.14* 1.10* 1.04*  CALCIUM 9.4  --  9.2    Lipid Panel:     Component Value Date/Time   CHOL 204 (H) 01/27/2016 0347   TRIG 313 (H) 01/27/2016 0347   HDL 45 01/27/2016 0347   CHOLHDL 4.5 01/27/2016 0347   VLDL 63 (H) 01/27/2016 0347   LDLCALC 96 01/27/2016 0347   HgbA1c:  Lab Results  Component Value Date   HGBA1C 5.4 01/27/2016   Urine Drug Screen:     Component Value Date/Time   LABOPIA NONE DETECTED 01/26/2016 0027   COCAINSCRNUR NONE DETECTED 01/26/2016 0027   LABBENZ NONE DETECTED 01/26/2016 0027   AMPHETMU NONE DETECTED 01/26/2016 0027   THCU NONE DETECTED 01/26/2016 0027   LABBARB NONE DETECTED 01/26/2016 0027      IMAGING  No results found.  TEE LVEF >55% No LA or LAA thrombus or mass Thickening of the tricuspid and mitral valves Mild TR Lipomatous intra-atrial septum No ASD or PFO by saline microcavitation study   PHYSICAL EXAM Pleasant middle aged Caucasian lady not in distress.  . Afebrile. Head is nontraumatic. Neck is supple without bruit.    Cardiac exam no murmur or gallop. Lungs are clear  to auscultation. Distal pulses are well felt. Neurological Exam ;  Awake  Alert oriented x 3. Normal speech and language.. Diminished recall 1/3. Partial left homonymous hemianopsia. eye movements full without nystagmus.fundi were not visualized. Vision acuity and fields appear normal. Hearing is normal. Palatal movements are normal. Face symmetric. Tongue midline. Normal strength, tone, reflexes and coordination. Normal sensation. Gait deferred.  ASSESSMENT/PLAN [name] is a 65 y.o. female with history of  migraine headaches, hyperlipidemia, and degenerative disc disease presenting with headache and visual changes. She did not receive IV t-PA due to delay in arrival.   Stroke:  Patchy right PCA infarct , embolicsecondary to unknown source  Resultant  dizziness, unsteady gait, left field cut  MRI  patchy right PCA infarct  MRA  proximal P2 occlusion  Carotid Doppler  no significant stenosis  2D Echo  EF 60-65%, no source of embolus  TEE negative. No PFO  [facility] electrophysiologist to place an implantable loop recorder to evaluate for atrial fibrillation as etiology of stroke today.  LDL 96  HgbA1c 5.4  Lovenox 40 mg sq daily for VTE prophylaxis    No antithrombotic prior to admission, now on aspirin 325 mg daily  Patient counseled  to be compliant with her antithrombotic medications  Ongoing aggressive stroke risk factor management  Therapy recommendations:  No PT, OP OT  Disposition:  Return home  No driving at this time  [facility] for discharge once workup completed  Follow up with Dr. Leonie Man in 6 weeks, order placed.  Consider RESPECT ESUS research study. [facility] associates will follow up with her.   Elevated blood pressure  Stable Long-term BP goal normotensive  Hyperlipidemia  Home meds:  pravachol 80 mg daily  Changed to Lipitor 40 mg daily   LDL 96, goal < 70  Continue statin at  discharge  Hyperglycemia  HgbA1c 5.4, goal < 7.0  Other Stroke Risk Factors  Obesity, Body mass index is 28.11 kg/m., recommend weight loss, diet and exercise as appropriate   Migraines - hx of episodic migraines. The last episode started 10 days ago. Different than usual to have so long. New symptoms (different than from prior headaches) of disorientation with imbalance. Reports banging into the wall. Feels she is having difficulty focusing to read on 10/9. No other vision changes noted  Other Active Problems  AKI  Menopause, on HRT prempro. Currently on hold. Globally, neurology recommends to stop. GYN had planned to stop at age 34. Recommend discussion with GYN and cessation.   Hospital day # [place] for Pager information 01/29/2016 11:51 AM  I have personally examined this patient, reviewed notes, independently viewed imaging studies, participated in medical decision making and plan of care.ROS completed by me personally and pertinent positives fully documented  I have made any additions or clarifications directly to the above note. Agree with note above. Consider participation in Braden trial if interested.  Antony Contras, MD Medical Director [facility] Pager: [phone] 01/29/2016 2:56 PM  To contact Stroke Continuity provider, please refer to [email or web address] After hours, contact General Neurology

## 2016-01-29 NOTE — Discharge Instructions (Signed)

## 2016-01-29 NOTE — Progress Notes (Signed)
Pt being discharged per orders from MD. Pt educated on discharge instructions. Pt verbalized understanding of instructions. All questions and concerns were addressed. Pt's IV was removed. Pt exited hospital via wheelchair.

## 2016-01-30 ENCOUNTER — Encounter (HOSPITAL_COMMUNITY): Payer: Self-pay | Admitting: Cardiovascular Disease

## 2016-01-30 NOTE — Telephone Encounter (Addendum)
Pt does not qualify for TCM as she has [employer].   KATHY - please schedule pt for hosp f/u appt with [facility]. Thanks!

## 2016-02-04 NOTE — Telephone Encounter (Signed)
Left message to call back and schedule appt

## 2016-02-09 NOTE — Telephone Encounter (Signed)
Pt has been scheduled.  °

## 2016-02-11 ENCOUNTER — Telehealth: Payer: Self-pay | Admitting: Cardiology

## 2016-02-11 NOTE — Telephone Encounter (Signed)
Pt had loop recorder implanted on 01-29-16. She is scheduled for wound check on 02-12-16. She wanted to know if she could keep her mammogram appt for tomorrow 02-12-2016 as well. After consulting w/ device tech RN I explained to pt that as long as the wound looks fine it will be ok but the question will need to be reevaluated tomorrow. Pt verbalized understanding.

## 2016-02-12 ENCOUNTER — Encounter: Payer: Self-pay | Admitting: Internal Medicine

## 2016-02-12 ENCOUNTER — Ambulatory Visit (INDEPENDENT_AMBULATORY_CARE_PROVIDER_SITE_OTHER): Payer: BLUE CROSS/BLUE SHIELD | Admitting: *Deleted

## 2016-02-12 DIAGNOSIS — I639 Cerebral infarction, unspecified: Secondary | ICD-10-CM

## 2016-02-12 NOTE — Progress Notes (Signed)
Wound Loop check in clinic. Wound well healed edges approximated, no redness or edema. Battery status: Good. R-waves 0.59 mV. 0 symptom episodes, 0 tachy episodes, Pause and Brady programmed off prior to clinic check. 0 AF episodes. Monthly summary reports and ROV with JA PRN. Pt education completed.

## 2016-02-13 ENCOUNTER — Encounter: Payer: Self-pay | Admitting: Family Medicine

## 2016-02-13 ENCOUNTER — Ambulatory Visit (INDEPENDENT_AMBULATORY_CARE_PROVIDER_SITE_OTHER): Payer: BLUE CROSS/BLUE SHIELD | Admitting: Family Medicine

## 2016-02-13 VITALS — BP 146/80 | HR 112 | Temp 98.3°F | Ht 66.0 in | Wt 172.0 lb

## 2016-02-13 DIAGNOSIS — E785 Hyperlipidemia, unspecified: Secondary | ICD-10-CM | POA: Diagnosis not present

## 2016-02-13 DIAGNOSIS — I63331 Cerebral infarction due to thrombosis of right posterior cerebral artery: Secondary | ICD-10-CM | POA: Diagnosis not present

## 2016-02-13 DIAGNOSIS — I10 Essential (primary) hypertension: Secondary | ICD-10-CM

## 2016-02-13 MED ORDER — LISINOPRIL 5 MG PO TABS: 5.0000 mg | ORAL_TABLET | Freq: Every day | ORAL | 3 refills | Status: DC

## 2016-02-13 MED ORDER — PANTOPRAZOLE SODIUM 40 MG PO TBEC: 40.0000 mg | DELAYED_RELEASE_TABLET | Freq: Every day | ORAL | 2 refills | Status: DC

## 2016-02-13 MED ORDER — PRAVASTATIN SODIUM 40 MG PO TABS: 40.0000 mg | ORAL_TABLET | Freq: Every day | ORAL | 3 refills | Status: DC

## 2016-02-13 NOTE — Progress Notes (Signed)
Pre visit review using our clinic review tool, if applicable. No additional management support is needed unless otherwise documented below in the visit note. 

## 2016-02-13 NOTE — Telephone Encounter (Signed)
Pt called to follow up on refill request.  Pt states she just picked up her rx from [facility] and it was only 40 mg.  Pt states she has been taking  pravastatin (PRAVACHOL) 80 MG tablet   Pt would iek to know if Dr Elease Hashimoto wants her to take 2 of these and then send in a new rx for the 80 mg because she already picked up her 40 mg
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
## 2016-02-14 DIAGNOSIS — I10 Essential (primary) hypertension: Secondary | ICD-10-CM | POA: Insufficient documentation

## 2016-02-16 ENCOUNTER — Encounter: Payer: Self-pay | Admitting: Family Medicine

## 2016-02-16 ENCOUNTER — Other Ambulatory Visit: Payer: Self-pay

## 2016-02-16 MED ORDER — ROSUVASTATIN CALCIUM 40 MG PO TABS: 40.0000 mg | ORAL_TABLET | Freq: Every day | ORAL | 5 refills | Status: DC

## 2016-02-17 ENCOUNTER — Other Ambulatory Visit: Payer: Self-pay

## 2016-02-17 ENCOUNTER — Ambulatory Visit: Payer: BLUE CROSS/BLUE SHIELD | Admitting: Occupational Therapy

## 2016-02-17 MED ORDER — ROSUVASTATIN CALCIUM 40 MG PO TABS: 40.0000 mg | ORAL_TABLET | Freq: Every day | ORAL | 5 refills | Status: DC

## 2016-02-19 ENCOUNTER — Ambulatory Visit: Payer: BLUE CROSS/BLUE SHIELD | Attending: Family Medicine | Admitting: Occupational Therapy

## 2016-02-19 DIAGNOSIS — R41842 Visuospatial deficit: Secondary | ICD-10-CM | POA: Insufficient documentation
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
## 2016-02-24 ENCOUNTER — Ambulatory Visit: Payer: BLUE CROSS/BLUE SHIELD | Admitting: Occupational Therapy

## 2016-02-24 DIAGNOSIS — R41842 Visuospatial deficit: Secondary | ICD-10-CM
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
## 2016-03-01 ENCOUNTER — Ambulatory Visit (INDEPENDENT_AMBULATORY_CARE_PROVIDER_SITE_OTHER): Payer: BLUE CROSS/BLUE SHIELD | Admitting: *Deleted

## 2016-03-01 DIAGNOSIS — I639 Cerebral infarction, unspecified: Secondary | ICD-10-CM

## 2016-03-01 LAB — HM PAP SMEAR: HM Pap smear: NORMAL

## 2016-03-01 LAB — HM MAMMOGRAPHY: HM Mammogram: NORMAL (ref 0–4)

## 2016-03-01 NOTE — Progress Notes (Signed)
[facility] Summary Report / Loop Recorder 

## 2016-03-02 ENCOUNTER — Telehealth: Payer: Self-pay | Admitting: Cardiology

## 2016-03-02 NOTE — Telephone Encounter (Signed)
Pt called and stated that she is going on vacation for 10 days. She wanted to know if she needed to take her monitor. I informed pt that our recommendations is that she doesn't need to take her monitor but when he gets back she will need to send a manual transmission. Informed pt how to send a manual transmission. Pt verbalized understanding.

## 2016-03-11 ENCOUNTER — Encounter: Payer: Self-pay | Admitting: Family Medicine

## 2016-03-11 ENCOUNTER — Ambulatory Visit: Payer: BLUE CROSS/BLUE SHIELD | Admitting: Occupational Therapy

## 2016-03-11 DIAGNOSIS — R41842 Visuospatial deficit: Secondary | ICD-10-CM | POA: Diagnosis not present

## 2016-03-11 NOTE — Patient Instructions (Signed)
   RECOMMENDATIONS FOR RETURNING TO DRIVING:     First, get MD clearance  Drive slow, begin in low traffic areas with someone else.  No distractions (no cell phone, music, or eating), no night time driving, no interstate driving  Watch out for pedestrians, cyclists, side streets
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
## 2016-03-12 ENCOUNTER — Ambulatory Visit: Payer: BLUE CROSS/BLUE SHIELD | Admitting: Family Medicine

## 2016-03-12 ENCOUNTER — Other Ambulatory Visit: Payer: Self-pay

## 2016-03-12 MED ORDER — PANTOPRAZOLE SODIUM 40 MG PO TBEC: 40.0000 mg | DELAYED_RELEASE_TABLET | Freq: Every day | ORAL | 2 refills | Status: DC

## 2016-03-12 MED ORDER — CYCLOBENZAPRINE HCL 10 MG PO TABS: 10.0000 mg | ORAL_TABLET | Freq: Every day | ORAL | 2 refills | Status: DC

## 2016-03-15 ENCOUNTER — Encounter: Payer: Self-pay | Admitting: Family Medicine

## 2016-03-15 ENCOUNTER — Ambulatory Visit (INDEPENDENT_AMBULATORY_CARE_PROVIDER_SITE_OTHER): Payer: BLUE CROSS/BLUE SHIELD | Admitting: Family Medicine

## 2016-03-15 VITALS — BP 120/70 | HR 104 | Temp 98.1°F | Ht 66.0 in | Wt 176.6 lb

## 2016-03-15 DIAGNOSIS — E785 Hyperlipidemia, unspecified: Secondary | ICD-10-CM | POA: Diagnosis not present

## 2016-03-15 DIAGNOSIS — I63331 Cerebral infarction due to thrombosis of right posterior cerebral artery: Secondary | ICD-10-CM

## 2016-03-15 DIAGNOSIS — I10 Essential (primary) hypertension: Secondary | ICD-10-CM | POA: Diagnosis not present

## 2016-03-15 LAB — BASIC METABOLIC PANEL
BUN: 17 mg/dL (ref 6–23)
CALCIUM: 9.9 mg/dL (ref 8.4–10.5)
CO2: 32 meq/L (ref 19–32)
CREATININE: 1.11 mg/dL (ref 0.40–1.20)
Chloride: 103 mEq/L (ref 96–112)
GFR: 52.5 mL/min — ABNORMAL LOW (ref >60.00)
GLUCOSE: 119 mg/dL — AB (ref 70–99)
Potassium: 4.1 mEq/L (ref 3.5–5.1)
Sodium: 142 mEq/L (ref 135–145)

## 2016-03-15 LAB — LIPID PANEL
Cholesterol: 136 mg/dL (ref 0–200)
HDL: 42.2 mg/dL (ref >39.00)
LDL Cholesterol: 55 mg/dL (ref 0–99)
NONHDL: 93.91
TRIGLYCERIDES: 193 mg/dL — AB (ref 0.0–149.0)
Total CHOL/HDL Ratio: 3
VLDL: 38.6 mg/dL (ref 0.0–40.0)

## 2016-03-15 LAB — HEPATIC FUNCTION PANEL
ALBUMIN: 4.2 g/dL (ref 3.5–5.2)
ALK PHOS: 90 U/L (ref 39–117)
ALT: 52 U/L — ABNORMAL HIGH (ref 0–35)
AST: 27 U/L (ref 0–37)
Bilirubin, Direct: 0.1 mg/dL (ref 0.0–0.3)
TOTAL PROTEIN: 6.6 g/dL (ref 6.0–8.3)
Total Bilirubin: 0.3 mg/dL (ref 0.2–1.2)
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
## 2016-03-15 NOTE — Progress Notes (Signed)
Pre visit review using our clinic review tool, if applicable. No additional management support is needed unless otherwise documented below in the visit note. 

## 2016-03-16 ENCOUNTER — Encounter: Payer: Self-pay | Admitting: Neurology

## 2016-03-16 ENCOUNTER — Ambulatory Visit: Payer: BLUE CROSS/BLUE SHIELD | Attending: Family Medicine | Admitting: Occupational Therapy

## 2016-03-16 ENCOUNTER — Ambulatory Visit (INDEPENDENT_AMBULATORY_CARE_PROVIDER_SITE_OTHER): Payer: BLUE CROSS/BLUE SHIELD | Admitting: Neurology

## 2016-03-16 VITALS — BP 110/68 | HR 100

## 2016-03-16 DIAGNOSIS — I639 Cerebral infarction, unspecified: Secondary | ICD-10-CM

## 2016-03-16 DIAGNOSIS — R41842 Visuospatial deficit: Secondary | ICD-10-CM | POA: Diagnosis present
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
## 2016-03-16 NOTE — Patient Instructions (Addendum)
Stroke Prevention Some medical conditions and behaviors are associated with an increased chance of having a stroke. You may prevent a stroke by making healthy choices and managing medical conditions. How can I reduce my risk of having a stroke?  Stay physically active. Get at least 30 minutes of activity on most or all days.  Do not smoke. It may also be helpful to avoid exposure to secondhand smoke.  Limit alcohol use. Moderate alcohol use is considered to be:  No more than 2 drinks per day for men.  No more than 1 drink per day for nonpregnant women.  Eat healthy foods. This involves:  Eating 5 or more servings of fruits and vegetables a day.  Making dietary changes that address high blood pressure (hypertension), high cholesterol, diabetes, or obesity.  Manage your cholesterol levels.  Making food choices that are high in fiber and low in saturated fat, trans fat, and cholesterol may control cholesterol levels.  Take any prescribed medicines to control cholesterol as directed by your health care provider.  Manage your diabetes.  Controlling your carbohydrate and sugar intake is recommended to manage diabetes.  Take any prescribed medicines to control diabetes as directed by your health care provider.  Control your hypertension.  Making food choices that are low in salt (sodium), saturated fat, trans fat, and cholesterol is recommended to manage hypertension.  Ask your health care provider if you need treatment to lower your blood pressure. Take any prescribed medicines to control hypertension as directed by your health care provider.  If you are 18-39 years of age, have your blood pressure checked every 3-5 years. If you are 40 years of age or older, have your blood pressure checked every year.  Maintain a healthy weight.  Reducing calorie intake and making food choices that are low in sodium, saturated fat, trans fat, and cholesterol are recommended to manage  weight.  Stop drug abuse.  Avoid taking birth control pills.  Talk to your health care provider about the risks of taking birth control pills if you are over 35 years old, smoke, get migraines, or have ever had a blood clot.  Get evaluated for sleep disorders (sleep apnea).  Talk to your health care provider about getting a sleep evaluation if you snore a lot or have excessive sleepiness.  Take medicines only as directed by your health care provider.  For some people, aspirin or blood thinners (anticoagulants) are helpful in reducing the risk of forming abnormal blood clots that can lead to stroke. If you have the irregular heart rhythm of atrial fibrillation, you should be on a blood thinner unless there is a good reason you cannot take them.  Understand all your medicine instructions.  Make sure that other conditions (such as anemia or atherosclerosis) are addressed. Get help right away if:  You have sudden weakness or numbness of the face, arm, or leg, especially on one side of the body.  Your face or eyelid droops to one side.  You have sudden confusion.  You have trouble speaking (aphasia) or understanding.  You have sudden trouble seeing in one or both eyes.  You have sudden trouble walking.  You have dizziness.  You have a loss of balance or coordination.  You have a sudden, severe headache with no known cause.  You have new chest pain or an irregular heartbeat. Any of these symptoms may represent a serious problem that is an emergency. Do not wait to see if the symptoms will go away.   Get medical help at once. Call your local emergency services (911 in [place] Do not drive yourself to the hospital. This information is not intended to replace advice given to you by your health care provider. Make sure you discuss any questions you have with your health care provider. Document Released: 05/06/2004 Document Revised: 09/04/2015 Document Reviewed: 09/29/2012 Elsevier  Interactive Patient Education  2017 Elsevier Inc.  
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
## 2016-03-17 ENCOUNTER — Telehealth: Payer: Self-pay | Admitting: Neurology

## 2016-03-17 NOTE — Telephone Encounter (Signed)
Patient is calling because she was looking at her OV notes from yesterday on my chart and cannot see in the notes where Dr. Leonie Man said it was alright for her to drive. I tried to help her find it but unable.

## 2016-03-17 NOTE — Telephone Encounter (Signed)
Rn call patient back about the avs not showing driving instructions. Rn stated its apart of the permanent chart and its not displayed on mycnart. PT requested a release form to be mail. Pt will bring in form and request copy of last office note showing driving instructions.

## 2016-03-29 ENCOUNTER — Ambulatory Visit (INDEPENDENT_AMBULATORY_CARE_PROVIDER_SITE_OTHER): Payer: BLUE CROSS/BLUE SHIELD | Admitting: *Deleted

## 2016-03-29 DIAGNOSIS — I639 Cerebral infarction, unspecified: Secondary | ICD-10-CM

## 2016-03-29 NOTE — Progress Notes (Signed)
[facility] Summary Report / Loop Recorder 

## 2016-04-11 LAB — CUP PACEART REMOTE DEVICE CHECK
Date Time Interrogation Session: 20171118161307
MDC IDC PG IMPLANT DT: 20171019

## 2016-04-11 NOTE — Progress Notes (Signed)
Carelink summary report received. Battery status OK. Normal device function. No new symptom episodes, tachy episodes, brady, or pause episodes. No new AF episodes. Monthly summary reports and ROV/PRN 

## 2016-04-28 ENCOUNTER — Ambulatory Visit (INDEPENDENT_AMBULATORY_CARE_PROVIDER_SITE_OTHER): Payer: BLUE CROSS/BLUE SHIELD | Admitting: *Deleted

## 2016-04-28 DIAGNOSIS — I639 Cerebral infarction, unspecified: Secondary | ICD-10-CM | POA: Diagnosis not present

## 2016-04-30 NOTE — Progress Notes (Signed)
[facility] Summary Report / Loop Recorder 

## 2016-05-17 LAB — CUP PACEART REMOTE DEVICE CHECK
Implantable Pulse Generator Implant Date: 20171019
MDC IDC SESS DTM: 20171218171410

## 2016-05-25 ENCOUNTER — Ambulatory Visit (INDEPENDENT_AMBULATORY_CARE_PROVIDER_SITE_OTHER): Payer: BLUE CROSS/BLUE SHIELD | Admitting: Family Medicine

## 2016-05-25 VITALS — BP 140/70 | HR 83 | Ht 66.0 in | Wt 183.0 lb

## 2016-05-25 DIAGNOSIS — M25532 Pain in left wrist: Secondary | ICD-10-CM
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
## 2016-05-25 NOTE — Patient Instructions (Signed)
Alfonse Ras Disease Introduction Alfonse Ras disease is inflammation of the tendon on the thumb side of the wrist. Tendons are cords of tissue that connect bones to muscles. The tendons in your hand pass through a tunnel, or sheath. A slippery layer of tissue (synovium) lets the tendons move smoothly in the sheath. With de Quervain disease, the sheath swells or thickens, causing friction and pain. The condition is also called de Quervain tendinosis and de Quervain syndrome. It occurs most often in women who are 65-13 years old. What are the causes? The exact cause of de Quervain disease is not known. It may result from:  Overusing your hands, especially with repetitive motions that involve twisting your hand or using a forceful grip.  Pregnancy.  Rheumatoid disease. What increases the risk? You may have a greater risk for de Quervain disease if you:  Are a middle-aged woman.  Are pregnant.  Have rheumatoid arthritis.  Have diabetes.  Use your hands far more than normal, especially with a tight grip or excessive twisting. What are the signs or symptoms? Pain on the thumb side of your wrist is the main symptom of de Quervain disease. Other signs and symptoms include:  Pain that gets worse when you grasp something or turn your wrist.  Pain that extends up the forearm.  Cysts in the area of the pain.  Swelling of your wrist and hand.  A sensation of snapping in the wrist.  Trouble moving the thumb and wrist. How is this diagnosed? Your health care provider may diagnose de Quervain disease based on your signs and symptoms. A physical exam will also be done. A simple test Wynn [place] test) that involves pulling your thumb and wrist to see if this causes pain can help determine whether you have the condition. Sometimes you may need to have an X-ray. How is this treated? Avoiding any activity that causes pain and swelling is the best treatment. Other options include:  Wearing a  splint.  Taking medicine. Anti-inflammatory medicines and corticosteroid injections may reduce inflammation and relieve pain.  Having surgery if other treatments do not work. Follow these instructions at home:  Using ice can be helpful after doing activities that involve the sore wrist. To apply ice to the injured area:  Put ice in a plastic bag.  Place a towel between your skin and the bag.  Leave the ice on for 20 minutes, 2-3 times a day.  Take medicines only as directed by your health care provider.  Wear your splint as directed. This will allow your hand to rest and heal. Contact a health care provider if:  Your pain medicine does not help.  Your pain gets worse.  You develop new symptoms. This information is not intended to replace advice given to you by your health care provider. Make sure you discuss any questions you have with your health care provider. Document Released: 12/22/2000 Document Revised: 09/04/2015 Document Reviewed: 08/01/2013  2017 Elsevier  Go for X-ray and we will call results If no concerns, would recommend icing 2-3 times per day and thumb spica splint.

## 2016-05-25 NOTE — Progress Notes (Signed)
Pre visit review using our clinic review tool, if applicable. No additional management support is needed unless otherwise documented below in the visit note. 

## 2016-05-26 ENCOUNTER — Ambulatory Visit (INDEPENDENT_AMBULATORY_CARE_PROVIDER_SITE_OTHER)
Admission: RE | Admit: 2016-05-26 | Discharge: 2016-05-26 | Disposition: A | Discharge status: Home / Self Care | Payer: BLUE CROSS/BLUE SHIELD | Source: Ambulatory Visit | Attending: Family Medicine | Admitting: Family Medicine

## 2016-05-26 DIAGNOSIS — M25532 Pain in left wrist: Secondary | ICD-10-CM | POA: Diagnosis not present

## 2016-05-28 ENCOUNTER — Ambulatory Visit (INDEPENDENT_AMBULATORY_CARE_PROVIDER_SITE_OTHER): Payer: BLUE CROSS/BLUE SHIELD | Admitting: *Deleted

## 2016-05-28 DIAGNOSIS — I639 Cerebral infarction, unspecified: Secondary | ICD-10-CM | POA: Diagnosis not present

## 2016-05-31 NOTE — Progress Notes (Signed)
[facility] Summary Report / Loop Recorder 

## 2016-06-04 ENCOUNTER — Encounter: Payer: Self-pay | Admitting: Family Medicine

## 2016-06-04 ENCOUNTER — Other Ambulatory Visit: Payer: Self-pay

## 2016-06-04 MED ORDER — LISINOPRIL 5 MG PO TABS: 5.0000 mg | ORAL_TABLET | Freq: Every day | ORAL | 2 refills | Status: DC

## 2016-06-04 MED ORDER — CYCLOBENZAPRINE HCL 10 MG PO TABS: 10.0000 mg | ORAL_TABLET | Freq: Every day | ORAL | 2 refills | Status: DC

## 2016-06-04 MED ORDER — ROSUVASTATIN CALCIUM 40 MG PO TABS: 40.0000 mg | ORAL_TABLET | Freq: Every day | ORAL | 2 refills | Status: DC

## 2016-06-05 LAB — CUP PACEART REMOTE DEVICE CHECK
Date Time Interrogation Session: 20180117171047
Implantable Pulse Generator Implant Date: 20171019

## 2016-06-16 LAB — CUP PACEART REMOTE DEVICE CHECK
Date Time Interrogation Session: 20180216171001
Implantable Pulse Generator Implant Date: 20171019

## 2016-06-28 ENCOUNTER — Ambulatory Visit (INDEPENDENT_AMBULATORY_CARE_PROVIDER_SITE_OTHER): Payer: BLUE CROSS/BLUE SHIELD | Admitting: *Deleted

## 2016-06-28 DIAGNOSIS — I639 Cerebral infarction, unspecified: Secondary | ICD-10-CM | POA: Diagnosis not present

## 2016-06-28 NOTE — Progress Notes (Signed)
[facility] Summary Report / Loop Recorder 

## 2016-07-07 LAB — CUP PACEART REMOTE DEVICE CHECK
Date Time Interrogation Session: 20180318173708
Implantable Pulse Generator Implant Date: 20171019

## 2016-07-20 ENCOUNTER — Telehealth: Payer: Self-pay | Admitting: *Deleted

## 2016-07-20 NOTE — Telephone Encounter (Signed)
[facility] ([facility]) refill request gabapentin (NEURONTIN) 600 MG tablet Last refill 12/10/15 #360 with 2 refills. Last office visit 05/25/16. Okay to fill?

## 2016-07-20 NOTE — Telephone Encounter (Signed)
Refill OK

## 2016-07-21 MED ORDER — GABAPENTIN 600 MG PO TABS: ORAL_TABLET | ORAL | 2 refills | Status: DC

## 2016-07-21 NOTE — Telephone Encounter (Signed)
Rx sent 

## 2016-07-25 ENCOUNTER — Encounter: Payer: Self-pay | Admitting: Family Medicine

## 2016-07-27 ENCOUNTER — Ambulatory Visit (INDEPENDENT_AMBULATORY_CARE_PROVIDER_SITE_OTHER): Payer: BLUE CROSS/BLUE SHIELD | Admitting: *Deleted

## 2016-07-27 DIAGNOSIS — I639 Cerebral infarction, unspecified: Secondary | ICD-10-CM | POA: Diagnosis not present

## 2016-07-28 NOTE — Progress Notes (Signed)
[facility] Summary Report / Loop Recorder 

## 2016-08-05 ENCOUNTER — Other Ambulatory Visit: Payer: Self-pay | Admitting: *Deleted

## 2016-08-05 ENCOUNTER — Encounter: Payer: Self-pay | Admitting: Family Medicine

## 2016-08-05 MED ORDER — ROSUVASTATIN CALCIUM 40 MG PO TABS: 40.0000 mg | ORAL_TABLET | Freq: Every day | ORAL | 2 refills | Status: DC

## 2016-08-05 NOTE — Telephone Encounter (Signed)
Rx done and the pt was notified via Mychart message. 

## 2016-08-11 LAB — CUP PACEART REMOTE DEVICE CHECK
Date Time Interrogation Session: 20180417174504
MDC IDC PG IMPLANT DT: 20171019

## 2016-08-17 ENCOUNTER — Encounter: Payer: Self-pay | Admitting: Family Medicine

## 2016-08-18 MED ORDER — CYCLOBENZAPRINE HCL 10 MG PO TABS: 10.0000 mg | ORAL_TABLET | Freq: Every day | ORAL | 0 refills | Status: DC

## 2016-08-18 MED ORDER — LISINOPRIL 5 MG PO TABS: 5.0000 mg | ORAL_TABLET | Freq: Every day | ORAL | 3 refills | Status: DC

## 2016-08-26 ENCOUNTER — Ambulatory Visit (INDEPENDENT_AMBULATORY_CARE_PROVIDER_SITE_OTHER): Payer: Self-pay | Admitting: *Deleted

## 2016-08-26 DIAGNOSIS — I639 Cerebral infarction, unspecified: Secondary | ICD-10-CM

## 2016-08-26 NOTE — Progress Notes (Signed)
[facility] Summary Report / Loop Recorder 

## 2016-09-06 LAB — CUP PACEART REMOTE DEVICE CHECK
Date Time Interrogation Session: 20180517180934
MDC IDC PG IMPLANT DT: 20171019

## 2016-09-15 ENCOUNTER — Encounter: Payer: Self-pay | Admitting: Cardiology

## 2016-09-15 NOTE — Progress Notes (Signed)
Letter  

## 2016-09-23 DIAGNOSIS — G43009 Migraine without aura, not intractable, without status migrainosus: Secondary | ICD-10-CM | POA: Diagnosis not present

## 2016-09-23 DIAGNOSIS — G44219 Episodic tension-type headache, not intractable: Secondary | ICD-10-CM | POA: Diagnosis not present

## 2016-09-27 ENCOUNTER — Ambulatory Visit (INDEPENDENT_AMBULATORY_CARE_PROVIDER_SITE_OTHER): Payer: Medicare Other | Admitting: *Deleted

## 2016-09-27 DIAGNOSIS — I639 Cerebral infarction, unspecified: Secondary | ICD-10-CM | POA: Diagnosis not present

## 2016-09-27 NOTE — Progress Notes (Signed)
[facility] Summary Report / Loop Recorder 

## 2016-09-30 DIAGNOSIS — M654 Radial styloid tenosynovitis [de Quervain]: Secondary | ICD-10-CM | POA: Diagnosis not present

## 2016-10-05 LAB — CUP PACEART REMOTE DEVICE CHECK
Date Time Interrogation Session: 20180616183823
Implantable Pulse Generator Implant Date: 20171019

## 2016-10-06 ENCOUNTER — Encounter: Payer: Self-pay | Admitting: Nurse Practitioner

## 2016-10-06 ENCOUNTER — Ambulatory Visit (INDEPENDENT_AMBULATORY_CARE_PROVIDER_SITE_OTHER): Payer: Medicare Other | Admitting: Nurse Practitioner

## 2016-10-06 VITALS — BP 126/80 | HR 97 | Ht 66.0 in | Wt 179.4 lb

## 2016-10-06 DIAGNOSIS — I639 Cerebral infarction, unspecified: Secondary | ICD-10-CM

## 2016-10-06 DIAGNOSIS — I10 Essential (primary) hypertension: Secondary | ICD-10-CM

## 2016-10-06 DIAGNOSIS — E785 Hyperlipidemia, unspecified: Secondary | ICD-10-CM | POA: Diagnosis not present
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
## 2016-10-06 NOTE — Patient Instructions (Addendum)
Stressed the importance of management of risk factors to prevent further stroke Continue aspirin for secondary stroke prevention Maintain strict control of hypertension with blood pressure goal below 130/90, today's reading 126/80 continue antihypertensive medications Cholesterol with LDL cholesterol less than 70, followed by primary care,  most recent 56 continue statin drug Crestor Exercise by walking,   eat healthy diet with whole grains,  fresh fruits and vegetables Will discharge from stroke clinic Stroke Prevention Some medical conditions and behaviors are associated with an increased chance of having a stroke. You may prevent a stroke by making healthy choices and managing medical conditions. How can I reduce my risk of having a stroke?  Stay physically active. Get at least 30 minutes of activity on most or all days.  Do not smoke. It may also be helpful to avoid exposure to secondhand smoke.  Limit alcohol use. Moderate alcohol use is considered to be: ? No more than 2 drinks per day for men. ? No more than 1 drink per day for nonpregnant women.  Eat healthy foods. This involves: ? Eating 5 or more servings of fruits and vegetables a day. ? Making dietary changes that address high blood pressure (hypertension), high cholesterol, diabetes, or obesity.  Manage your cholesterol levels. ? Making food choices that are high in fiber and low in saturated fat, trans fat, and cholesterol may control cholesterol levels. ? Take any prescribed medicines to control cholesterol as directed by your health care provider.  Manage your diabetes. ? Controlling your carbohydrate and sugar intake is recommended to manage diabetes. ? Take any prescribed medicines to control diabetes as directed by your health care provider.  Control your hypertension. ? Making food choices that are low in salt (sodium), saturated fat, trans fat, and cholesterol is recommended to manage hypertension. ? Ask your  health care provider if you need treatment to lower your blood pressure. Take any prescribed medicines to control hypertension as directed by your health care provider. ? If you are 34-64 years of age, have your blood pressure checked every 3-5 years. If you are 76 years of age or older, have your blood pressure checked every year.  Maintain a healthy weight. ? Reducing calorie intake and making food choices that are low in sodium, saturated fat, trans fat, and cholesterol are recommended to manage weight.  Stop drug abuse.  Avoid taking birth control pills. ? Talk to your health care provider about the risks of taking birth control pills if you are over 32 years old, smoke, get migraines, or have ever had a blood clot.  Get evaluated for sleep disorders (sleep apnea). ? Talk to your health care provider about getting a sleep evaluation if you snore a lot or have excessive sleepiness.  Take medicines only as directed by your health care provider. ? For some people, aspirin or blood thinners (anticoagulants) are helpful in reducing the risk of forming abnormal blood clots that can lead to stroke. If you have the irregular heart rhythm of atrial fibrillation, you should be on a blood thinner unless there is a good reason you cannot take them. ? Understand all your medicine instructions.  Make sure that other conditions (such as anemia or atherosclerosis) are addressed. Get help right away if:  You have sudden weakness or numbness of the face, arm, or leg, especially on one side of the body.  Your face or eyelid droops to one side.  You have sudden confusion.  You have trouble speaking (aphasia) or understanding.  You have sudden trouble seeing in one or both eyes.  You have sudden trouble walking.  You have dizziness.  You have a loss of balance or coordination.  You have a sudden, severe headache with no known cause.  You have new chest pain or an irregular heartbeat. Any of  these symptoms may represent a serious problem that is an emergency. Do not wait to see if the symptoms will go away. Get medical help at once. Call your local emergency services (911 in [place] Do not drive yourself to the hospital. This information is not intended to replace advice given to you by your health care provider. Make sure you discuss any questions you have with your health care provider. Document Released: 05/06/2004 Document Revised: 09/04/2015 Document Reviewed: 09/29/2012 Elsevier Interactive Patient Education  2017 [employer].

## 2016-10-06 NOTE — Progress Notes (Signed)
I have read the note, and I agree with the clinical assessment and plan.  Onica Davidovich KEITH   

## 2016-10-21 ENCOUNTER — Encounter: Payer: Self-pay | Admitting: Family Medicine

## 2016-10-21 ENCOUNTER — Other Ambulatory Visit: Payer: Self-pay | Admitting: *Deleted

## 2016-10-21 MED ORDER — ROSUVASTATIN CALCIUM 40 MG PO TABS: 40.0000 mg | ORAL_TABLET | Freq: Every day | ORAL | 1 refills | Status: DC

## 2016-10-21 NOTE — Telephone Encounter (Signed)
Rx done. 

## 2016-10-25 ENCOUNTER — Encounter: Payer: Self-pay | Admitting: Family Medicine

## 2016-10-25 ENCOUNTER — Ambulatory Visit (INDEPENDENT_AMBULATORY_CARE_PROVIDER_SITE_OTHER): Payer: Medicare Other | Admitting: *Deleted

## 2016-10-25 DIAGNOSIS — I639 Cerebral infarction, unspecified: Secondary | ICD-10-CM | POA: Diagnosis not present

## 2016-10-26 NOTE — Progress Notes (Signed)
[facility] Summary Report / Loop Recorder 

## 2016-10-28 MED ORDER — PANTOPRAZOLE SODIUM 40 MG PO TBEC: 40.0000 mg | DELAYED_RELEASE_TABLET | Freq: Every day | ORAL | 0 refills | Status: DC

## 2016-10-28 MED ORDER — GABAPENTIN 600 MG PO TABS: ORAL_TABLET | ORAL | 0 refills | Status: DC

## 2016-11-04 LAB — CUP PACEART REMOTE DEVICE CHECK
Date Time Interrogation Session: 20180716193950
MDC IDC PG IMPLANT DT: 20171019

## 2016-11-04 NOTE — Progress Notes (Signed)
Carelink summary report received. Battery status OK. Normal device function. No new symptom episodes, tachy episodes, brady, or pause episodes. No new AF episodes. Monthly summary reports and ROV/PRN 

## 2016-11-11 DIAGNOSIS — M65312 Trigger thumb, left thumb: Secondary | ICD-10-CM | POA: Diagnosis not present

## 2016-11-24 ENCOUNTER — Ambulatory Visit (INDEPENDENT_AMBULATORY_CARE_PROVIDER_SITE_OTHER): Payer: Medicare Other | Admitting: *Deleted

## 2016-11-24 DIAGNOSIS — I639 Cerebral infarction, unspecified: Secondary | ICD-10-CM | POA: Diagnosis not present

## 2016-11-27 ENCOUNTER — Encounter: Payer: Self-pay | Admitting: Family Medicine

## 2016-11-29 ENCOUNTER — Encounter: Payer: Self-pay | Admitting: Family Medicine

## 2016-11-29 MED ORDER — CYCLOBENZAPRINE HCL 10 MG PO TABS: 10.0000 mg | ORAL_TABLET | Freq: Every day | ORAL | 0 refills | Status: DC

## 2016-11-30 ENCOUNTER — Telehealth: Payer: Self-pay

## 2016-11-30 NOTE — Telephone Encounter (Signed)
Received PA request for Cyclobenzaprine 10 mg tablets. PA submitted & is pending. Key: [id number]

## 2016-12-02 LAB — CUP PACEART REMOTE DEVICE CHECK
Implantable Pulse Generator Implant Date: 20171019
MDC IDC SESS DTM: 20180815201031

## 2016-12-02 NOTE — Progress Notes (Signed)
Loop recorder summary report 

## 2016-12-09 DIAGNOSIS — M654 Radial styloid tenosynovitis [de Quervain]: Secondary | ICD-10-CM | POA: Diagnosis not present

## 2016-12-09 DIAGNOSIS — M65312 Trigger thumb, left thumb: Secondary | ICD-10-CM | POA: Diagnosis not present

## 2016-12-14 DIAGNOSIS — L814 Other melanin hyperpigmentation: Secondary | ICD-10-CM | POA: Diagnosis not present

## 2016-12-14 DIAGNOSIS — D1801 Hemangioma of skin and subcutaneous tissue: Secondary | ICD-10-CM | POA: Diagnosis not present

## 2016-12-14 DIAGNOSIS — D225 Melanocytic nevi of trunk: Secondary | ICD-10-CM | POA: Diagnosis not present

## 2016-12-14 DIAGNOSIS — L821 Other seborrheic keratosis: Secondary | ICD-10-CM | POA: Diagnosis not present

## 2016-12-17 NOTE — Telephone Encounter (Signed)
PA approved, form faxed back to pharmacy. 

## 2016-12-24 ENCOUNTER — Ambulatory Visit (INDEPENDENT_AMBULATORY_CARE_PROVIDER_SITE_OTHER): Payer: Medicare Other | Admitting: *Deleted

## 2016-12-24 DIAGNOSIS — I639 Cerebral infarction, unspecified: Secondary | ICD-10-CM | POA: Diagnosis not present

## 2016-12-27 NOTE — Progress Notes (Signed)
[facility] Summary Report / Loop Recorder 

## 2016-12-28 LAB — CUP PACEART REMOTE DEVICE CHECK
Date Time Interrogation Session: 20180914204159
MDC IDC PG IMPLANT DT: 20171019

## 2016-12-30 ENCOUNTER — Encounter: Payer: Self-pay | Admitting: Family Medicine

## 2017-01-06 DIAGNOSIS — M654 Radial styloid tenosynovitis [de Quervain]: Secondary | ICD-10-CM | POA: Diagnosis not present

## 2017-01-06 DIAGNOSIS — M65312 Trigger thumb, left thumb: Secondary | ICD-10-CM | POA: Diagnosis not present

## 2017-01-18 ENCOUNTER — Encounter: Payer: Self-pay | Admitting: Family Medicine

## 2017-01-20 MED ORDER — ROSUVASTATIN CALCIUM 40 MG PO TABS: 40.0000 mg | ORAL_TABLET | Freq: Every day | ORAL | 0 refills | Status: DC

## 2017-01-20 MED ORDER — PANTOPRAZOLE SODIUM 40 MG PO TBEC: 40.0000 mg | DELAYED_RELEASE_TABLET | Freq: Every day | ORAL | 0 refills | Status: DC

## 2017-01-20 MED ORDER — GABAPENTIN 600 MG PO TABS: ORAL_TABLET | ORAL | 0 refills | Status: DC

## 2017-01-24 ENCOUNTER — Ambulatory Visit (INDEPENDENT_AMBULATORY_CARE_PROVIDER_SITE_OTHER): Payer: Medicare Other | Admitting: *Deleted

## 2017-01-24 DIAGNOSIS — I639 Cerebral infarction, unspecified: Secondary | ICD-10-CM | POA: Diagnosis not present

## 2017-01-24 NOTE — Progress Notes (Signed)
[facility] Summary Report / Loop Recorder 

## 2017-01-26 LAB — CUP PACEART REMOTE DEVICE CHECK
Implantable Pulse Generator Implant Date: 20171019
MDC IDC SESS DTM: 20181014204229

## 2017-02-01 ENCOUNTER — Encounter: Payer: Self-pay | Admitting: Family Medicine

## 2017-02-04 ENCOUNTER — Encounter: Payer: Self-pay | Admitting: Family Medicine

## 2017-02-04 MED ORDER — BUTALBITAL-APAP-CAFF-COD 50-325-40-30 MG PO CAPS: 1.0000 | ORAL_CAPSULE | ORAL | 0 refills | Status: DC | PRN

## 2017-02-22 ENCOUNTER — Ambulatory Visit (INDEPENDENT_AMBULATORY_CARE_PROVIDER_SITE_OTHER): Payer: Medicare Other | Admitting: *Deleted

## 2017-02-22 DIAGNOSIS — I639 Cerebral infarction, unspecified: Secondary | ICD-10-CM | POA: Diagnosis not present

## 2017-02-23 NOTE — Progress Notes (Signed)
[facility] Summary Report / Loop Recorder 

## 2017-02-25 ENCOUNTER — Ambulatory Visit (INDEPENDENT_AMBULATORY_CARE_PROVIDER_SITE_OTHER): Payer: Medicare Other | Admitting: Family Medicine

## 2017-02-25 ENCOUNTER — Encounter: Payer: Self-pay | Admitting: Family Medicine

## 2017-02-25 VITALS — BP 110/70 | HR 89 | Temp 98.1°F | Ht 65.5 in | Wt 180.8 lb

## 2017-02-25 DIAGNOSIS — Z23 Encounter for immunization: Secondary | ICD-10-CM | POA: Diagnosis not present

## 2017-02-25 DIAGNOSIS — Z0000 Encounter for general adult medical examination without abnormal findings: Secondary | ICD-10-CM | POA: Diagnosis not present

## 2017-02-25 MED ORDER — CYCLOBENZAPRINE HCL 10 MG PO TABS: 10.0000 mg | ORAL_TABLET | Freq: Every day | ORAL | 1 refills | Status: DC
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
## 2017-02-25 NOTE — Patient Instructions (Signed)
Let me know if you change your mind regarding colonoscopy Check on coverage for new shingles vaccine [facility]) if you are interested.

## 2017-02-26 LAB — CBC WITH DIFFERENTIAL/PLATELET
Basophils Absolute: 57 cells/uL (ref 0–200)
Basophils Relative: 0.7 %
EOS PCT: 1.2 %
Eosinophils Absolute: 97 cells/uL (ref 15–500)
HEMATOCRIT: 38.8 % (ref 35.0–45.0)
HEMOGLOBIN: 13.3 g/dL (ref 11.7–15.5)
LYMPHS ABS: 2568 {cells}/uL (ref 850–3900)
MCH: 29.9 pg (ref 27.0–33.0)
MCHC: 34.3 g/dL (ref 32.0–36.0)
MCV: 87.2 fL (ref 80.0–100.0)
MPV: 10.7 fL (ref 7.5–12.5)
Monocytes Relative: 7 %
NEUTROS PCT: 59.4 %
Neutro Abs: 4811 cells/uL (ref 1500–7800)
Platelets: 225 10*3/uL (ref 140–400)
RBC: 4.45 10*6/uL (ref 3.80–5.10)
RDW: 11.9 % (ref 11.0–15.0)
Total Lymphocyte: 31.7 %
WBC mixed population: 567 cells/uL (ref 200–950)
WBC: 8.1 10*3/uL (ref 3.8–10.8)

## 2017-02-26 LAB — BASIC METABOLIC PANEL
BUN/Creatinine Ratio: 15 (calc) (ref 6–22)
BUN: 16 mg/dL (ref 7–25)
CALCIUM: 9.4 mg/dL (ref 8.6–10.4)
CO2: 27 mmol/L (ref 20–32)
Chloride: 103 mmol/L (ref 98–110)
Creat: 1.1 mg/dL — ABNORMAL HIGH (ref 0.50–0.99)
Glucose, Bld: 93 mg/dL (ref 65–99)
POTASSIUM: 4.1 mmol/L (ref 3.5–5.3)
Sodium: 141 mmol/L (ref 135–146)

## 2017-02-26 LAB — HEPATIC FUNCTION PANEL
AG RATIO: 2.3 (calc) (ref 1.0–2.5)
ALBUMIN MSPROF: 4.3 g/dL (ref 3.6–5.1)
ALT: 27 U/L (ref 6–29)
AST: 26 U/L (ref 10–35)
Alkaline phosphatase (APISO): 92 U/L (ref 33–130)
BILIRUBIN DIRECT: 0.1 mg/dL (ref 0.0–0.2)
BILIRUBIN TOTAL: 0.4 mg/dL (ref 0.2–1.2)
GLOBULIN: 1.9 g/dL (ref 1.9–3.7)
Indirect Bilirubin: 0.3 mg/dL (calc) (ref 0.2–1.2)
Total Protein: 6.2 g/dL (ref 6.1–8.1)

## 2017-02-26 LAB — TSH: TSH: 1 m[IU]/L (ref 0.40–4.50)

## 2017-02-26 LAB — LIPID PANEL
CHOL/HDL RATIO: 2.6 (calc) (ref <5.0)
Cholesterol: 143 mg/dL (ref <200)
HDL: 54 mg/dL (ref >50)
LDL CHOLESTEROL (CALC): 69 mg/dL
NON-HDL CHOLESTEROL (CALC): 89 mg/dL (ref <130)
TRIGLYCERIDES: 113 mg/dL (ref <150)

## 2017-02-26 LAB — HEPATITIS C ANTIBODY
Hepatitis C Ab: NONREACTIVE
SIGNAL TO CUT-OFF: 0.01 (ref <1.00)

## 2017-02-27 ENCOUNTER — Encounter: Payer: Self-pay | Admitting: Family Medicine

## 2017-03-11 LAB — CUP PACEART REMOTE DEVICE CHECK
Implantable Pulse Generator Implant Date: 20171019
MDC IDC SESS DTM: 20181113210704

## 2017-03-12 LAB — HM DEXA SCAN

## 2017-03-23 DIAGNOSIS — M8588 Other specified disorders of bone density and structure, other site: Secondary | ICD-10-CM | POA: Diagnosis not present

## 2017-03-23 DIAGNOSIS — Z1231 Encounter for screening mammogram for malignant neoplasm of breast: Secondary | ICD-10-CM | POA: Diagnosis not present

## 2017-03-23 DIAGNOSIS — Z6829 Body mass index (BMI) 29.0-29.9, adult: Secondary | ICD-10-CM | POA: Diagnosis not present

## 2017-03-23 DIAGNOSIS — Z01419 Encounter for gynecological examination (general) (routine) without abnormal findings: Secondary | ICD-10-CM | POA: Diagnosis not present

## 2017-03-24 ENCOUNTER — Ambulatory Visit (INDEPENDENT_AMBULATORY_CARE_PROVIDER_SITE_OTHER): Payer: Medicare Other | Admitting: *Deleted

## 2017-03-24 DIAGNOSIS — I639 Cerebral infarction, unspecified: Secondary | ICD-10-CM

## 2017-03-24 DIAGNOSIS — L820 Inflamed seborrheic keratosis: Secondary | ICD-10-CM | POA: Diagnosis not present

## 2017-03-25 NOTE — Progress Notes (Signed)
[facility] Summary Report / Loop Recorder 

## 2017-03-29 ENCOUNTER — Encounter: Payer: Self-pay | Admitting: Family Medicine

## 2017-03-30 MED ORDER — BUTALBITAL-APAP-CAFFEINE 50-325-40 MG PO TABS: 1.0000 | ORAL_TABLET | Freq: Two times a day (BID) | ORAL | 0 refills | Status: DC | PRN

## 2017-04-04 LAB — CUP PACEART REMOTE DEVICE CHECK
Date Time Interrogation Session: 20181213211012
MDC IDC PG IMPLANT DT: 20171019

## 2017-04-15 ENCOUNTER — Telehealth: Payer: Self-pay | Admitting: *Deleted

## 2017-04-15 NOTE — Telephone Encounter (Signed)
Prior auth for Fioricet 50-325-40mg  tab sent to [email or web address].

## 2017-04-25 ENCOUNTER — Ambulatory Visit (INDEPENDENT_AMBULATORY_CARE_PROVIDER_SITE_OTHER): Payer: Medicare Other | Admitting: *Deleted

## 2017-04-25 DIAGNOSIS — I639 Cerebral infarction, unspecified: Secondary | ICD-10-CM | POA: Diagnosis not present

## 2017-04-26 NOTE — Progress Notes (Signed)
[facility] Summary Report / Loop Recorder 

## 2017-04-28 ENCOUNTER — Encounter: Payer: Self-pay | Admitting: Family Medicine

## 2017-05-02 MED ORDER — ROSUVASTATIN CALCIUM 40 MG PO TABS: 40.0000 mg | ORAL_TABLET | Freq: Every day | ORAL | 3 refills | Status: DC

## 2017-05-02 MED ORDER — GABAPENTIN 600 MG PO TABS: ORAL_TABLET | ORAL | 3 refills | Status: DC

## 2017-05-02 MED ORDER — PANTOPRAZOLE SODIUM 40 MG PO TBEC: 40.0000 mg | DELAYED_RELEASE_TABLET | Freq: Every day | ORAL | 3 refills | Status: DC

## 2017-05-06 LAB — CUP PACEART REMOTE DEVICE CHECK
Date Time Interrogation Session: 20190112214017
Implantable Pulse Generator Implant Date: 20171019

## 2017-05-10 DIAGNOSIS — H2512 Age-related nuclear cataract, left eye: Secondary | ICD-10-CM | POA: Diagnosis not present

## 2017-05-20 DIAGNOSIS — H25812 Combined forms of age-related cataract, left eye: Secondary | ICD-10-CM | POA: Diagnosis not present

## 2017-05-20 DIAGNOSIS — H2512 Age-related nuclear cataract, left eye: Secondary | ICD-10-CM | POA: Diagnosis not present

## 2017-05-23 ENCOUNTER — Ambulatory Visit (INDEPENDENT_AMBULATORY_CARE_PROVIDER_SITE_OTHER): Payer: Medicare Other | Admitting: *Deleted

## 2017-05-23 DIAGNOSIS — I639 Cerebral infarction, unspecified: Secondary | ICD-10-CM | POA: Diagnosis not present

## 2017-05-24 NOTE — Progress Notes (Signed)
[facility] Summary Report / Loop Recorder 

## 2017-06-02 DIAGNOSIS — H2511 Age-related nuclear cataract, right eye: Secondary | ICD-10-CM | POA: Diagnosis not present

## 2017-06-02 DIAGNOSIS — H25811 Combined forms of age-related cataract, right eye: Secondary | ICD-10-CM | POA: Diagnosis not present

## 2017-06-07 ENCOUNTER — Encounter: Payer: Self-pay | Admitting: Family Medicine

## 2017-06-08 MED ORDER — CYCLOBENZAPRINE HCL 10 MG PO TABS: 10.0000 mg | ORAL_TABLET | Freq: Every day | ORAL | 1 refills | Status: DC

## 2017-06-21 LAB — CUP PACEART REMOTE DEVICE CHECK
Implantable Pulse Generator Implant Date: 20171019
MDC IDC SESS DTM: 20190212011922

## 2017-06-22 ENCOUNTER — Ambulatory Visit: Payer: Medicare Other | Admitting: Family Medicine

## 2017-06-22 ENCOUNTER — Encounter: Payer: Self-pay | Admitting: Family Medicine

## 2017-06-22 VITALS — BP 110/70 | HR 110 | Temp 98.4°F | Wt 184.2 lb

## 2017-06-22 DIAGNOSIS — B9789 Other viral agents as the cause of diseases classified elsewhere: Secondary | ICD-10-CM

## 2017-06-22 DIAGNOSIS — J069 Acute upper respiratory infection, unspecified: Secondary | ICD-10-CM
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
## 2017-06-22 NOTE — Patient Instructions (Signed)
Suspect viral illness, Stay well hydrated Continue Mucinex DM as needed for cough.   Follow up for any fever.

## 2017-06-23 ENCOUNTER — Encounter: Payer: Self-pay | Admitting: Family Medicine

## 2017-06-24 MED ORDER — AMOXICILLIN-POT CLAVULANATE 875-125 MG PO TABS: 1.0000 | ORAL_TABLET | Freq: Two times a day (BID) | ORAL | 0 refills | Status: DC

## 2017-06-27 ENCOUNTER — Ambulatory Visit (INDEPENDENT_AMBULATORY_CARE_PROVIDER_SITE_OTHER): Payer: Medicare Other | Admitting: *Deleted

## 2017-06-27 ENCOUNTER — Encounter: Payer: Self-pay | Admitting: Family Medicine

## 2017-06-27 DIAGNOSIS — I639 Cerebral infarction, unspecified: Secondary | ICD-10-CM

## 2017-06-27 MED ORDER — BENZONATATE 100 MG PO CAPS: ORAL_CAPSULE | ORAL | 0 refills | Status: DC

## 2017-06-27 NOTE — Progress Notes (Signed)
[facility] Summary Report / Loop Recorder 

## 2017-07-08 ENCOUNTER — Encounter: Payer: Self-pay | Admitting: Family Medicine

## 2017-07-08 MED ORDER — HYDROCODONE-HOMATROPINE 5-1.5 MG/5ML PO SYRP: 5.0000 mL | ORAL_SOLUTION | Freq: Four times a day (QID) | ORAL | 0 refills | Status: DC | PRN

## 2017-07-28 ENCOUNTER — Ambulatory Visit (INDEPENDENT_AMBULATORY_CARE_PROVIDER_SITE_OTHER): Payer: Medicare Other | Admitting: *Deleted

## 2017-07-28 DIAGNOSIS — I639 Cerebral infarction, unspecified: Secondary | ICD-10-CM

## 2017-07-29 NOTE — Progress Notes (Signed)
[facility] Summary Report / Loop Recorder 

## 2017-08-02 LAB — CUP PACEART REMOTE DEVICE CHECK
Date Time Interrogation Session: 20190316223639
Implantable Pulse Generator Implant Date: 20171019

## 2017-08-16 ENCOUNTER — Encounter: Payer: Self-pay | Admitting: Family Medicine

## 2017-08-17 MED ORDER — LISINOPRIL 5 MG PO TABS: 5.0000 mg | ORAL_TABLET | Freq: Every day | ORAL | 0 refills | Status: DC

## 2017-08-17 MED ORDER — BUTALBITAL-APAP-CAFFEINE 50-325-40 MG PO TABS: 1.0000 | ORAL_TABLET | Freq: Two times a day (BID) | ORAL | 0 refills | Status: DC | PRN

## 2017-08-17 MED ORDER — CYCLOBENZAPRINE HCL 10 MG PO TABS: 10.0000 mg | ORAL_TABLET | Freq: Every day | ORAL | 0 refills | Status: DC

## 2017-08-18 ENCOUNTER — Telehealth: Payer: Self-pay | Admitting: Family Medicine

## 2017-08-18 NOTE — Telephone Encounter (Signed)
Copied from [place] [id number]. Topic: Quick Communication - See Telephone Encounter >> Aug 18, 2017  1:34 PM Bea Graff, NT wrote: CRM for notification. See Telephone encounter for: 08/18/17. [place] needing a call to approve to refill the cyclobenzaprine (FLEXERIL) 10 MG tablet early. It is not due for a refill until 09/02/17.

## 2017-08-19 NOTE — Telephone Encounter (Signed)
Refill early this time- I think early request secondary to her going out of town

## 2017-08-19 NOTE — Telephone Encounter (Signed)
Okay for early refill.

## 2017-08-19 NOTE — Telephone Encounter (Signed)
Spoke with pharmacist  

## 2017-08-26 LAB — CUP PACEART REMOTE DEVICE CHECK
Date Time Interrogation Session: 20190418233559
MDC IDC PG IMPLANT DT: 20171019

## 2017-08-30 ENCOUNTER — Ambulatory Visit (INDEPENDENT_AMBULATORY_CARE_PROVIDER_SITE_OTHER): Payer: Medicare Other | Admitting: *Deleted

## 2017-08-30 DIAGNOSIS — I639 Cerebral infarction, unspecified: Secondary | ICD-10-CM

## 2017-08-31 NOTE — Progress Notes (Signed)
[facility] Summary Report / Loop Recorder 

## 2017-09-07 ENCOUNTER — Telehealth: Payer: Self-pay | Admitting: Cardiology

## 2017-09-07 NOTE — Telephone Encounter (Signed)
LMOVM requesting that pt send manual transmission b/c home monitor has not updated in at least 14 days.    

## 2017-09-13 ENCOUNTER — Encounter: Payer: Self-pay | Admitting: Cardiology

## 2017-09-26 LAB — CUP PACEART REMOTE DEVICE CHECK
Date Time Interrogation Session: 20190522000808
MDC IDC PG IMPLANT DT: 20171019

## 2017-09-30 ENCOUNTER — Encounter: Payer: Self-pay | Admitting: Family Medicine

## 2017-10-03 ENCOUNTER — Ambulatory Visit (INDEPENDENT_AMBULATORY_CARE_PROVIDER_SITE_OTHER): Payer: Medicare Other | Admitting: *Deleted

## 2017-10-03 DIAGNOSIS — I639 Cerebral infarction, unspecified: Secondary | ICD-10-CM | POA: Diagnosis not present

## 2017-10-03 NOTE — Progress Notes (Signed)
[facility] Summary Report / Loop Recorder 

## 2017-10-05 ENCOUNTER — Telehealth: Payer: Self-pay | Admitting: Family Medicine

## 2017-10-05 NOTE — Telephone Encounter (Signed)
Patient dropped off disability placard form  Call the patient to pick up form at: [phone]  Disposition: Dr's Folder

## 2017-10-05 NOTE — Telephone Encounter (Signed)
Placed in Dr Burchette's folder 
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
## 2017-10-10 ENCOUNTER — Ambulatory Visit (INDEPENDENT_AMBULATORY_CARE_PROVIDER_SITE_OTHER): Payer: Medicare Other

## 2017-10-10 VITALS — BP 124/70 | HR 76 | Ht 66.0 in | Wt 184.4 lb

## 2017-10-10 DIAGNOSIS — Z0000 Encounter for general adult medical examination without abnormal findings: Secondary | ICD-10-CM

## 2017-10-10 NOTE — Patient Instructions (Addendum)
Andrea [name] , Thank you for taking time to come for your Medicare Wellness Visit. I appreciate your ongoing commitment to your health goals. Please review the following plan we discussed and let me know if I can assist you in the future.   Deaf & Hard of Hearing Division Services - can assist with hearing aid x 1  No reviews  [employer] [facility]  [phone]  [phone] - [place]  [email or web address]  Shingrix is a vaccine for the prevention of Shingles in Adults 50 and older.  If you are on Medicare, the shingrix is covered under your Part D plan, so you will take both of the vaccines in the series at your pharmacy. Please check with your benefits regarding applicable copays or out of pocket expenses.  The Shingrix is given in 2 vaccines approx 8 weeks apart. You must receive the 2nd dose prior to 6 months from receipt of the first. Please have the pharmacist print out you Immunization  dates for our office records   You can see if insurance will pay for the cologuard and discuss with Dr. Elease Hashimoto at your annual visit.      These are the goals we discussed:  Goals    . Patient Stated     To maintain your health Continue to walk your dog!        This is a list of the screening recommended for you and due dates:  Health Maintenance  Topic Date Due  . Colon Cancer Screening  02/25/2018*  . Flu Shot  11/10/2017  . Pneumonia vaccines (2 of 2 - PPSV23) 02/25/2018  . Mammogram  03/01/2018  . Tetanus Vaccine  07/04/2019  . DEXA scan (bone density measurement)  Completed  .  Hepatitis C: One time screening is recommended by Center for Disease Control  (CDC) for  adults born from [birth year] through 1964.   Completed  *Topic was postponed. The date shown is not the original due date.      Fall Prevention in the Home Falls can cause injuries. They can happen to people of all  ages. There are many things you can do to make your home safe and to help prevent falls. What can I do on the outside of my home?  Regularly fix the edges of walkways and driveways and fix any cracks.  Remove anything that might make you trip as you walk through a door, such as a raised step or threshold.  Trim any bushes or trees on the path to your home.  Use bright outdoor lighting.  Clear any walking paths of anything that might make someone trip, such as rocks or tools.  Regularly check to see if handrails are loose or broken. Make sure that both sides of any steps have handrails.  Any raised decks and porches should have guardrails on the edges.  Have any leaves, snow, or ice cleared regularly.  Use sand or salt on walking paths during winter.  Clean up any spills in your garage right away. This includes oil or grease spills. What can I do in the bathroom?  Use night lights.  Install grab bars by the toilet and in the tub and shower. Do not use towel bars as grab bars.  Use non-skid mats or decals in the tub or shower.  If you need to sit down in the shower, use a plastic, non-slip stool.  Keep the floor dry. Clean up any water that spills on the  floor as soon as it happens.  Remove soap buildup in the tub or shower regularly.  Attach bath mats securely with double-sided non-slip rug tape.  Do not have throw rugs and other things on the floor that can make you trip. What can I do in the bedroom?  Use night lights.  Make sure that you have a light by your bed that is easy to reach.  Do not use any sheets or blankets that are too big for your bed. They should not hang down onto the floor.  Have a firm chair that has side arms. You can use this for support while you get dressed.  Do not have throw rugs and other things on the floor that can make you trip. What can I do in the kitchen?  Clean up any spills right away.  Avoid walking on wet floors.  Keep items  that you use a lot in easy-to-reach places.  If you need to reach something above you, use a strong step stool that has a grab bar.  Keep electrical cords out of the way.  Do not use floor polish or wax that makes floors slippery. If you must use wax, use non-skid floor wax.  Do not have throw rugs and other things on the floor that can make you trip. What can I do with my stairs?  Do not leave any items on the stairs.  Make sure that there are handrails on both sides of the stairs and use them. Fix handrails that are broken or loose. Make sure that handrails are as long as the stairways.  Check any carpeting to make sure that it is firmly attached to the stairs. Fix any carpet that is loose or worn.  Avoid having throw rugs at the top or bottom of the stairs. If you do have throw rugs, attach them to the floor with carpet tape.  Make sure that you have a light switch at the top of the stairs and the bottom of the stairs. If you do not have them, ask someone to add them for you. What else can I do to help prevent falls?  Wear shoes that: ? Do not have high heels. ? Have rubber bottoms. ? Are comfortable and fit you well. ? Are closed at the toe. Do not wear sandals.  If you use a stepladder: ? Make sure that it is fully opened. Do not climb a closed stepladder. ? Make sure that both sides of the stepladder are locked into place. ? Ask someone to hold it for you, if possible.  Clearly mark and make sure that you can see: ? Any grab bars or handrails. ? First and last steps. ? Where the edge of each step is.  Use tools that help you move around (mobility aids) if they are needed. These include: ? Canes. ? Walkers. ? Scooters. ? Crutches.  Turn on the lights when you go into a dark area. Replace any light bulbs as soon as they burn out.  Set up your furniture so you have a clear path. Avoid moving your furniture around.  If any of your floors are uneven, fix them.  If  there are any pets around you, be aware of where they are.  Review your medicines with your doctor. Some medicines can make you feel dizzy. This can increase your chance of falling. Ask your doctor what other things that you can do to help prevent falls. This information is not intended to replace  advice given to you by your health care provider. Make sure you discuss any questions you have with your health care provider. Document Released: 01/23/2009 Document Revised: 09/04/2015 Document Reviewed: 05/03/2014 Elsevier Interactive Patient Education  2018 [place] Maintenance, Female Adopting a healthy lifestyle and getting preventive care can go a long way to promote health and wellness. Talk with your health care provider about what schedule of regular examinations is right for you. This is a good chance for you to check in with your provider about disease prevention and staying healthy. In between checkups, there are plenty of things you can do on your own. Experts have done a lot of research about which lifestyle changes and preventive measures are most likely to keep you healthy. Ask your health care provider for more information. Weight and diet Eat a healthy diet  Be sure to include plenty of vegetables, fruits, low-fat dairy products, and lean protein.  Do not eat a lot of foods high in solid fats, added sugars, or salt.  Get regular exercise. This is one of the most important things you can do for your health. ? Most adults should exercise for at least 150 minutes each week. The exercise should increase your heart rate and make you sweat (moderate-intensity exercise). ? Most adults should also do strengthening exercises at least twice a week. This is in addition to the moderate-intensity exercise.  Maintain a healthy weight  Body mass index (BMI) is a measurement that can be used to identify possible weight problems. It estimates body fat based on height and weight. Your  health care provider can help determine your BMI and help you achieve or maintain a healthy weight.  For females 83 years of age and older: ? A BMI below 18.5 is considered underweight. ? A BMI of 18.5 to 24.9 is normal. ? A BMI of 25 to 29.9 is considered overweight. ? A BMI of 30 and above is considered obese.  Watch levels of cholesterol and blood lipids  You should start having your blood tested for lipids and cholesterol at 66 years of age, then have this test every 5 years.  You may need to have your cholesterol levels checked more often if: ? Your lipid or cholesterol levels are high. ? You are older than 66 years of age. ? You are at high risk for heart disease.  Cancer screening Lung Cancer  Lung cancer screening is recommended for adults 24-97 years old who are at high risk for lung cancer because of a history of smoking.  A yearly low-dose CT scan of the lungs is recommended for people who: ? Currently smoke. ? Have quit within the past 15 years. ? Have at least a 30-pack-year history of smoking. A pack year is smoking an average of one pack of cigarettes a day for 1 year.  Yearly screening should continue until it has been 15 years since you quit.  Yearly screening should stop if you develop a health problem that would prevent you from having lung cancer treatment.  Breast Cancer  Practice breast self-awareness. This means understanding how your breasts normally appear and feel.  It also means doing regular breast self-exams. Let your health care provider know about any changes, no matter how small.  If you are in your 20s or 30s, you should have a clinical breast exam (CBE) by a health care provider every 1-3 years as part of a regular health exam.  If you are 40 or older,  have a CBE every year. Also consider having a breast X-ray (mammogram) every year.  If you have a family history of breast cancer, talk to your health care provider about genetic  screening.  If you are at high risk for breast cancer, talk to your health care provider about having an MRI and a mammogram every year.  Breast cancer gene (BRCA) assessment is recommended for women who have family members with BRCA-related cancers. BRCA-related cancers include: ? Breast. ? Ovarian. ? Tubal. ? Peritoneal cancers.  Results of the assessment will determine the need for genetic counseling and BRCA1 and BRCA2 testing.  Cervical Cancer Your health care provider may recommend that you be screened regularly for cancer of the pelvic organs (ovaries, uterus, and vagina). This screening involves a pelvic examination, including checking for microscopic changes to the surface of your cervix (Pap test). You may be encouraged to have this screening done every 3 years, beginning at age 73.  For women ages 57-65, health care providers may recommend pelvic exams and Pap testing every 3 years, or they may recommend the Pap and pelvic exam, combined with testing for human papilloma virus (HPV), every 5 years. Some types of HPV increase your risk of cervical cancer. Testing for HPV may also be done on women of any age with unclear Pap test results.  Other health care providers may not recommend any screening for nonpregnant women who are considered low risk for pelvic cancer and who do not have symptoms. Ask your health care provider if a screening pelvic exam is right for you.  If you have had past treatment for cervical cancer or a condition that could lead to cancer, you need Pap tests and screening for cancer for at least 20 years after your treatment. If Pap tests have been discontinued, your risk factors (such as having a new sexual partner) need to be reassessed to determine if screening should resume. Some women have medical problems that increase the chance of getting cervical cancer. In these cases, your health care provider may recommend more frequent screening and Pap  tests.  Colorectal Cancer  This type of cancer can be detected and often prevented.  Routine colorectal cancer screening usually begins at 66 years of age and continues through 66 years of age.  Your health care provider may recommend screening at an earlier age if you have risk factors for colon cancer.  Your health care provider may also recommend using home test kits to check for hidden blood in the stool.  A small camera at the end of a tube can be used to examine your colon directly (sigmoidoscopy or colonoscopy). This is done to check for the earliest forms of colorectal cancer.  Routine screening usually begins at age 78.  Direct examination of the colon should be repeated every 5-10 years through 66 years of age. However, you may need to be screened more often if early forms of precancerous polyps or small growths are found.  Skin Cancer  Check your skin from head to toe regularly.  Tell your health care provider about any new moles or changes in moles, especially if there is a change in a mole's shape or color.  Also tell your health care provider if you have a mole that is larger than the size of a pencil eraser.  Always use sunscreen. Apply sunscreen liberally and repeatedly throughout the day.  Protect yourself by wearing long sleeves, pants, a wide-brimmed hat, and sunglasses whenever you are outside.  Heart disease, diabetes, and high blood pressure  High blood pressure causes heart disease and increases the risk of stroke. High blood pressure is more likely to develop in: ? People who have blood pressure in the high end of the normal range (130-139/85-89 mm Hg). ? People who are overweight or obese. ? People who are African American.  If you are 34-56 years of age, have your blood pressure checked every 3-5 years. If you are 9 years of age or older, have your blood pressure checked every year. You should have your blood pressure measured twice-once when you are at  a hospital or clinic, and once when you are not at a hospital or clinic. Record the average of the two measurements. To check your blood pressure when you are not at a hospital or clinic, you can use: ? An automated blood pressure machine at a pharmacy. ? A home blood pressure monitor.  If you are between 69 years and 69 years old, ask your health care provider if you should take aspirin to prevent strokes.  Have regular diabetes screenings. This involves taking a blood sample to check your fasting blood sugar level. ? If you are at a normal weight and have a low risk for diabetes, have this test once every three years after 67 years of age. ? If you are overweight and have a high risk for diabetes, consider being tested at a younger age or more often. Preventing infection Hepatitis B  If you have a higher risk for hepatitis B, you should be screened for this virus. You are considered at high risk for hepatitis B if: ? You were born in a country where hepatitis B is common. Ask your health care provider which countries are considered high risk. ? Your parents were born in a high-risk country, and you have not been immunized against hepatitis B (hepatitis B vaccine). ? You have HIV or AIDS. ? You use needles to inject street drugs. ? You live with someone who has hepatitis B. ? You have had sex with someone who has hepatitis B. ? You get hemodialysis treatment. ? You take certain medicines for conditions, including cancer, organ transplantation, and autoimmune conditions.  Hepatitis C  Blood testing is recommended for: ? Everyone born from [birth year] through 1964. ? Anyone with known risk factors for hepatitis C.  Sexually transmitted infections (STIs)  You should be screened for sexually transmitted infections (STIs) including gonorrhea and chlamydia if: ? You are sexually active and are younger than 66 years of age. ? You are older than 66 years of age and your health care provider tells  you that you are at risk for this type of infection. ? Your sexual activity has changed since you were last screened and you are at an increased risk for chlamydia or gonorrhea. Ask your health care provider if you are at risk.  If you do not have HIV, but are at risk, it may be recommended that you take a prescription medicine daily to prevent HIV infection. This is called pre-exposure prophylaxis (PrEP). You are considered at risk if: ? You are sexually active and do not regularly use condoms or know the HIV status of your partner(s). ? You take drugs by injection. ? You are sexually active with a partner who has HIV.  Talk with your health care provider about whether you are at high risk of being infected with HIV. If you choose to begin PrEP, you should first be tested for HIV.  You should then be tested every 3 months for as long as you are taking PrEP. Pregnancy  If you are premenopausal and you may become pregnant, ask your health care provider about preconception counseling.  If you may become pregnant, take 400 to 800 micrograms (mcg) of folic acid every day.  If you want to prevent pregnancy, talk to your health care provider about birth control (contraception). Osteoporosis and menopause  Osteoporosis is a disease in which the bones lose minerals and strength with aging. This can result in serious bone fractures. Your risk for osteoporosis can be identified using a bone density scan.  If you are 71 years of age or older, or if you are at risk for osteoporosis and fractures, ask your health care provider if you should be screened.  Ask your health care provider whether you should take a calcium or vitamin D supplement to lower your risk for osteoporosis.  Menopause may have certain physical symptoms and risks.  Hormone replacement therapy may reduce some of these symptoms and risks. Talk to your health care provider about whether hormone replacement therapy is right for  you. Follow these instructions at home:  Schedule regular health, dental, and eye exams.  Stay current with your immunizations.  Do not use any tobacco products including cigarettes, chewing tobacco, or electronic cigarettes.  If you are pregnant, do not drink alcohol.  If you are breastfeeding, limit how much and how often you drink alcohol.  Limit alcohol intake to no more than 1 drink per day for nonpregnant women. One drink equals 12 ounces of beer, 5 ounces of wine, or 1 ounces of hard liquor.  Do not use street drugs.  Do not share needles.  Ask your health care provider for help if you need support or information about quitting drugs.  Tell your health care provider if you often feel depressed.  Tell your health care provider if you have ever been abused or do not feel safe at home. This information is not intended to replace advice given to you by your health care provider. Make sure you discuss any questions you have with your health care provider. Document Released: 10/12/2010 Document Revised: 09/04/2015 Document Reviewed: 12/31/2014 Elsevier Interactive Patient Education  [employer].

## 2017-11-04 ENCOUNTER — Ambulatory Visit (INDEPENDENT_AMBULATORY_CARE_PROVIDER_SITE_OTHER): Payer: Medicare Other | Admitting: *Deleted

## 2017-11-04 DIAGNOSIS — I639 Cerebral infarction, unspecified: Secondary | ICD-10-CM | POA: Diagnosis not present

## 2017-11-07 NOTE — Progress Notes (Signed)
[facility] Summary Report / Loop Recorder 

## 2017-11-15 LAB — CUP PACEART REMOTE DEVICE CHECK
Implantable Pulse Generator Implant Date: 20171019
MDC IDC SESS DTM: 20190624004007

## 2017-11-22 ENCOUNTER — Telehealth: Payer: Self-pay | Admitting: Cardiology

## 2017-11-22 NOTE — Telephone Encounter (Signed)
Spoke w/ pt and requested that she send a manual transmission b/c her home monitor has not updated in at least 14 days.   

## 2017-12-03 ENCOUNTER — Encounter: Payer: Self-pay | Admitting: Family Medicine

## 2017-12-05 MED ORDER — LISINOPRIL 5 MG PO TABS: 5.0000 mg | ORAL_TABLET | Freq: Every day | ORAL | 0 refills | Status: DC

## 2017-12-07 ENCOUNTER — Ambulatory Visit (INDEPENDENT_AMBULATORY_CARE_PROVIDER_SITE_OTHER): Payer: Medicare Other | Admitting: *Deleted

## 2017-12-07 DIAGNOSIS — I639 Cerebral infarction, unspecified: Secondary | ICD-10-CM | POA: Diagnosis not present

## 2017-12-08 NOTE — Progress Notes (Signed)
[facility] Summary Report / Loop Recorder 

## 2017-12-15 ENCOUNTER — Encounter: Payer: Self-pay | Admitting: Family Medicine

## 2017-12-15 MED ORDER — BUTALBITAL-APAP-CAFFEINE 50-325-40 MG PO TABS: 1.0000 | ORAL_TABLET | Freq: Two times a day (BID) | ORAL | 0 refills | Status: DC | PRN

## 2017-12-19 ENCOUNTER — Encounter: Payer: Self-pay | Admitting: Family Medicine

## 2017-12-19 LAB — CUP PACEART REMOTE DEVICE CHECK
MDC IDC PG IMPLANT DT: 20171019
MDC IDC SESS DTM: 20190727004141

## 2017-12-19 MED ORDER — CYCLOBENZAPRINE HCL 10 MG PO TABS: 10.0000 mg | ORAL_TABLET | Freq: Every day | ORAL | 0 refills | Status: DC

## 2018-01-03 LAB — CUP PACEART REMOTE DEVICE CHECK
Implantable Pulse Generator Implant Date: 20171019
MDC IDC SESS DTM: 20190829014020

## 2018-01-09 ENCOUNTER — Ambulatory Visit (INDEPENDENT_AMBULATORY_CARE_PROVIDER_SITE_OTHER): Payer: Medicare Other | Admitting: *Deleted

## 2018-01-09 DIAGNOSIS — I639 Cerebral infarction, unspecified: Secondary | ICD-10-CM | POA: Diagnosis not present

## 2018-01-10 NOTE — Progress Notes (Signed)
[facility] Summary Report / Loop Recorder 

## 2018-01-11 LAB — CUP PACEART REMOTE DEVICE CHECK
Date Time Interrogation Session: 20191001020736
Implantable Pulse Generator Implant Date: 20171019

## 2018-02-13 ENCOUNTER — Ambulatory Visit (INDEPENDENT_AMBULATORY_CARE_PROVIDER_SITE_OTHER): Payer: Medicare Other | Admitting: *Deleted

## 2018-02-13 DIAGNOSIS — I639 Cerebral infarction, unspecified: Secondary | ICD-10-CM

## 2018-02-13 NOTE — Progress Notes (Signed)
[facility] Summary Report / Loop Recorder 

## 2018-02-16 ENCOUNTER — Encounter: Payer: Self-pay | Admitting: Family Medicine

## 2018-02-17 ENCOUNTER — Other Ambulatory Visit: Payer: Self-pay

## 2018-02-17 MED ORDER — LISINOPRIL 5 MG PO TABS: 5.0000 mg | ORAL_TABLET | Freq: Every day | ORAL | 0 refills | Status: DC

## 2018-02-27 ENCOUNTER — Encounter: Payer: Self-pay | Admitting: Family Medicine

## 2018-02-27 ENCOUNTER — Ambulatory Visit (INDEPENDENT_AMBULATORY_CARE_PROVIDER_SITE_OTHER): Payer: Medicare Other | Admitting: Family Medicine

## 2018-02-27 ENCOUNTER — Other Ambulatory Visit: Payer: Self-pay

## 2018-02-27 VITALS — BP 132/76 | HR 90 | Temp 98.4°F | Ht 64.5 in | Wt 183.6 lb

## 2018-02-27 DIAGNOSIS — Z23 Encounter for immunization: Secondary | ICD-10-CM | POA: Diagnosis not present

## 2018-02-27 DIAGNOSIS — Z0000 Encounter for general adult medical examination without abnormal findings: Secondary | ICD-10-CM | POA: Diagnosis not present

## 2018-02-27 LAB — BASIC METABOLIC PANEL
BUN: 17 mg/dL (ref 6–23)
CALCIUM: 9.8 mg/dL (ref 8.4–10.5)
CO2: 30 mEq/L (ref 19–32)
Chloride: 103 mEq/L (ref 96–112)
Creatinine, Ser: 1.2 mg/dL (ref 0.40–1.20)
GFR: 47.69 mL/min — AB (ref >60.00)
Glucose, Bld: 105 mg/dL — ABNORMAL HIGH (ref 70–99)
POTASSIUM: 4.5 meq/L (ref 3.5–5.1)
SODIUM: 142 meq/L (ref 135–145)

## 2018-02-27 LAB — CBC WITH DIFFERENTIAL/PLATELET
BASOS PCT: 1 % (ref 0.0–3.0)
Basophils Absolute: 0.1 10*3/uL (ref 0.0–0.1)
EOS PCT: 2.3 % (ref 0.0–5.0)
Eosinophils Absolute: 0.1 10*3/uL (ref 0.0–0.7)
HCT: 41.6 % (ref 36.0–46.0)
HEMOGLOBIN: 14.1 g/dL (ref 12.0–15.0)
Lymphocytes Relative: 30.6 % (ref 12.0–46.0)
Lymphs Abs: 1.9 10*3/uL (ref 0.7–4.0)
MCHC: 33.9 g/dL (ref 30.0–36.0)
MCV: 87.3 fl (ref 78.0–100.0)
MONO ABS: 0.4 10*3/uL (ref 0.1–1.0)
MONOS PCT: 7.1 % (ref 3.0–12.0)
Neutro Abs: 3.7 10*3/uL (ref 1.4–7.7)
Neutrophils Relative %: 59 % (ref 43.0–77.0)
Platelets: 205 10*3/uL (ref 150.0–400.0)
RBC: 4.77 Mil/uL (ref 3.87–5.11)
RDW: 12.5 % (ref 11.5–15.5)
WBC: 6.3 10*3/uL (ref 4.0–10.5)

## 2018-02-27 LAB — LIPID PANEL
CHOLESTEROL: 140 mg/dL (ref 0–200)
HDL: 50.3 mg/dL (ref >39.00)
LDL Cholesterol: 62 mg/dL (ref 0–99)
NonHDL: 89.46
Total CHOL/HDL Ratio: 3
Triglycerides: 138 mg/dL (ref 0.0–149.0)
VLDL: 27.6 mg/dL (ref 0.0–40.0)

## 2018-02-27 LAB — HEPATIC FUNCTION PANEL
ALBUMIN: 4.6 g/dL (ref 3.5–5.2)
ALK PHOS: 90 U/L (ref 39–117)
ALT: 20 U/L (ref 0–35)
AST: 20 U/L (ref 0–37)
BILIRUBIN DIRECT: 0.1 mg/dL (ref 0.0–0.3)
Total Bilirubin: 0.5 mg/dL (ref 0.2–1.2)
Total Protein: 6.5 g/dL (ref 6.0–8.3)

## 2018-02-27 LAB — TSH: TSH: 1.56 u[IU]/mL (ref 0.35–4.50)

## 2018-02-27 NOTE — Addendum Note (Signed)
Addended by: Sheffield Slider L on: 02/27/2018 10:16 AM   Modules accepted: Orders

## 2018-02-27 NOTE — Patient Instructions (Signed)
We will set up referral for colonoscopy.  Consider newer shingles vaccine (Shingrix) and check with insurance if interested.
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
## 2018-03-04 LAB — CUP PACEART REMOTE DEVICE CHECK
Implantable Pulse Generator Implant Date: 20171019
MDC IDC SESS DTM: 20191103023901

## 2018-03-05 IMAGING — MR MR HEAD W/O CM
9 of 11 series · 31 of 48 positions shown · non-contrast
Comparison: Prior CT from 01/26/2016.

CLINICAL DATA: Initial evaluation for acute headache, ataxia.
History of migraines. Concern for right PCA stroke on prior CT. The

EXAM:
MRI HEAD WITHOUT CONTRAST
MRA HEAD WITHOUT CONTRAST
TECHNIQUE: Multiplanar, multiecho pulse sequences of the brain and surrounding
structures were obtained without intravenous contrast. Angiographic
images of the head were obtained using MRA technique without
contrast.

[Series 3: T1 · sagittal · 5.0mm · 0.47mm/px · 1 of 23 slices shown]
[im 1/23]
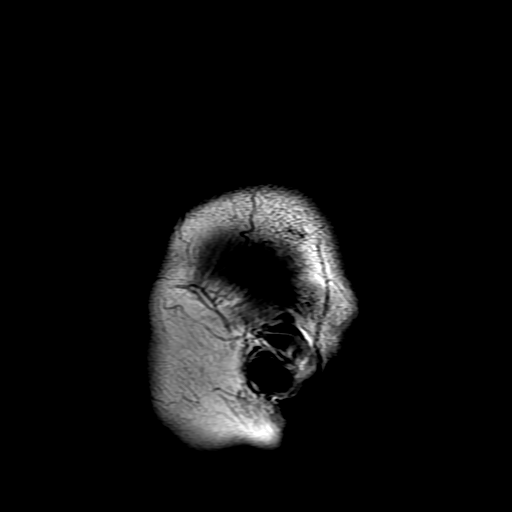

[Series 4: (id) mt fs · axial · 1.4mm · 0.43mm/px · z∈[-60,-23]mm · 4 of 136 slices shown]
[im 1/136]
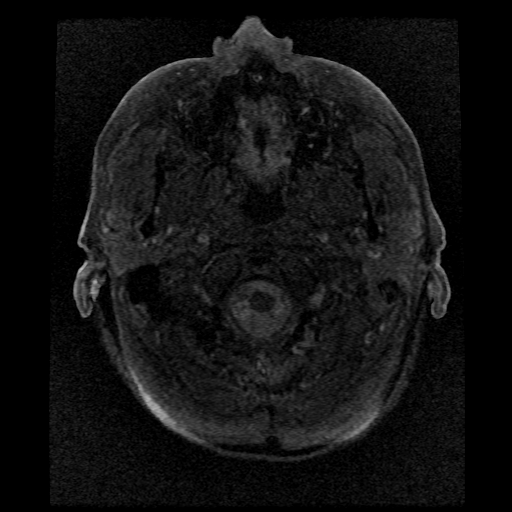
[im 28/136]
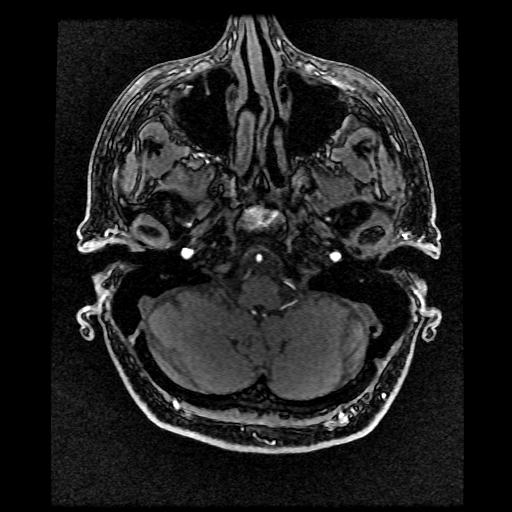
[im 41/136]
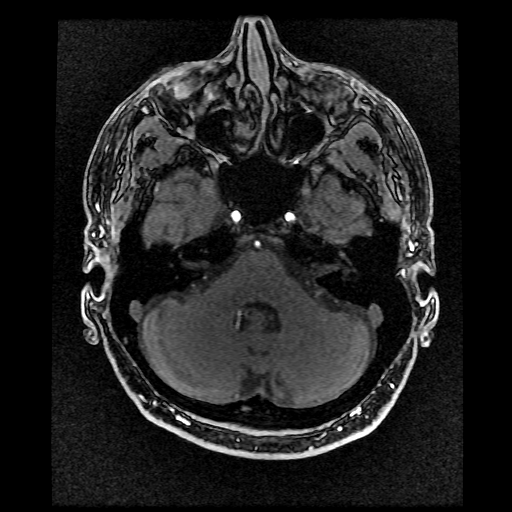
[im 55/136]
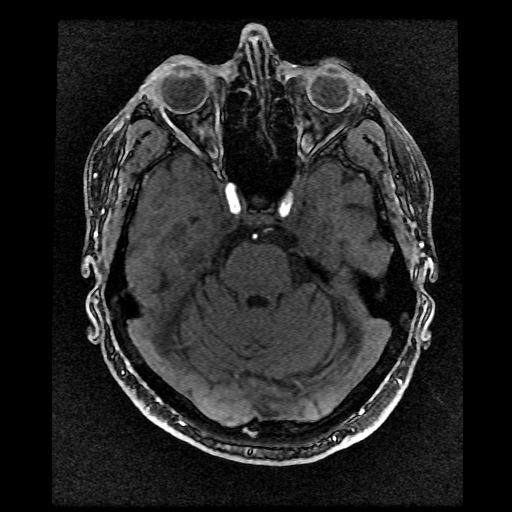

[Series 5: DWI · axial · 3.0mm · 1.09mm/px · z∈[-78,+57]mm · 8 of 92 slices shown (1 of 4)]
[im 1/92]
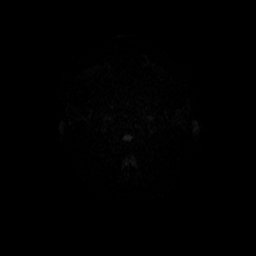
[im 14/92]
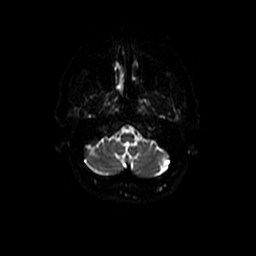
[im 27/92]
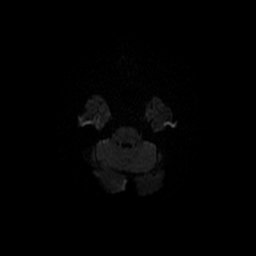
[im 40/92]
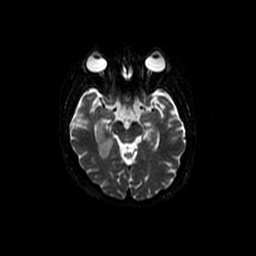
[im 53/92]
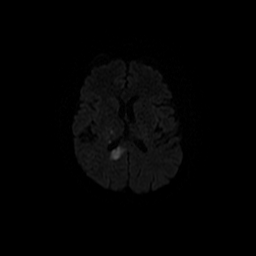
[im 66/92]
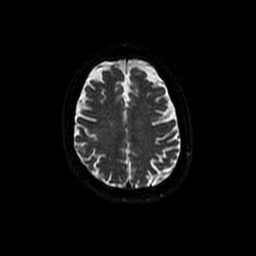
[im 79/92]
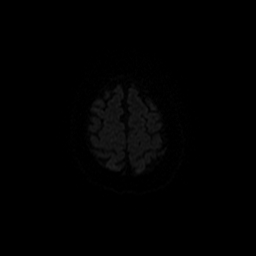
[im 92/92]
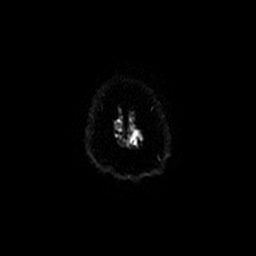

[Series 6: T2 · axial · 5.0mm · 0.43mm/px · z∈[-73,+59]mm · 2 of 23 slices shown (1 of 2)]
[im 1/23]
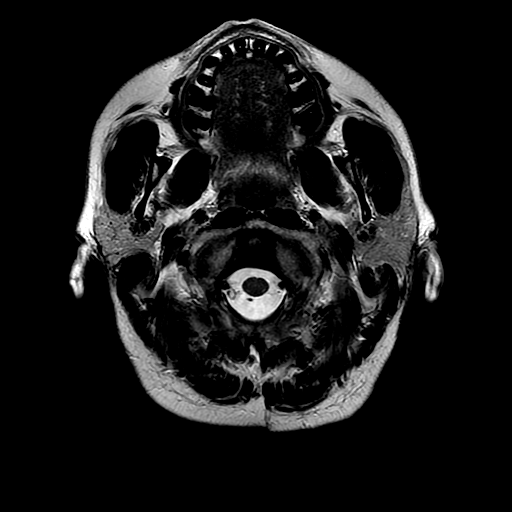
[im 23/23]
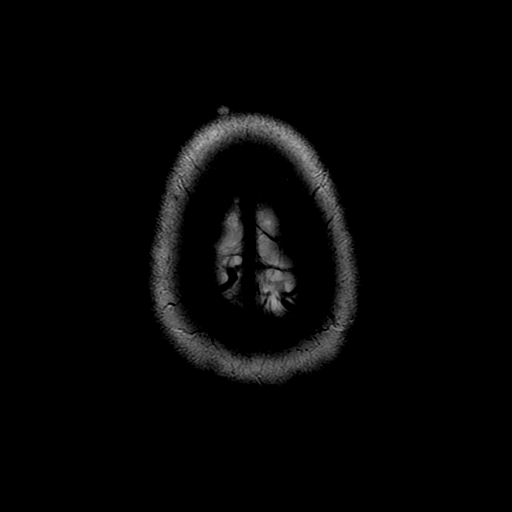

[Series 7: FLAIR · axial · 5.0mm · 0.43mm/px · z∈[-73,+59]mm · 2 of 23 slices shown]
[im 1/23]
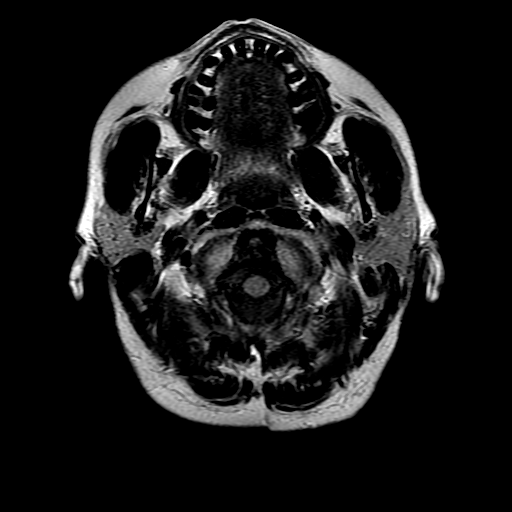
[im 23/23]
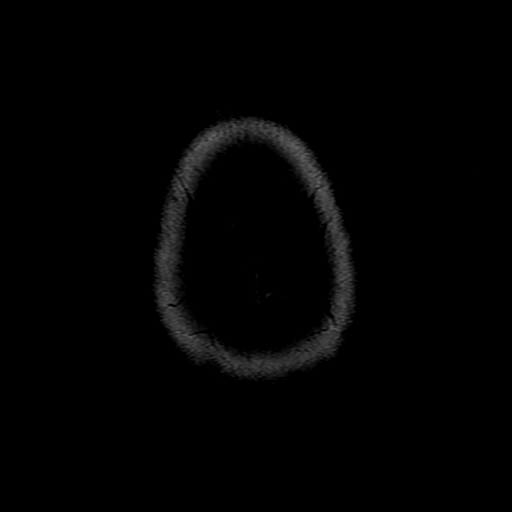

[Series 8: DWI · coronal · 5.0mm · 1.09mm/px · 5 of 66 slices shown (2 of 4)]
[im 1/66]
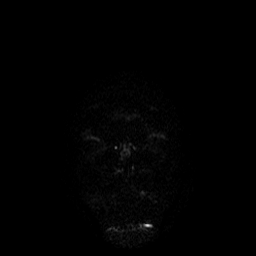
[im 17/66]
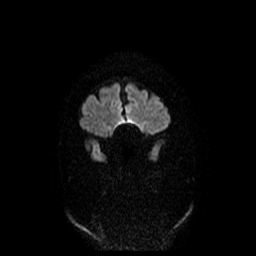
[im 33/66]
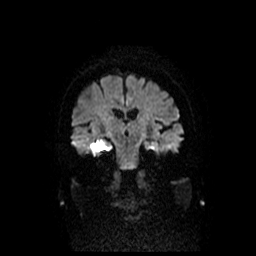
[im 49/66]
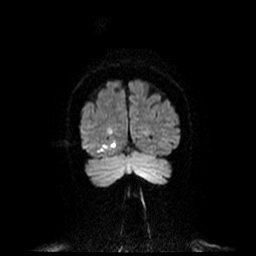
[im 66/66]
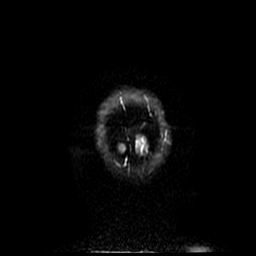

[Series 11: T2 · coronal · 5.0mm · 0.39mm/px · 2 of 25 slices shown (2 of 2)]
[im 1/25]
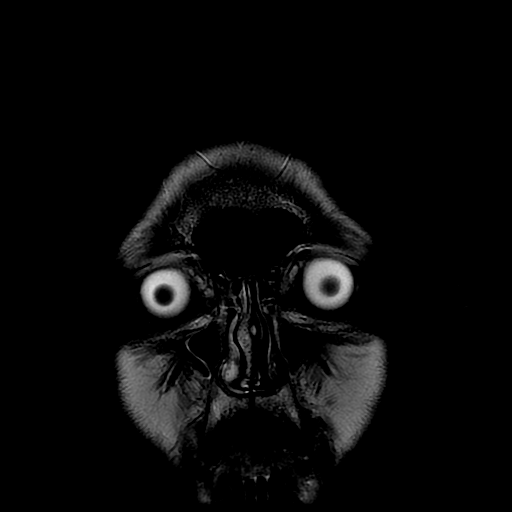
[im 25/25]
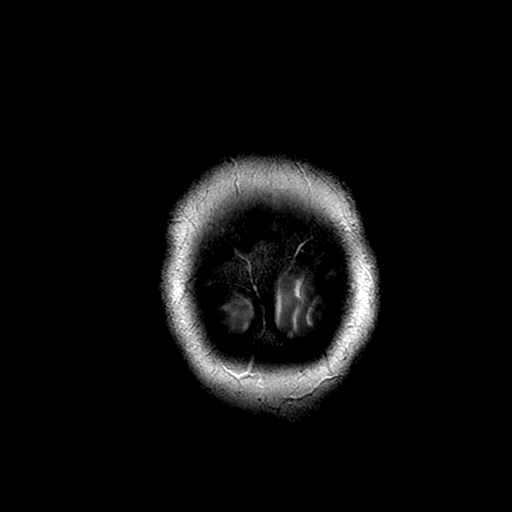

[Series 500: DWI · axial · 3.0mm · 1.09mm/px · z∈[-78,+57]mm · 4 of 46 slices shown (3 of 4)]
[im 1/46]
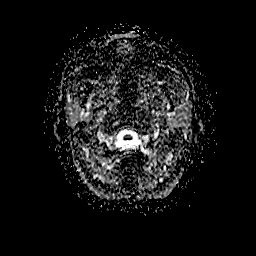
[im 16/46]
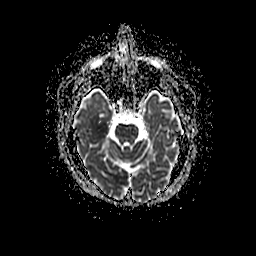
[im 31/46]
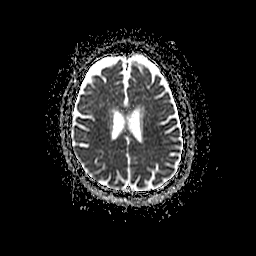
[im 46/46]
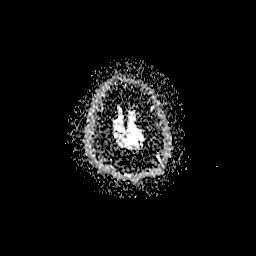

[Series 800: DWI · coronal · 5.0mm · 1.09mm/px · 3 of 33 slices shown (4 of 4)]
[im 1/33]
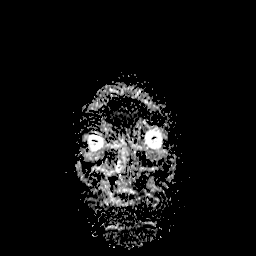
[im 17/33]
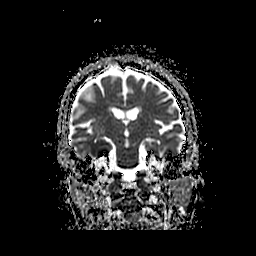
[im 33/33]
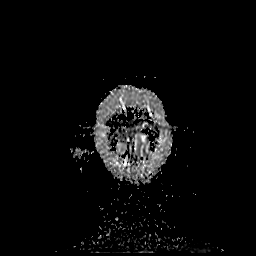

[31 of 48 positions shown; findings below may reference images not displayed]

FINDINGS: MRI HEAD FINDINGS

Brain: Diffuse prominence of the CSF containing spaces is compatible
with generalized age-related cerebral atrophy. Patchy T2/FLAIR
hyperintensity within the periventricular and deep white matter both
cerebral hemispheres most consistent with chronic microvascular
ischemic disease, mild for age.

There is patchy abnormal restricted diffusion involving the mesial
right temporal lobe, extending posteriorly into the right occipital
lobe as well as the right aspect of the splenium. Findings
compatible with acute right PCA territory infarct. Diffusion
abnormality extends across the breadth of the splenium, however,
associated T2/FLAIR signal abnormality appears confined largely to
the right aspect of the splenium. No associated hemorrhage or mass
effect. Patchy involvement within the right thalamus as well.

No other acute infarct. Gray-white matter differentiation otherwise
maintained. No evidence for acute or chronic intracranial
hemorrhage. No other areas of chronic infarction identified.

No mass lesion, midline shift or mass effect. No hydrocephalus. The
no extra-axial fluid collection. Major dural sinuses are grossly
patent.

Pituitary gland normal.

Vascular: Major intracranial vascular flow voids are maintained.

Skull and upper cervical spine: Craniocervical junction normal.
Visualized upper cervical spine demonstrates no acute abnormality.
Bone marrow signal intensity within normal limits. No scalp soft
tissue abnormality.

Sinuses/Orbits: Globes and orbital soft tissues within normal
limits. Mild scattered mucosal thickening within the ethmoidal air
cells and maxillary sinuses. Paranasal sinuses are otherwise clear.
No mastoid effusion. Inner ear structures grossly normal.

MRA HEAD FINDINGS

ANTERIOR CIRCULATION:

Distal cervical segments of the internal carotid arteries are widely
patent with antegrade flow. Petrous, cavernous, and supraclinoid
segments are widely patent. A1 segments widely patent. Left A1
segment is hypoplastic. Anterior communicating artery normal.
Anterior cerebral arteries well opacified. M1 segments patent
without stenosis or occlusion. MCA bifurcations normal. Distal MCA
branches well opacified and symmetric.

POSTERIOR CIRCULATION:

Vertebral arteries patent to the vertebrobasilar junction. Basilar
artery somewhat diminutive but widely patent to its distal aspect.
Superior cerebral arteries patent bilaterally. Fetal type left PCA
supplied via a widely patent left posterior communicating artery. A
small right posterior communicating artery present as well. There is
abrupt occlusion of the right P2 segment proximally.

No aneurysm or vascular malformation.
IMPRESSION: MRI HEAD IMPRESSION:

1. Patchy moderate-sized right PCA territory infarct as above. No
associated hemorrhage or mass effect.
2. Mild chronic microvascular ischemic disease.

MRA HEAD IMPRESSION:

1. Proximal right P2 occlusion.
2. Otherwise widely patent vertebrobasilar system.
3. Patent anterior circulation. No high-grade or correctable
stenosis within the intracranial circulation.

## 2018-03-16 ENCOUNTER — Ambulatory Visit (INDEPENDENT_AMBULATORY_CARE_PROVIDER_SITE_OTHER): Payer: Medicare Other

## 2018-03-16 DIAGNOSIS — I639 Cerebral infarction, unspecified: Secondary | ICD-10-CM

## 2018-03-17 NOTE — Progress Notes (Signed)
[facility] Summary Report / Loop Recorder 

## 2018-03-20 ENCOUNTER — Encounter: Payer: Self-pay | Admitting: Gastroenterology

## 2018-03-27 DIAGNOSIS — Z01419 Encounter for gynecological examination (general) (routine) without abnormal findings: Secondary | ICD-10-CM | POA: Diagnosis not present

## 2018-03-27 DIAGNOSIS — Z6829 Body mass index (BMI) 29.0-29.9, adult: Secondary | ICD-10-CM | POA: Diagnosis not present

## 2018-03-27 DIAGNOSIS — Z1231 Encounter for screening mammogram for malignant neoplasm of breast: Secondary | ICD-10-CM | POA: Diagnosis not present

## 2018-03-27 DIAGNOSIS — Z124 Encounter for screening for malignant neoplasm of cervix: Secondary | ICD-10-CM | POA: Diagnosis not present

## 2018-03-28 ENCOUNTER — Encounter: Payer: Self-pay | Admitting: Family Medicine

## 2018-03-28 ENCOUNTER — Other Ambulatory Visit: Payer: Self-pay

## 2018-03-28 MED ORDER — CYCLOBENZAPRINE HCL 10 MG PO TABS: 10.0000 mg | ORAL_TABLET | Freq: Every day | ORAL | 0 refills | Status: DC

## 2018-03-29 ENCOUNTER — Encounter: Payer: Self-pay | Admitting: Family Medicine

## 2018-03-29 ENCOUNTER — Other Ambulatory Visit: Payer: Self-pay

## 2018-03-29 ENCOUNTER — Ambulatory Visit: Payer: Medicare Other | Admitting: Family Medicine

## 2018-03-29 VITALS — BP 114/78 | HR 97 | Temp 98.0°F | Ht 64.5 in | Wt 183.5 lb

## 2018-03-29 DIAGNOSIS — I10 Essential (primary) hypertension: Secondary | ICD-10-CM | POA: Diagnosis not present

## 2018-03-29 MED ORDER — BUTALBITAL-APAP-CAFFEINE 50-325-40 MG PO TABS: 1.0000 | ORAL_TABLET | Freq: Two times a day (BID) | ORAL | 0 refills | Status: DC | PRN
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
## 2018-04-12 ENCOUNTER — Encounter: Payer: Self-pay | Admitting: Family Medicine

## 2018-04-13 ENCOUNTER — Other Ambulatory Visit: Payer: Self-pay

## 2018-04-13 MED ORDER — LISINOPRIL 5 MG PO TABS: 5.0000 mg | ORAL_TABLET | Freq: Every day | ORAL | 3 refills | Status: DC

## 2018-04-13 MED ORDER — GABAPENTIN 600 MG PO TABS: ORAL_TABLET | ORAL | 3 refills | Status: DC

## 2018-04-13 MED ORDER — PANTOPRAZOLE SODIUM 40 MG PO TBEC: 40.0000 mg | DELAYED_RELEASE_TABLET | Freq: Every day | ORAL | 3 refills | Status: DC

## 2018-04-13 MED ORDER — ROSUVASTATIN CALCIUM 40 MG PO TABS: 40.0000 mg | ORAL_TABLET | Freq: Every day | ORAL | 3 refills | Status: DC

## 2018-04-16 ENCOUNTER — Encounter: Payer: Self-pay | Admitting: Family Medicine

## 2018-04-17 ENCOUNTER — Other Ambulatory Visit: Payer: Self-pay

## 2018-04-17 MED ORDER — ROSUVASTATIN CALCIUM 40 MG PO TABS: 40.0000 mg | ORAL_TABLET | Freq: Every day | ORAL | 3 refills | Status: DC

## 2018-04-17 MED ORDER — LISINOPRIL 5 MG PO TABS: 5.0000 mg | ORAL_TABLET | Freq: Every day | ORAL | 3 refills | Status: DC

## 2018-04-17 MED ORDER — GABAPENTIN 600 MG PO TABS: ORAL_TABLET | ORAL | 3 refills | Status: DC

## 2018-04-17 MED ORDER — PANTOPRAZOLE SODIUM 40 MG PO TBEC: 40.0000 mg | DELAYED_RELEASE_TABLET | Freq: Every day | ORAL | 3 refills | Status: DC

## 2018-04-19 ENCOUNTER — Ambulatory Visit (INDEPENDENT_AMBULATORY_CARE_PROVIDER_SITE_OTHER): Payer: Medicare Other

## 2018-04-19 DIAGNOSIS — I639 Cerebral infarction, unspecified: Secondary | ICD-10-CM | POA: Diagnosis not present

## 2018-04-20 LAB — CUP PACEART REMOTE DEVICE CHECK
Implantable Pulse Generator Implant Date: 20171019
MDC IDC SESS DTM: 20200108064307

## 2018-04-20 NOTE — Progress Notes (Signed)
[facility] Summary Report / Loop Recorder 

## 2018-04-25 ENCOUNTER — Ambulatory Visit (AMBULATORY_SURGERY_CENTER): Payer: Self-pay

## 2018-04-25 ENCOUNTER — Encounter: Payer: Self-pay | Admitting: Gastroenterology

## 2018-04-25 VITALS — Ht 66.0 in | Wt 187.6 lb

## 2018-04-25 DIAGNOSIS — Z1211 Encounter for screening for malignant neoplasm of colon: Secondary | ICD-10-CM

## 2018-04-25 MED ORDER — PEG 3350-KCL-NA BICARB-NACL 420 G PO SOLR: 4000.0000 mL | Freq: Once | ORAL | 0 refills | Status: AC

## 2018-04-25 NOTE — Progress Notes (Signed)
Denies allergies to eggs or soy products. Denies complication of anesthesia or sedation. Denies use of weight loss medication. Denies use of O2.   Emmi instructions declined.   Patient was originally ordered Golytely for her prep. Patient was saying could not drink any prep. I offered her Miralax but she declined. I did explain that we could not do the procedure without doing a prep. Patient was given a choice of which prep she wanted to use and she chose Golytely. I gave her every tip I could think of to help her drink the prep. I instructed her to use a straw and to drink slowly. I also told her she could use hard candy. Patient states that she will try her best to get the prep down. Patient was encouraged to call if she needed anything.

## 2018-04-30 LAB — CUP PACEART REMOTE DEVICE CHECK
Date Time Interrogation Session: 20191206044028
Implantable Pulse Generator Implant Date: 20171019

## 2018-05-09 ENCOUNTER — Encounter: Payer: Self-pay | Admitting: Gastroenterology

## 2018-05-09 ENCOUNTER — Ambulatory Visit (AMBULATORY_SURGERY_CENTER): Payer: Medicare Other | Admitting: Gastroenterology

## 2018-05-09 VITALS — BP 140/86 | HR 72 | Temp 98.0°F | Resp 13 | Ht 66.0 in | Wt 187.0 lb

## 2018-05-09 DIAGNOSIS — D128 Benign neoplasm of rectum: Secondary | ICD-10-CM

## 2018-05-09 DIAGNOSIS — Z1211 Encounter for screening for malignant neoplasm of colon: Secondary | ICD-10-CM

## 2018-05-09 DIAGNOSIS — K621 Rectal polyp: Secondary | ICD-10-CM | POA: Diagnosis not present

## 2018-05-09 MED ORDER — SODIUM CHLORIDE 0.9 % IV SOLN: 500.0000 mL | Freq: Once | INTRAVENOUS | Status: DC

## 2018-05-09 NOTE — Op Note (Signed)
[place] Patient Name: Andrea [name] Procedure Date: 05/09/2018 10:05 AM MRN: [id number] Endoscopist: Justice Britain , MD Age: 68 Referring MD:  Date of Birth: Aug 13, 1950 Gender: Female Account #: [id number] Procedure:                Colonoscopy Indications:              Screening for colorectal malignant neoplasm Medicines:                Monitored Anesthesia Care Procedure:                Pre-Anesthesia Assessment:                           - Prior to the procedure, a History and Physical                            was performed, and patient medications and                            allergies were reviewed. The patient's tolerance of                            previous anesthesia was also reviewed. The risks                            and benefits of the procedure and the sedation                            options and risks were discussed with the patient.                            All questions were answered, and informed consent                            was obtained. Prior Anticoagulants: The patient has                            taken aspirin. ASA Grade Assessment: II - A patient                            with mild systemic disease. After reviewing the                            risks and benefits, the patient was deemed in                            satisfactory condition to undergo the procedure.                           After obtaining informed consent, the colonoscope                            was passed under direct vision. Throughout the  procedure, the patient's blood pressure, pulse, and                            oxygen saturations were monitored continuously. The                            Colonoscope was introduced through the anus and                            advanced to the 5 cm into the ileum. The                            colonoscopy was technically difficult and complex                            due to  restricted mobility of the colon,                            significant looping and a tortuous colon.                            Successful completion of the procedure was aided by                            changing the patient's position, using manual                            pressure, withdrawing and reinserting the scope,                            straightening and shortening the scope to obtain                            bowel loop reduction and using scope torsion. The                            patient tolerated the procedure. The quality of the                            bowel preparation was evaluated using the BBPS                            [facility] Bowel Preparation Scale) with scores of:                            Right Colon = 2 (minor amount of residual staining,                            small fragments of stool and/or opaque liquid, but                            mucosa seen well), Transverse Colon = 3 (entire  mucosa seen well with no residual staining, small                            fragments of stool or opaque liquid) and Left Colon                            = 3 (entire mucosa seen well with no residual                            staining, small fragments of stool or opaque                            liquid). The total BBPS score equals 8. The quality                            of the bowel preparation was good. Scope In: 10:14:00 AM Scope Out: 10:35:34 AM Scope Withdrawal Time: 0 hours 10 minutes 45 seconds  Total Procedure Duration: 0 hours 21 minutes 34 seconds  Findings:                 The digital rectal exam findings include                            non-thrombosed external hemorrhoids and                            non-thrombosed internal hemorrhoids. Pertinent                            negatives include no palpable rectal lesions.                           The terminal ileum and ileocecal valve appeared                             normal.                           Multiple small-mouthed diverticula were found in                            the recto-sigmoid colon and sigmoid colon.                           Multiple sessile polyps were found in the rectum.                            The polyps were 1 to 2 mm in size. They have the                            endoscopic appearance of hyperplastic polyps. 5 of                            these polyps were removed with a cold biopsy  forceps. Resection and retrieval were complete.                           Normal mucosa was found in the entire colon.                           Non-bleeding non-thrombosed external and internal                            hemorrhoids were found during retroflexion, during                            perianal exam and during digital exam. The                            hemorrhoids were Grade II (internal hemorrhoids                            that prolapse but reduce spontaneously). Complications:            No immediate complications. Estimated Blood Loss:     Estimated blood loss was minimal. Impression:               - Non-thrombosed external hemorrhoids and                            non-thrombosed internal hemorrhoids found on                            digital rectal exam.                           - The examined portion of the ileum was normal.                           - Diverticulosis in the recto-sigmoid colon and in                            the sigmoid colon.                           - Multiple 1 to 2 mm polyps in the rectum, 5                            removed with a cold biopsy forceps. Resected and                            retrieved.                           - Normal mucosa in the entire examined colon.                           - Non-bleeding non-thrombosed external and internal  hemorrhoids. Recommendation:           - The patient will be observed post-procedure,                             until all discharge criteria are met.                           - Discharge patient to home.                           - Patient has a contact number available for                            emergencies. The signs and symptoms of potential                            delayed complications were discussed with the                            patient. Return to normal activities tomorrow.                            Written discharge instructions were provided to the                            patient.                           - High fiber diet.                           - Use fiber, for example Citrucel, Fibercon, Konsyl                            or Metamucil once daily.                           - Continue present medications.                           - Await pathology results in 10-years if no                            adenomatous tissue is found.                           - The findings and recommendations were discussed                            with the patient.                           - The findings and recommendations were discussed                            with the patient's family.                           -  Repeat colonoscopy for surveillance based on                            pathology results. Justice Britain, MD 05/09/2018 10:45:28 AM

## 2018-05-09 NOTE — Patient Instructions (Addendum)
YOU HAD AN ENDOSCOPIC PROCEDURE TODAY AT [facility]:   Refer to the procedure report that was given to you for any specific questions about what was found during the examination.  If the procedure report does not answer your questions, please call your gastroenterologist to clarify.  If you requested that your care partner not be given the details of your procedure findings, then the procedure report has been included in a sealed envelope for you to review at your convenience later.  YOU SHOULD EXPECT: Some feelings of bloating in the abdomen. Passage of more gas than usual.  Walking can help get rid of the air that was put into your GI tract during the procedure and reduce the bloating. If you had a lower endoscopy (such as a colonoscopy or flexible sigmoidoscopy) you may notice spotting of blood in your stool or on the toilet paper. If you underwent a bowel prep for your procedure, you may not have a normal bowel movement for a few days.  Please Note:  You might notice some irritation and congestion in your nose or some drainage.  This is from the oxygen used during your procedure.  There is no need for concern and it should clear up in a day or so.  SYMPTOMS TO REPORT IMMEDIATELY:   Following lower endoscopy (colonoscopy or flexible sigmoidoscopy):  Excessive amounts of blood in the stool  Significant tenderness or worsening of abdominal pains  Swelling of the abdomen that is new, acute  Fever of 100F or higher   Following upper endoscopy (EGD)  Vomiting of blood or coffee ground material  New chest pain or pain under the shoulder blades  Painful or persistently difficult swallowing  New shortness of breath  Fever of 100F or higher  Black, tarry-looking stools  For urgent or emergent issues, a gastroenterologist can be reached at any hour by calling [phone].   DIET:  We do recommend a small meal at first, but then you may proceed to your regular diet.  Drink  plenty of fluids but you should avoid alcoholic beverages for 24 hours.  ACTIVITY:  You should plan to take it easy for the rest of today and you should NOT DRIVE or use heavy machinery until tomorrow (because of the sedation medicines used during the test).    FOLLOW UP: Our staff will call the number listed on your records the next business day following your procedure to check on you and address any questions or concerns that you may have regarding the information given to you following your procedure. If we do not reach you, we will leave a message.  However, if you are feeling well and you are not experiencing any problems, there is no need to return our call.  We will assume that you have returned to your regular daily activities without incident.  If any biopsies were taken you will be contacted by phone or by letter within the next 1-3 weeks.  Please call us at [phone] if you have not heard about the biopsies in 3 weeks.    SIGNATURES/CONFIDENTIALITY: You and/or your care partner have signed paperwork which will be entered into your electronic medical record.  These signatures attest to the fact that that the information above on your After Visit Summary has been reviewed and is understood.  Full responsibility of the confidentiality of this discharge information lies with you and/or your care-partner.  Polyp, diverticulosis, high fiber diet, and hemorrhoid information given. Fiber supplement  daily, fibercon, Konsyl, Metamucil,  Or citrucel

## 2018-05-09 NOTE — Progress Notes (Signed)
Spontaneous respirations throughout. VSS. Resting comfortably. To PACU on room air. Report to  RN. 

## 2018-05-09 NOTE — Progress Notes (Signed)
Called to room to assist during endoscopic procedure.  Patient ID and intended procedure confirmed with present staff. Received instructions for my participation in the procedure from the performing physician.  

## 2018-05-09 NOTE — Progress Notes (Signed)
Pt's states no medical or surgical changes since previsit or office visit. 

## 2018-05-10 ENCOUNTER — Telehealth: Payer: Self-pay | Admitting: *Deleted

## 2018-05-10 NOTE — Telephone Encounter (Signed)
  Follow up Call-  Call back number 05/09/2018  Post procedure Call Back phone  # [phone]  Permission to leave phone message Yes  Some recent data might be hidden     Patient questions:  Do you have a fever, pain , or abdominal swelling? No. Pain Score  0 *  Have you tolerated food without any problems? Yes.    Have you been able to return to your normal activities? Yes.    Do you have any questions about your discharge instructions: Diet   No. Medications  No. Follow up visit  No.  Do you have questions or concerns about your Care? No.  Actions: * If pain score is 4 or above: No action needed, pain <4.

## 2018-05-12 ENCOUNTER — Encounter: Payer: Self-pay | Admitting: Gastroenterology

## 2018-05-19 ENCOUNTER — Other Ambulatory Visit: Payer: Self-pay

## 2018-05-19 ENCOUNTER — Encounter: Payer: Self-pay | Admitting: Family Medicine

## 2018-05-19 MED ORDER — BUTALBITAL-APAP-CAFFEINE 50-325-40 MG PO TABS: 1.0000 | ORAL_TABLET | Freq: Two times a day (BID) | ORAL | 0 refills | Status: DC | PRN

## 2018-05-22 ENCOUNTER — Ambulatory Visit (INDEPENDENT_AMBULATORY_CARE_PROVIDER_SITE_OTHER): Payer: Medicare Other

## 2018-05-22 DIAGNOSIS — I639 Cerebral infarction, unspecified: Secondary | ICD-10-CM | POA: Diagnosis not present

## 2018-05-22 LAB — CUP PACEART REMOTE DEVICE CHECK
Date Time Interrogation Session: 20200210104121
Implantable Pulse Generator Implant Date: 20171019

## 2018-05-28 ENCOUNTER — Encounter: Payer: Self-pay | Admitting: Family Medicine

## 2018-06-01 NOTE — Progress Notes (Signed)
[facility] Summary Report / Loop Recorder 

## 2018-06-24 LAB — CUP PACEART REMOTE DEVICE CHECK
Date Time Interrogation Session: 20200314103738
MDC IDC PG IMPLANT DT: 20171019

## 2018-06-26 ENCOUNTER — Other Ambulatory Visit: Payer: Self-pay

## 2018-06-26 ENCOUNTER — Ambulatory Visit (INDEPENDENT_AMBULATORY_CARE_PROVIDER_SITE_OTHER): Payer: Medicare Other | Admitting: *Deleted

## 2018-06-26 DIAGNOSIS — I639 Cerebral infarction, unspecified: Secondary | ICD-10-CM

## 2018-06-27 ENCOUNTER — Encounter: Payer: Self-pay | Admitting: Family Medicine

## 2018-06-29 MED ORDER — CYCLOBENZAPRINE HCL 10 MG PO TABS: 10.0000 mg | ORAL_TABLET | Freq: Every day | ORAL | 0 refills | Status: DC

## 2018-07-03 NOTE — Progress Notes (Signed)
[facility] Summary Report / Loop Recorder 

## 2018-07-27 ENCOUNTER — Other Ambulatory Visit: Payer: Self-pay

## 2018-07-27 ENCOUNTER — Ambulatory Visit (INDEPENDENT_AMBULATORY_CARE_PROVIDER_SITE_OTHER): Payer: Medicare Other | Admitting: *Deleted

## 2018-07-27 DIAGNOSIS — I639 Cerebral infarction, unspecified: Secondary | ICD-10-CM

## 2018-07-27 LAB — CUP PACEART REMOTE DEVICE CHECK
Date Time Interrogation Session: 20200416113852
Implantable Pulse Generator Implant Date: 20171019

## 2018-08-02 NOTE — Progress Notes (Signed)
[facility] Summary Report / Loop Recorder 

## 2018-08-29 ENCOUNTER — Other Ambulatory Visit: Payer: Self-pay

## 2018-08-29 ENCOUNTER — Ambulatory Visit (INDEPENDENT_AMBULATORY_CARE_PROVIDER_SITE_OTHER): Payer: Medicare Other | Admitting: *Deleted

## 2018-08-29 DIAGNOSIS — I639 Cerebral infarction, unspecified: Secondary | ICD-10-CM

## 2018-08-30 LAB — CUP PACEART REMOTE DEVICE CHECK
Date Time Interrogation Session: 20200519141108
Implantable Pulse Generator Implant Date: 20171019

## 2018-09-07 NOTE — Progress Notes (Signed)
[facility] Summary Report / Loop Recorder 

## 2018-09-18 ENCOUNTER — Encounter: Payer: Self-pay | Admitting: Family Medicine

## 2018-09-19 NOTE — Telephone Encounter (Signed)
Patient is due for a follow up, this has been scheduled. Rx will be filled at her appointment.

## 2018-09-22 ENCOUNTER — Other Ambulatory Visit: Payer: Self-pay

## 2018-09-22 ENCOUNTER — Ambulatory Visit (INDEPENDENT_AMBULATORY_CARE_PROVIDER_SITE_OTHER): Payer: Medicare Other | Admitting: Family Medicine

## 2018-09-22 VITALS — BP 120/70 | HR 91 | Temp 97.9°F | Wt 188.5 lb

## 2018-09-22 DIAGNOSIS — M5416 Radiculopathy, lumbar region: Secondary | ICD-10-CM

## 2018-09-22 DIAGNOSIS — R519 Headache, unspecified: Secondary | ICD-10-CM

## 2018-09-22 DIAGNOSIS — R51 Headache: Secondary | ICD-10-CM | POA: Diagnosis not present

## 2018-09-22 DIAGNOSIS — G8929 Other chronic pain: Secondary | ICD-10-CM

## 2018-09-22 MED ORDER — BUTALBITAL-APAP-CAFFEINE 50-325-40 MG PO TABS: 1.0000 | ORAL_TABLET | Freq: Four times a day (QID) | ORAL | 0 refills | Status: DC | PRN

## 2018-09-22 MED ORDER — CYCLOBENZAPRINE HCL 10 MG PO TABS: 10.0000 mg | ORAL_TABLET | Freq: Every day | ORAL | 1 refills | Status: DC
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
## 2018-10-02 ENCOUNTER — Ambulatory Visit (INDEPENDENT_AMBULATORY_CARE_PROVIDER_SITE_OTHER): Payer: Medicare Other | Admitting: *Deleted

## 2018-10-02 DIAGNOSIS — I639 Cerebral infarction, unspecified: Secondary | ICD-10-CM

## 2018-10-02 LAB — CUP PACEART REMOTE DEVICE CHECK
Date Time Interrogation Session: 20200621140909
Implantable Pulse Generator Implant Date: 20171019

## 2018-10-09 NOTE — Progress Notes (Signed)
[facility] Summary Report / Loop Recorder 

## 2018-10-16 ENCOUNTER — Ambulatory Visit: Payer: Medicare Other

## 2018-11-03 ENCOUNTER — Ambulatory Visit (INDEPENDENT_AMBULATORY_CARE_PROVIDER_SITE_OTHER): Payer: Medicare Other | Admitting: *Deleted

## 2018-11-03 DIAGNOSIS — I639 Cerebral infarction, unspecified: Secondary | ICD-10-CM

## 2018-11-03 LAB — CUP PACEART REMOTE DEVICE CHECK
Date Time Interrogation Session: 20200724145002
Implantable Pulse Generator Implant Date: 20171019

## 2018-11-09 NOTE — Progress Notes (Signed)
[facility] Summary Report / Loop Recorder 

## 2018-12-06 ENCOUNTER — Ambulatory Visit (INDEPENDENT_AMBULATORY_CARE_PROVIDER_SITE_OTHER): Payer: Medicare Other | Admitting: *Deleted

## 2018-12-06 DIAGNOSIS — I639 Cerebral infarction, unspecified: Secondary | ICD-10-CM

## 2018-12-06 DIAGNOSIS — I6389 Other cerebral infarction: Secondary | ICD-10-CM | POA: Diagnosis not present

## 2018-12-06 LAB — CUP PACEART REMOTE DEVICE CHECK
Date Time Interrogation Session: 20200826173737
Implantable Pulse Generator Implant Date: 20171019

## 2018-12-14 ENCOUNTER — Encounter: Payer: Self-pay | Admitting: Family Medicine

## 2018-12-14 NOTE — Progress Notes (Signed)
[facility] Summary Report / Loop Recorder 

## 2018-12-18 MED ORDER — BUTALBITAL-APAP-CAFFEINE 50-325-40 MG PO TABS: 1.0000 | ORAL_TABLET | Freq: Four times a day (QID) | ORAL | 0 refills | Status: DC | PRN

## 2018-12-19 ENCOUNTER — Encounter: Payer: Self-pay | Admitting: Family Medicine

## 2019-01-08 ENCOUNTER — Ambulatory Visit (INDEPENDENT_AMBULATORY_CARE_PROVIDER_SITE_OTHER): Payer: Medicare Other | Admitting: *Deleted

## 2019-01-08 DIAGNOSIS — I639 Cerebral infarction, unspecified: Secondary | ICD-10-CM | POA: Diagnosis not present

## 2019-01-09 LAB — CUP PACEART REMOTE DEVICE CHECK
Date Time Interrogation Session: 20200929172400
Implantable Pulse Generator Implant Date: 20171019

## 2019-01-17 NOTE — Progress Notes (Signed)
[facility] Summary Report / Loop Recorder 

## 2019-01-25 ENCOUNTER — Ambulatory Visit (INDEPENDENT_AMBULATORY_CARE_PROVIDER_SITE_OTHER): Payer: Medicare Other

## 2019-01-25 ENCOUNTER — Other Ambulatory Visit: Payer: Self-pay

## 2019-01-25 DIAGNOSIS — Z23 Encounter for immunization: Secondary | ICD-10-CM

## 2019-02-11 LAB — CUP PACEART REMOTE DEVICE CHECK
Date Time Interrogation Session: 20201101172611
Implantable Pulse Generator Implant Date: 20171019

## 2019-02-12 ENCOUNTER — Other Ambulatory Visit: Payer: Self-pay

## 2019-02-12 ENCOUNTER — Encounter: Payer: Self-pay | Admitting: Family Medicine

## 2019-02-12 ENCOUNTER — Ambulatory Visit (INDEPENDENT_AMBULATORY_CARE_PROVIDER_SITE_OTHER): Payer: Medicare Other | Admitting: *Deleted

## 2019-02-12 DIAGNOSIS — I639 Cerebral infarction, unspecified: Secondary | ICD-10-CM | POA: Diagnosis not present

## 2019-02-12 MED ORDER — GABAPENTIN 600 MG PO TABS: ORAL_TABLET | ORAL | 0 refills | Status: DC

## 2019-03-06 NOTE — Progress Notes (Signed)
[facility] Summary Report / Loop Recorder 

## 2019-03-16 ENCOUNTER — Ambulatory Visit (INDEPENDENT_AMBULATORY_CARE_PROVIDER_SITE_OTHER): Payer: Medicare Other | Admitting: *Deleted

## 2019-03-16 DIAGNOSIS — I639 Cerebral infarction, unspecified: Secondary | ICD-10-CM

## 2019-03-17 LAB — CUP PACEART REMOTE DEVICE CHECK
Date Time Interrogation Session: 20201204122631
Implantable Pulse Generator Implant Date: 20171019

## 2019-03-19 ENCOUNTER — Encounter: Payer: Self-pay | Admitting: Family Medicine

## 2019-03-19 ENCOUNTER — Other Ambulatory Visit: Payer: Self-pay

## 2019-03-19 ENCOUNTER — Ambulatory Visit (INDEPENDENT_AMBULATORY_CARE_PROVIDER_SITE_OTHER): Payer: Medicare Other | Admitting: Family Medicine

## 2019-03-19 VITALS — BP 122/76 | HR 91 | Temp 98.0°F | Ht 65.0 in | Wt 188.0 lb

## 2019-03-19 DIAGNOSIS — Z0000 Encounter for general adult medical examination without abnormal findings: Secondary | ICD-10-CM | POA: Diagnosis not present

## 2019-03-19 LAB — CBC WITH DIFFERENTIAL/PLATELET
Basophils Absolute: 0.1 10*3/uL (ref 0.0–0.1)
Basophils Relative: 0.7 % (ref 0.0–3.0)
Eosinophils Absolute: 0.2 10*3/uL (ref 0.0–0.7)
Eosinophils Relative: 1.9 % (ref 0.0–5.0)
HCT: 40.3 % (ref 36.0–46.0)
Hemoglobin: 13.6 g/dL (ref 12.0–15.0)
Lymphocytes Relative: 22.3 % (ref 12.0–46.0)
Lymphs Abs: 1.9 10*3/uL (ref 0.7–4.0)
MCHC: 33.7 g/dL (ref 30.0–36.0)
MCV: 86.9 fl (ref 78.0–100.0)
Monocytes Absolute: 0.6 10*3/uL (ref 0.1–1.0)
Monocytes Relative: 6.8 % (ref 3.0–12.0)
Neutro Abs: 5.9 10*3/uL (ref 1.4–7.7)
Neutrophils Relative %: 68.3 % (ref 43.0–77.0)
Platelets: 234 10*3/uL (ref 150.0–400.0)
RBC: 4.63 Mil/uL (ref 3.87–5.11)
RDW: 12.7 % (ref 11.5–15.5)
WBC: 8.7 10*3/uL (ref 4.0–10.5)

## 2019-03-19 LAB — HEPATIC FUNCTION PANEL
ALT: 18 U/L (ref 0–35)
AST: 21 U/L (ref 0–37)
Albumin: 4.4 g/dL (ref 3.5–5.2)
Alkaline Phosphatase: 102 U/L (ref 39–117)
Bilirubin, Direct: 0.1 mg/dL (ref 0.0–0.3)
Total Bilirubin: 0.6 mg/dL (ref 0.2–1.2)
Total Protein: 6.2 g/dL (ref 6.0–8.3)

## 2019-03-19 LAB — BASIC METABOLIC PANEL
BUN: 12 mg/dL (ref 6–23)
CO2: 31 mEq/L (ref 19–32)
Calcium: 9.6 mg/dL (ref 8.4–10.5)
Chloride: 105 mEq/L (ref 96–112)
Creatinine, Ser: 1.12 mg/dL (ref 0.40–1.20)
GFR: 48.44 mL/min — ABNORMAL LOW (ref >60.00)
Glucose, Bld: 103 mg/dL — ABNORMAL HIGH (ref 70–99)
Potassium: 4.1 mEq/L (ref 3.5–5.1)
Sodium: 143 mEq/L (ref 135–145)

## 2019-03-19 LAB — LIPID PANEL
Cholesterol: 129 mg/dL (ref 0–200)
HDL: 50 mg/dL (ref >39.00)
LDL Cholesterol: 51 mg/dL (ref 0–99)
NonHDL: 78.75
Total CHOL/HDL Ratio: 3
Triglycerides: 137 mg/dL (ref 0.0–149.0)
VLDL: 27.4 mg/dL (ref 0.0–40.0)

## 2019-03-19 LAB — TSH: TSH: 1.08 u[IU]/mL (ref 0.35–4.50)

## 2019-03-19 MED ORDER — BUTALBITAL-APAP-CAFFEINE 50-325-40 MG PO TABS: 1.0000 | ORAL_TABLET | Freq: Four times a day (QID) | ORAL | 0 refills | Status: DC | PRN
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
## 2019-03-19 NOTE — Patient Instructions (Signed)
Preventive Care 38 Years and Older, Female Preventive care refers to lifestyle choices and visits with your health care provider that can promote health and wellness. This includes:  A yearly physical exam. This is also called an annual well check.  Regular dental and eye exams.  Immunizations.  Screening for certain conditions.  Healthy lifestyle choices, such as diet and exercise. What can I expect for my preventive care visit? Physical exam Your health care provider will check:  Height and weight. These may be used to calculate body mass index (BMI), which is a measurement that tells if you are at a healthy weight.  Heart rate and blood pressure.  Your skin for abnormal spots. Counseling Your health care provider may ask you questions about:  Alcohol, tobacco, and drug use.  Emotional well-being.  Home and relationship well-being.  Sexual activity.  Eating habits.  History of falls.  Memory and ability to understand (cognition).  Work and work Statistician.  Pregnancy and menstrual history. What immunizations do I need?  Influenza (flu) vaccine  This is recommended every year. Tetanus, diphtheria, and pertussis (Tdap) vaccine  You may need a Td booster every 10 years. Varicella (chickenpox) vaccine  You may need this vaccine if you have not already been vaccinated. Zoster (shingles) vaccine  You may need this after age 67. Pneumococcal conjugate (PCV13) vaccine  One dose is recommended after age 67. Pneumococcal polysaccharide (PPSV23) vaccine  One dose is recommended after age 72. Measles, mumps, and rubella (MMR) vaccine  You may need at least one dose of MMR if you were born in 1957 or later. You may also need a second dose. Meningococcal conjugate (MenACWY) vaccine  You may need this if you have certain conditions. Hepatitis A vaccine  You may need this if you have certain conditions or if you travel or work in places where you may be exposed  to hepatitis A. Hepatitis B vaccine  You may need this if you have certain conditions or if you travel or work in places where you may be exposed to hepatitis B. Haemophilus influenzae type b (Hib) vaccine  You may need this if you have certain conditions. You may receive vaccines as individual doses or as more than one vaccine together in one shot (combination vaccines). Talk with your health care provider about the risks and benefits of combination vaccines. What tests do I need? Blood tests  Lipid and cholesterol levels. These may be checked every 5 years, or more frequently depending on your overall health.  Hepatitis C test.  Hepatitis B test. Screening  Lung cancer screening. You may have this screening every year starting at age 67 if you have a 30-pack-year history of smoking and currently smoke or have quit within the past 15 years.  Colorectal cancer screening. All adults should have this screening starting at age 36 and continuing until age 15. Your health care provider may recommend screening at age 23 if you are at increased risk. You will have tests every 1-10 years, depending on your results and the type of screening test.  Diabetes screening. This is done by checking your blood sugar (glucose) after you have not eaten for a while (fasting). You may have this done every 1-3 years.  Mammogram. This may be done every 1-2 years. Talk with your health care provider about how often you should have regular mammograms.  BRCA-related cancer screening. This may be done if you have a family history of breast, ovarian, tubal, or peritoneal cancers.  Other tests  Sexually transmitted disease (STD) testing.  Bone density scan. This is done to screen for osteoporosis. You may have this done starting at age 76. Follow these instructions at home: Eating and drinking  Eat a diet that includes fresh fruits and vegetables, whole grains, lean protein, and low-fat dairy products. Limit  your intake of foods with high amounts of sugar, saturated fats, and salt.  Take vitamin and mineral supplements as recommended by your health care provider.  Do not drink alcohol if your health care provider tells you not to drink.  If you drink alcohol: ? Limit how much you have to 0-1 drink a day. ? Be aware of how much alcohol is in your drink. In the [place] one drink equals one 12 oz bottle of beer (355 mL), one 5 oz glass of wine (148 mL), or one 1 oz glass of hard liquor (44 mL). Lifestyle  Take daily care of your teeth and gums.  Stay active. Exercise for at least 30 minutes on 5 or more days each week.  Do not use any products that contain nicotine or tobacco, such as cigarettes, e-cigarettes, and chewing tobacco. If you need help quitting, ask your health care provider.  If you are sexually active, practice safe sex. Use a condom or other form of protection in order to prevent STIs (sexually transmitted infections).  Talk with your health care provider about taking a low-dose aspirin or statin. What's next?  Go to your health care provider once a year for a well check visit.  Ask your health care provider how often you should have your eyes and teeth checked.  Stay up to date on all vaccines. This information is not intended to replace advice given to you by your health care provider. Make sure you discuss any questions you have with your health care provider. Document Released: 04/25/2015 Document Revised: 03/23/2018 Document Reviewed: 03/23/2018 Elsevier Patient Education  2020 [employer].

## 2019-03-26 ENCOUNTER — Encounter: Payer: Self-pay | Admitting: Family Medicine

## 2019-03-26 ENCOUNTER — Other Ambulatory Visit: Payer: Self-pay

## 2019-03-26 MED ORDER — CYCLOBENZAPRINE HCL 10 MG PO TABS: 10.0000 mg | ORAL_TABLET | Freq: Every day | ORAL | 0 refills | Status: DC

## 2019-04-11 ENCOUNTER — Telehealth: Payer: Self-pay

## 2019-04-11 NOTE — Telephone Encounter (Signed)
Received form from [employer] [place] and faxed completed form to them at [phone]. Cover My Meds Key # [id number]

## 2019-04-17 ENCOUNTER — Encounter: Payer: Self-pay | Admitting: Family Medicine

## 2019-04-17 ENCOUNTER — Other Ambulatory Visit: Payer: Self-pay

## 2019-04-17 MED ORDER — GABAPENTIN 600 MG PO TABS: ORAL_TABLET | ORAL | 1 refills | Status: DC

## 2019-04-17 MED ORDER — LISINOPRIL 5 MG PO TABS: 5.0000 mg | ORAL_TABLET | Freq: Every day | ORAL | 3 refills | Status: DC

## 2019-04-17 MED ORDER — ROSUVASTATIN CALCIUM 40 MG PO TABS: 40.0000 mg | ORAL_TABLET | Freq: Every day | ORAL | 3 refills | Status: DC

## 2019-04-18 ENCOUNTER — Ambulatory Visit (INDEPENDENT_AMBULATORY_CARE_PROVIDER_SITE_OTHER): Payer: Medicare Other | Admitting: *Deleted

## 2019-04-18 ENCOUNTER — Encounter: Payer: Self-pay | Admitting: Family Medicine

## 2019-04-18 DIAGNOSIS — I639 Cerebral infarction, unspecified: Secondary | ICD-10-CM | POA: Diagnosis not present

## 2019-04-18 DIAGNOSIS — H53462 Homonymous bilateral field defects, left side: Secondary | ICD-10-CM | POA: Diagnosis not present

## 2019-04-19 LAB — CUP PACEART REMOTE DEVICE CHECK
Date Time Interrogation Session: 20210106131204
Implantable Pulse Generator Implant Date: 20171019

## 2019-05-02 DIAGNOSIS — Z124 Encounter for screening for malignant neoplasm of cervix: Secondary | ICD-10-CM | POA: Diagnosis not present

## 2019-05-02 DIAGNOSIS — Z01419 Encounter for gynecological examination (general) (routine) without abnormal findings: Secondary | ICD-10-CM | POA: Diagnosis not present

## 2019-05-02 DIAGNOSIS — I639 Cerebral infarction, unspecified: Secondary | ICD-10-CM | POA: Insufficient documentation

## 2019-05-02 DIAGNOSIS — Z1231 Encounter for screening mammogram for malignant neoplasm of breast: Secondary | ICD-10-CM | POA: Diagnosis not present

## 2019-05-02 DIAGNOSIS — M8588 Other specified disorders of bone density and structure, other site: Secondary | ICD-10-CM | POA: Diagnosis not present

## 2019-05-02 DIAGNOSIS — M8580 Other specified disorders of bone density and structure, unspecified site: Secondary | ICD-10-CM | POA: Insufficient documentation

## 2019-05-02 DIAGNOSIS — Z6830 Body mass index (BMI) 30.0-30.9, adult: Secondary | ICD-10-CM | POA: Diagnosis not present

## 2019-05-02 DIAGNOSIS — G8929 Other chronic pain: Secondary | ICD-10-CM | POA: Insufficient documentation

## 2019-05-07 ENCOUNTER — Other Ambulatory Visit: Payer: Self-pay

## 2019-05-07 ENCOUNTER — Encounter: Payer: Self-pay | Admitting: Family Medicine

## 2019-05-07 MED ORDER — PANTOPRAZOLE SODIUM 40 MG PO TBEC: 40.0000 mg | DELAYED_RELEASE_TABLET | Freq: Every day | ORAL | 3 refills | Status: DC

## 2019-05-09 ENCOUNTER — Encounter: Payer: Self-pay | Admitting: Family Medicine

## 2019-05-16 ENCOUNTER — Encounter: Payer: Self-pay | Admitting: Family Medicine

## 2019-05-21 ENCOUNTER — Ambulatory Visit (INDEPENDENT_AMBULATORY_CARE_PROVIDER_SITE_OTHER): Payer: Medicare Other | Admitting: *Deleted

## 2019-05-21 DIAGNOSIS — I639 Cerebral infarction, unspecified: Secondary | ICD-10-CM | POA: Diagnosis not present

## 2019-05-21 LAB — CUP PACEART REMOTE DEVICE CHECK
Date Time Interrogation Session: 20210207231522
Implantable Pulse Generator Implant Date: 20171019

## 2019-05-21 NOTE — Progress Notes (Signed)
ILR Remote 

## 2019-05-24 ENCOUNTER — Encounter: Payer: Self-pay | Admitting: Family Medicine

## 2019-05-27 MED ORDER — BUTALBITAL-APAP-CAFFEINE 50-325-40 MG PO TABS: 1.0000 | ORAL_TABLET | Freq: Four times a day (QID) | ORAL | 0 refills | Status: DC | PRN

## 2019-06-14 ENCOUNTER — Encounter: Payer: Self-pay | Admitting: Family Medicine

## 2019-06-14 MED ORDER — CYCLOBENZAPRINE HCL 10 MG PO TABS: 10.0000 mg | ORAL_TABLET | Freq: Every day | ORAL | 1 refills | Status: DC

## 2019-06-16 ENCOUNTER — Ambulatory Visit: Payer: Medicare Other | Attending: Internal Medicine

## 2019-06-16 DIAGNOSIS — Z23 Encounter for immunization: Secondary | ICD-10-CM | POA: Insufficient documentation

## 2019-06-16 NOTE — Progress Notes (Signed)
   Covid-19 Vaccination Clinic  Name:  Andrea [name]    MRN: [id number] DOB: [birth year]  06/16/2019  [name] was observed post Covid-19 immunization for 15 minutes without incident. She was provided with Vaccine Information Sheet and instruction to access the V-Safe system.   [name] was instructed to call 911 with any severe reactions post vaccine: Marland Kitchen Difficulty breathing  . Swelling of face and throat  . A fast heartbeat  . A bad rash all over body  . Dizziness and weakness   Immunizations Administered    Name Date Dose VIS Date Route   Pfizer COVID-19 Vaccine 06/16/2019  4:21 PM 0.3 mL 03/23/2019 Intramuscular   Manufacturer: [place]   Lot: [id number]   [place]: [id number]

## 2019-06-21 ENCOUNTER — Ambulatory Visit (INDEPENDENT_AMBULATORY_CARE_PROVIDER_SITE_OTHER): Payer: Medicare Other | Admitting: *Deleted

## 2019-06-21 DIAGNOSIS — I639 Cerebral infarction, unspecified: Secondary | ICD-10-CM

## 2019-06-21 LAB — CUP PACEART REMOTE DEVICE CHECK
Date Time Interrogation Session: 20210311045723
Implantable Pulse Generator Implant Date: 20171019

## 2019-06-21 NOTE — Progress Notes (Signed)
ILR Remote 

## 2019-07-17 ENCOUNTER — Ambulatory Visit: Payer: Medicare Other | Attending: Internal Medicine

## 2019-07-17 DIAGNOSIS — Z23 Encounter for immunization: Secondary | ICD-10-CM

## 2019-07-17 NOTE — Progress Notes (Signed)
   Covid-19 Vaccination Clinic  Name:  Andrea [name]    MRN: [id number] DOB: July 23, 1950  07/17/2019  [name] was observed post Covid-19 immunization for 15 minutes without incident. She was provided with Vaccine Information Sheet and instruction to access the V-Safe system.   [name] was instructed to call 911 with any severe reactions post vaccine: Marland Kitchen Difficulty breathing  . Swelling of face and throat  . A fast heartbeat  . A bad rash all over body  . Dizziness and weakness   Immunizations Administered    Name Date Dose VIS Date Route   Pfizer COVID-19 Vaccine 07/17/2019 10:08 AM 0.3 mL 03/23/2019 Intramuscular   Manufacturer: [employer]   Lot: [id number]   [place]: [id number]

## 2019-07-22 LAB — CUP PACEART REMOTE DEVICE CHECK
Date Time Interrogation Session: 20210411063705
Implantable Pulse Generator Implant Date: 20171019

## 2019-07-23 ENCOUNTER — Ambulatory Visit (INDEPENDENT_AMBULATORY_CARE_PROVIDER_SITE_OTHER): Payer: Medicare Other | Admitting: *Deleted

## 2019-07-23 DIAGNOSIS — I639 Cerebral infarction, unspecified: Secondary | ICD-10-CM | POA: Diagnosis not present

## 2019-07-23 NOTE — Progress Notes (Signed)
ILR Remote 

## 2019-07-26 ENCOUNTER — Encounter: Payer: Self-pay | Admitting: Family Medicine

## 2019-08-10 ENCOUNTER — Encounter: Payer: Self-pay | Admitting: Family Medicine

## 2019-08-12 MED ORDER — BUTALBITAL-APAP-CAFFEINE 50-325-40 MG PO TABS: 1.0000 | ORAL_TABLET | Freq: Four times a day (QID) | ORAL | 0 refills | Status: DC | PRN

## 2019-08-21 ENCOUNTER — Encounter: Payer: Self-pay | Admitting: Family Medicine

## 2019-08-23 LAB — CUP PACEART REMOTE DEVICE CHECK
Date Time Interrogation Session: 20210512064115
Implantable Pulse Generator Implant Date: 20171019

## 2019-08-27 ENCOUNTER — Ambulatory Visit (INDEPENDENT_AMBULATORY_CARE_PROVIDER_SITE_OTHER): Payer: Medicare Other | Admitting: *Deleted

## 2019-08-27 DIAGNOSIS — I639 Cerebral infarction, unspecified: Secondary | ICD-10-CM | POA: Diagnosis not present

## 2019-08-27 NOTE — Progress Notes (Signed)
[facility] Summary Report / Loop Recorder 

## 2019-10-01 ENCOUNTER — Telehealth: Payer: Self-pay

## 2019-10-01 NOTE — Telephone Encounter (Signed)
[facility] alert received 09/30/19 that LINQ has reached RRT. Called patient to inform her that the battery will not longer work resulting on no longer recording. Discussed options with patient regarding plan, states she would like to schedule an apt. To have it removed. Advised patient I will send her information to scheduling and they will call her to set up that apt. Answered questions to the best of my ability. Patient agrees and verbalizes understanding.

## 2019-10-02 NOTE — Telephone Encounter (Signed)
Advised pt a return kit would have been ordered for her to send the monitor back to [employer], if she doesn't receive it by the time of her appt on 8/2, just bring the monitor with her ans we will have a return kit to send it back.

## 2019-10-18 ENCOUNTER — Encounter: Payer: Self-pay | Admitting: Family Medicine

## 2019-10-19 MED ORDER — GABAPENTIN 600 MG PO TABS: ORAL_TABLET | ORAL | 1 refills | Status: DC

## 2019-11-12 ENCOUNTER — Other Ambulatory Visit: Payer: Self-pay

## 2019-11-12 ENCOUNTER — Encounter: Payer: Self-pay | Admitting: Internal Medicine

## 2019-11-12 ENCOUNTER — Ambulatory Visit: Payer: Medicare Other | Admitting: Internal Medicine

## 2019-11-12 VITALS — BP 146/72 | HR 61 | Ht 66.0 in | Wt 184.2 lb

## 2019-11-12 DIAGNOSIS — I639 Cerebral infarction, unspecified: Secondary | ICD-10-CM

## 2019-11-12 DIAGNOSIS — I10 Essential (primary) hypertension: Secondary | ICD-10-CM | POA: Diagnosis not present

## 2019-11-12 DIAGNOSIS — E782 Mixed hyperlipidemia: Secondary | ICD-10-CM | POA: Diagnosis not present

## 2019-11-12 HISTORY — PX: OTHER SURGICAL HISTORY: SHX169

## 2019-11-12 NOTE — Patient Instructions (Signed)
Medication Instructions:  Your physician recommends that you continue on your current medications as directed. Please refer to the Current Medication list given to you today.  Labwork: None ordered.  Testing/Procedures: None ordered.   Your physician wants you to follow-up in: as needed with Dr. Allred.     Implantable Loop Recorder Removal, Care After This sheet gives you information about how to care for yourself after your procedure. Your health care provider may also give you more specific instructions. If you have problems or questions, contact your health care provider. What can I expect after the procedure? After the procedure, it is common to have:  Soreness or discomfort near the incision.  Some swelling or bruising near the incision.  Follow these instructions at home: Incision care  1.  Leave your outer dressing on for 24 hours.  After 24 hours you can remove your outer dressing and shower. 2. Leave adhesive strips in place. These skin closures may need to stay in place for 1-2 weeks. If adhesive strip edges start to loosen and curl up, you may trim the loose edges.  You may remove the strips if they have not fallen off after 2 weeks. 3. Check your incision area every day for signs of infection. Check for: a. Redness, swelling, or pain. b. Fluid or blood. c. Warmth. d. Pus or a bad smell. 4. Do not take baths, swim, or use a hot tub until your incision is completely healed. 5. If your wound site starts to bleed apply pressure.      If you have any questions/concerns please call the device clinic at [phone].  Activity  Return to your normal activities.  Contact a health care provider if:  You have redness, swelling, or pain around your incision.  You have a fever.   
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
## 2019-11-22 ENCOUNTER — Encounter: Payer: Self-pay | Admitting: Family Medicine

## 2019-11-23 NOTE — Telephone Encounter (Signed)
Please advise 

## 2019-11-24 MED ORDER — BUTALBITAL-APAP-CAFFEINE 50-325-40 MG PO TABS: 1.0000 | ORAL_TABLET | Freq: Four times a day (QID) | ORAL | 0 refills | Status: DC | PRN

## 2019-12-10 ENCOUNTER — Encounter: Payer: Self-pay | Admitting: Family Medicine

## 2019-12-11 MED ORDER — CYCLOBENZAPRINE HCL 10 MG PO TABS
10.0000 mg | ORAL_TABLET | Freq: Every day | ORAL | 1 refills | Status: DC
Start: 2019-12-11 — End: 2020-06-12

## 2019-12-11 NOTE — Telephone Encounter (Signed)
Rx sent in electronically. Pt advised to schedule CPE

## 2020-01-09 ENCOUNTER — Other Ambulatory Visit: Payer: Self-pay

## 2020-01-09 ENCOUNTER — Ambulatory Visit (INDEPENDENT_AMBULATORY_CARE_PROVIDER_SITE_OTHER): Payer: Medicare Other

## 2020-01-09 DIAGNOSIS — Z23 Encounter for immunization: Secondary | ICD-10-CM | POA: Diagnosis not present

## 2020-01-09 NOTE — Progress Notes (Signed)
Patient was given her high dose flu vaccine in her Right Deltoid.  Patient tolerated the injection well.

## 2020-01-24 ENCOUNTER — Encounter: Payer: Self-pay | Admitting: Family Medicine

## 2020-01-25 ENCOUNTER — Encounter: Payer: Self-pay | Admitting: Family Medicine

## 2020-01-25 MED ORDER — BUTALBITAL-APAP-CAFFEINE 50-325-40 MG PO TABS: 1.0000 | ORAL_TABLET | Freq: Four times a day (QID) | ORAL | 0 refills | Status: DC | PRN

## 2020-01-25 NOTE — Addendum Note (Signed)
Addended by: Rodrigo Ran on: 01/25/2020 05:24 PM   Modules accepted: Orders

## 2020-02-07 ENCOUNTER — Encounter: Payer: Self-pay | Admitting: Family Medicine

## 2020-02-11 DIAGNOSIS — H35372 Puckering of macula, left eye: Secondary | ICD-10-CM | POA: Diagnosis not present

## 2020-03-10 DIAGNOSIS — H43812 Vitreous degeneration, left eye: Secondary | ICD-10-CM | POA: Diagnosis not present

## 2020-03-12 ENCOUNTER — Telehealth: Payer: Self-pay | Admitting: Family Medicine

## 2020-03-12 NOTE — Telephone Encounter (Signed)
Left message for patient to call back and schedule Medicare Annual Wellness Visit (AWV) either virtually   I mentioned Tuesday 03/18/20 with Otila Kluver Virtual before her cpx on 03/19/20   Last AWV 10/10/2017; please schedule at anytime with [facility] Nurse Health Advisor  2   This should be a 45 minute visit.
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
## 2020-03-19 ENCOUNTER — Other Ambulatory Visit: Payer: Self-pay

## 2020-03-19 ENCOUNTER — Ambulatory Visit (INDEPENDENT_AMBULATORY_CARE_PROVIDER_SITE_OTHER): Payer: Medicare Other

## 2020-03-19 ENCOUNTER — Encounter: Payer: Self-pay | Admitting: Family Medicine

## 2020-03-19 ENCOUNTER — Ambulatory Visit (INDEPENDENT_AMBULATORY_CARE_PROVIDER_SITE_OTHER): Payer: Medicare Other | Admitting: Family Medicine

## 2020-03-19 VITALS — BP 158/80 | HR 81 | Ht 66.0 in | Wt 177.0 lb

## 2020-03-19 DIAGNOSIS — Z0000 Encounter for general adult medical examination without abnormal findings: Secondary | ICD-10-CM

## 2020-03-19 MED ORDER — GABAPENTIN 600 MG PO TABS
ORAL_TABLET | ORAL | 1 refills | Status: DC
Start: 2020-03-19 — End: 2020-10-06

## 2020-03-19 MED ORDER — LISINOPRIL 5 MG PO TABS
5.0000 mg | ORAL_TABLET | Freq: Every day | ORAL | 3 refills | Status: DC
Start: 2020-03-19 — End: 2020-04-09

## 2020-03-19 MED ORDER — PANTOPRAZOLE SODIUM 40 MG PO TBEC
40.0000 mg | DELAYED_RELEASE_TABLET | Freq: Every day | ORAL | 3 refills | Status: DC
Start: 2020-03-19 — End: 2021-04-27

## 2020-03-19 MED ORDER — ROSUVASTATIN CALCIUM 40 MG PO TABS
40.0000 mg | ORAL_TABLET | Freq: Every day | ORAL | 3 refills | Status: DC
Start: 2020-03-19 — End: 2021-04-27
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
## 2020-03-19 NOTE — Patient Instructions (Signed)
Andrea [name] , Thank you for taking time to come for your Medicare Wellness Visit. I appreciate your ongoing commitment to your health goals. Please review the following plan we discussed and let me know if I can assist you in the future.   Screening recommendations/referrals: Colonoscopy: Up to date, next due 05/09/2028 Mammogram: Currently due, please keep your appointment with your GYN Bone Density: Currently due please keep your appointment with your GYN  Recommended yearly ophthalmology/optometry visit for glaucoma screening and checkup Recommended yearly dental visit for hygiene and checkup  Vaccinations: Influenza vaccine: Up to date, next due fall 2022  Pneumococcal vaccine: completed series  Tdap vaccine: Up to date, next due 01/20/2025 Shingles vaccine: Currently due for Shingrix, if you wish to receive we recommend that you do so at your local pharmacy     Advanced directives: Please bring in a copy of your advanced medical directives so that we may scan them into your chart.  Conditions/risks identified: None   Next appointment: None    Preventive Care 65 Years and Older, Female Preventive care refers to lifestyle choices and visits with your health care provider that can promote health and wellness. What does preventive care include?  A yearly physical exam. This is also called an annual well check.  Dental exams once or twice a year.  Routine eye exams. Ask your health care provider how often you should have your eyes checked.  Personal lifestyle choices, including:  Daily care of your teeth and gums.  Regular physical activity.  Eating a healthy diet.  Avoiding tobacco and drug use.  Limiting alcohol use.  Practicing safe sex.  Taking low-dose aspirin every day.  Taking vitamin and mineral supplements as recommended by your health care provider. What happens during an annual well check? The services and screenings done by your health care provider  during your annual well check will depend on your age, overall health, lifestyle risk factors, and family history of disease. Counseling  Your health care provider may ask you questions about your:  Alcohol use.  Tobacco use.  Drug use.  Emotional well-being.  Home and relationship well-being.  Sexual activity.  Eating habits.  History of falls.  Memory and ability to understand (cognition).  Work and work Statistician.  Reproductive health. Screening  You may have the following tests or measurements:  Height, weight, and BMI.  Blood pressure.  Lipid and cholesterol levels. These may be checked every 5 years, or more frequently if you are over 51 years old.  Skin check.  Lung cancer screening. You may have this screening every year starting at age 23 if you have a 30-pack-year history of smoking and currently smoke or have quit within the past 15 years.  Fecal occult blood test (FOBT) of the stool. You may have this test every year starting at age 38.  Flexible sigmoidoscopy or colonoscopy. You may have a sigmoidoscopy every 5 years or a colonoscopy every 10 years starting at age 58.  Hepatitis C blood test.  Hepatitis B blood test.  Sexually transmitted disease (STD) testing.  Diabetes screening. This is done by checking your blood sugar (glucose) after you have not eaten for a while (fasting). You may have this done every 1-3 years.  Bone density scan. This is done to screen for osteoporosis. You may have this done starting at age 52.  Mammogram. This may be done every 1-2 years. Talk to your health care provider about how often you should have regular mammograms.  Talk with your health care provider about your test results, treatment options, and if necessary, the need for more tests. Vaccines  Your health care provider may recommend certain vaccines, such as:  Influenza vaccine. This is recommended every year.  Tetanus, diphtheria, and acellular pertussis  (Tdap, Td) vaccine. You may need a Td booster every 10 years.  Zoster vaccine. You may need this after age 21.  Pneumococcal 13-valent conjugate (PCV13) vaccine. One dose is recommended after age 67.  Pneumococcal polysaccharide (PPSV23) vaccine. One dose is recommended after age 2. Talk to your health care provider about which screenings and vaccines you need and how often you need them. This information is not intended to replace advice given to you by your health care provider. Make sure you discuss any questions you have with your health care provider. Document Released: 04/25/2015 Document Revised: 12/17/2015 Document Reviewed: 01/28/2015 Elsevier Interactive Patient Education  2017 [place] Prevention in the Home Falls can cause injuries. They can happen to people of all ages. There are many things you can do to make your home safe and to help prevent falls. What can I do on the outside of my home?  Regularly fix the edges of walkways and driveways and fix any cracks.  Remove anything that might make you trip as you walk through a door, such as a raised step or threshold.  Trim any bushes or trees on the path to your home.  Use bright outdoor lighting.  Clear any walking paths of anything that might make someone trip, such as rocks or tools.  Regularly check to see if handrails are loose or broken. Make sure that both sides of any steps have handrails.  Any raised decks and porches should have guardrails on the edges.  Have any leaves, snow, or ice cleared regularly.  Use sand or salt on walking paths during winter.  Clean up any spills in your garage right away. This includes oil or grease spills. What can I do in the bathroom?  Use night lights.  Install grab bars by the toilet and in the tub and shower. Do not use towel bars as grab bars.  Use non-skid mats or decals in the tub or shower.  If you need to sit down in the shower, use a plastic, non-slip  stool.  Keep the floor dry. Clean up any water that spills on the floor as soon as it happens.  Remove soap buildup in the tub or shower regularly.  Attach bath mats securely with double-sided non-slip rug tape.  Do not have throw rugs and other things on the floor that can make you trip. What can I do in the bedroom?  Use night lights.  Make sure that you have a light by your bed that is easy to reach.  Do not use any sheets or blankets that are too big for your bed. They should not hang down onto the floor.  Have a firm chair that has side arms. You can use this for support while you get dressed.  Do not have throw rugs and other things on the floor that can make you trip. What can I do in the kitchen?  Clean up any spills right away.  Avoid walking on wet floors.  Keep items that you use a lot in easy-to-reach places.  If you need to reach something above you, use a strong step stool that has a grab bar.  Keep electrical cords out of the way.  Do not use floor polish or wax that makes floors slippery. If you must use wax, use non-skid floor wax.  Do not have throw rugs and other things on the floor that can make you trip. What can I do with my stairs?  Do not leave any items on the stairs.  Make sure that there are handrails on both sides of the stairs and use them. Fix handrails that are broken or loose. Make sure that handrails are as long as the stairways.  Check any carpeting to make sure that it is firmly attached to the stairs. Fix any carpet that is loose or worn.  Avoid having throw rugs at the top or bottom of the stairs. If you do have throw rugs, attach them to the floor with carpet tape.  Make sure that you have a light switch at the top of the stairs and the bottom of the stairs. If you do not have them, ask someone to add them for you. What else can I do to help prevent falls?  Wear shoes that:  Do not have high heels.  Have rubber bottoms.  Are  comfortable and fit you well.  Are closed at the toe. Do not wear sandals.  If you use a stepladder:  Make sure that it is fully opened. Do not climb a closed stepladder.  Make sure that both sides of the stepladder are locked into place.  Ask someone to hold it for you, if possible.  Clearly mark and make sure that you can see:  Any grab bars or handrails.  First and last steps.  Where the edge of each step is.  Use tools that help you move around (mobility aids) if they are needed. These include:  Canes.  Walkers.  Scooters.  Crutches.  Turn on the lights when you go into a dark area. Replace any light bulbs as soon as they burn out.  Set up your furniture so you have a clear path. Avoid moving your furniture around.  If any of your floors are uneven, fix them.  If there are any pets around you, be aware of where they are.  Review your medicines with your doctor. Some medicines can make you feel dizzy. This can increase your chance of falling. Ask your doctor what other things that you can do to help prevent falls. This information is not intended to replace advice given to you by your health care provider. Make sure you discuss any questions you have with your health care provider. Document Released: 01/23/2009 Document Revised: 09/04/2015 Document Reviewed: 05/03/2014 Elsevier Interactive Patient Education  2017 [employer].

## 2020-03-19 NOTE — Addendum Note (Signed)
Addended by: Marrion Coy on: 03/19/2020 10:06 AM   Modules accepted: Orders

## 2020-03-19 NOTE — Patient Instructions (Addendum)
Preventive Care 38 Years and Older, Female Preventive care refers to lifestyle choices and visits with your health care provider that can promote health and wellness. This includes:  A yearly physical exam. This is also called an annual well check.  Regular dental and eye exams.  Immunizations.  Screening for certain conditions.  Healthy lifestyle choices, such as diet and exercise. What can I expect for my preventive care visit? Physical exam Your health care provider will check:  Height and weight. These may be used to calculate body mass index (BMI), which is a measurement that tells if you are at a healthy weight.  Heart rate and blood pressure.  Your skin for abnormal spots. Counseling Your health care provider may ask you questions about:  Alcohol, tobacco, and drug use.  Emotional well-being.  Home and relationship well-being.  Sexual activity.  Eating habits.  History of falls.  Memory and ability to understand (cognition).  Work and work Statistician.  Pregnancy and menstrual history. What immunizations do I need?  Influenza (flu) vaccine  This is recommended every year. Tetanus, diphtheria, and pertussis (Tdap) vaccine  You may need a Td booster every 10 years. Varicella (chickenpox) vaccine  You may need this vaccine if you have not already been vaccinated. Zoster (shingles) vaccine  You may need this after age 33. Pneumococcal conjugate (PCV13) vaccine  One dose is recommended after age 33. Pneumococcal polysaccharide (PPSV23) vaccine  One dose is recommended after age 72. Measles, mumps, and rubella (MMR) vaccine  You may need at least one dose of MMR if you were born in 1957 or later. You may also need a second dose. Meningococcal conjugate (MenACWY) vaccine  You may need this if you have certain conditions. Hepatitis A vaccine  You may need this if you have certain conditions or if you travel or work in places where you may be exposed  to hepatitis A. Hepatitis B vaccine  You may need this if you have certain conditions or if you travel or work in places where you may be exposed to hepatitis B. Haemophilus influenzae type b (Hib) vaccine  You may need this if you have certain conditions. You may receive vaccines as individual doses or as more than one vaccine together in one shot (combination vaccines). Talk with your health care provider about the risks and benefits of combination vaccines. What tests do I need? Blood tests  Lipid and cholesterol levels. These may be checked every 5 years, or more frequently depending on your overall health.  Hepatitis C test.  Hepatitis B test. Screening  Lung cancer screening. You may have this screening every year starting at age 39 if you have a 30-pack-year history of smoking and currently smoke or have quit within the past 15 years.  Colorectal cancer screening. All adults should have this screening starting at age 36 and continuing until age 15. Your health care provider may recommend screening at age 23 if you are at increased risk. You will have tests every 1-10 years, depending on your results and the type of screening test.  Diabetes screening. This is done by checking your blood sugar (glucose) after you have not eaten for a while (fasting). You may have this done every 1-3 years.  Mammogram. This may be done every 1-2 years. Talk with your health care provider about how often you should have regular mammograms.  BRCA-related cancer screening. This may be done if you have a family history of breast, ovarian, tubal, or peritoneal cancers.  Other tests  Sexually transmitted disease (STD) testing.  Bone density scan. This is done to screen for osteoporosis. You may have this done starting at age 64. Follow these instructions at home: Eating and drinking  Eat a diet that includes fresh fruits and vegetables, whole grains, lean protein, and low-fat dairy products. Limit  your intake of foods with high amounts of sugar, saturated fats, and salt.  Take vitamin and mineral supplements as recommended by your health care provider.  Do not drink alcohol if your health care provider tells you not to drink.  If you drink alcohol: ? Limit how much you have to 0-1 drink a day. ? Be aware of how much alcohol is in your drink. In the [place] one drink equals one 12 oz bottle of beer (355 mL), one 5 oz glass of wine (148 mL), or one 1 oz glass of hard liquor (44 mL). Lifestyle  Take daily care of your teeth and gums.  Stay active. Exercise for at least 30 minutes on 5 or more days each week.  Do not use any products that contain nicotine or tobacco, such as cigarettes, e-cigarettes, and chewing tobacco. If you need help quitting, ask your health care provider.  If you are sexually active, practice safe sex. Use a condom or other form of protection in order to prevent STIs (sexually transmitted infections).  Talk with your health care provider about taking a low-dose aspirin or statin. What's next?  Go to your health care provider once a year for a well check visit.  Ask your health care provider how often you should have your eyes and teeth checked.  Stay up to date on all vaccines. This information is not intended to replace advice given to you by your health care provider. Make sure you discuss any questions you have with your health care provider. Document Revised: 03/23/2018 Document Reviewed: 03/23/2018 Elsevier Patient Education  [place] LISINOPRIL TO 10 MG DAILY AND SET UP 3 WEEK FOLLOW UP.

## 2020-03-20 ENCOUNTER — Encounter: Payer: Self-pay | Admitting: Family Medicine

## 2020-03-20 LAB — HEPATIC FUNCTION PANEL
AG Ratio: 2.1 (calc) (ref 1.0–2.5)
ALT: 13 U/L (ref 6–29)
AST: 17 U/L (ref 10–35)
Albumin: 4.5 g/dL (ref 3.6–5.1)
Alkaline phosphatase (APISO): 108 U/L (ref 37–153)
Bilirubin, Direct: 0.1 mg/dL (ref 0.0–0.2)
Globulin: 2.1 g/dL (calc) (ref 1.9–3.7)
Indirect Bilirubin: 0.3 mg/dL (calc) (ref 0.2–1.2)
Total Bilirubin: 0.4 mg/dL (ref 0.2–1.2)
Total Protein: 6.6 g/dL (ref 6.1–8.1)

## 2020-03-20 LAB — BASIC METABOLIC PANEL
BUN/Creatinine Ratio: 12 (calc) (ref 6–22)
BUN: 14 mg/dL (ref 7–25)
CO2: 31 mmol/L (ref 20–32)
Calcium: 9.6 mg/dL (ref 8.6–10.4)
Chloride: 103 mmol/L (ref 98–110)
Creat: 1.18 mg/dL — ABNORMAL HIGH (ref 0.50–0.99)
Glucose, Bld: 103 mg/dL — ABNORMAL HIGH (ref 65–99)
Potassium: 4.5 mmol/L (ref 3.5–5.3)
Sodium: 140 mmol/L (ref 135–146)

## 2020-03-20 LAB — LIPID PANEL
Cholesterol: 145 mg/dL (ref <200)
HDL: 51 mg/dL (ref >=50)
LDL Cholesterol (Calc): 71 mg/dL (calc)
Non-HDL Cholesterol (Calc): 94 mg/dL (calc) (ref <130)
Total CHOL/HDL Ratio: 2.8 (calc) (ref <5.0)
Triglycerides: 152 mg/dL — ABNORMAL HIGH (ref <150)

## 2020-03-20 LAB — CBC WITH DIFFERENTIAL/PLATELET
Absolute Monocytes: 432 cells/uL (ref 200–950)
Basophils Absolute: 58 cells/uL (ref 0–200)
Basophils Relative: 0.8 %
Eosinophils Absolute: 130 cells/uL (ref 15–500)
Eosinophils Relative: 1.8 %
HCT: 42.9 % (ref 35.0–45.0)
Hemoglobin: 14 g/dL (ref 11.7–15.5)
Lymphs Abs: 1606 cells/uL (ref 850–3900)
MCH: 28.2 pg (ref 27.0–33.0)
MCHC: 32.6 g/dL (ref 32.0–36.0)
MCV: 86.3 fL (ref 80.0–100.0)
MPV: 10.7 fL (ref 7.5–12.5)
Monocytes Relative: 6 %
Neutro Abs: 4975 cells/uL (ref 1500–7800)
Neutrophils Relative %: 69.1 %
Platelets: 243 10*3/uL (ref 140–400)
RBC: 4.97 10*6/uL (ref 3.80–5.10)
RDW: 12.1 % (ref 11.0–15.0)
Total Lymphocyte: 22.3 %
WBC: 7.2 10*3/uL (ref 3.8–10.8)

## 2020-03-20 LAB — TSH: TSH: 1.4 mIU/L (ref 0.40–4.50)

## 2020-03-21 NOTE — Telephone Encounter (Signed)
Gabapentin was sent to mail order 03/19/20, please send Fioricet to [facility]

## 2020-03-22 MED ORDER — BUTALBITAL-APAP-CAFFEINE 50-325-40 MG PO TABS: 1.0000 | ORAL_TABLET | Freq: Four times a day (QID) | ORAL | 0 refills | Status: DC | PRN

## 2020-03-25 DIAGNOSIS — H43811 Vitreous degeneration, right eye: Secondary | ICD-10-CM | POA: Diagnosis not present

## 2020-04-08 ENCOUNTER — Other Ambulatory Visit: Payer: Self-pay

## 2020-04-09 ENCOUNTER — Encounter: Payer: Self-pay | Admitting: Family Medicine

## 2020-04-09 ENCOUNTER — Other Ambulatory Visit: Payer: Self-pay

## 2020-04-09 ENCOUNTER — Ambulatory Visit (INDEPENDENT_AMBULATORY_CARE_PROVIDER_SITE_OTHER): Payer: Medicare Other | Admitting: Family Medicine

## 2020-04-09 VITALS — BP 148/98 | HR 89 | Ht 66.0 in | Wt 176.0 lb

## 2020-04-09 DIAGNOSIS — I10 Essential (primary) hypertension: Secondary | ICD-10-CM

## 2020-04-09 DIAGNOSIS — Z8673 Personal history of transient ischemic attack (TIA), and cerebral infarction without residual deficits: Secondary | ICD-10-CM

## 2020-04-09 MED ORDER — LISINOPRIL-HYDROCHLOROTHIAZIDE 20-12.5 MG PO TABS: 1.0000 | ORAL_TABLET | Freq: Every day | ORAL | 3 refills | Status: DC
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
## 2020-04-09 NOTE — Patient Instructions (Signed)
DASH Eating Plan DASH stands for "Dietary Approaches to Stop Hypertension." The DASH eating plan is a healthy eating plan that has been shown to reduce high blood pressure (hypertension). It may also reduce your risk for type 2 diabetes, heart disease, and stroke. The DASH eating plan may also help with weight loss. What are tips for following this plan?  General guidelines  Avoid eating more than 2,300 mg (milligrams) of salt (sodium) a day. If you have hypertension, you may need to reduce your sodium intake to 1,500 mg a day.  Limit alcohol intake to no more than 1 drink a day for nonpregnant women and 2 drinks a day for men. One drink equals 12 oz of beer, 5 oz of wine, or 1 oz of hard liquor.  Work with your health care provider to maintain a healthy body weight or to lose weight. Ask what an ideal weight is for you.  Get at least 30 minutes of exercise that causes your heart to beat faster (aerobic exercise) most days of the week. Activities may include walking, swimming, or biking.  Work with your health care provider or diet and nutrition specialist (dietitian) to adjust your eating plan to your individual calorie needs. Reading food labels   Check food labels for the amount of sodium per serving. Choose foods with less than 5 percent of the Daily Value of sodium. Generally, foods with less than 300 mg of sodium per serving fit into this eating plan.  To find whole grains, look for the word "whole" as the first word in the ingredient list. Shopping  Buy products labeled as "low-sodium" or "no salt added."  Buy fresh foods. Avoid canned foods and premade or frozen meals. Cooking  Avoid adding salt when cooking. Use salt-free seasonings or herbs instead of table salt or sea salt. Check with your health care provider or pharmacist before using salt substitutes.  Do not fry foods. Cook foods using healthy methods such as baking, boiling, grilling, and broiling instead.  Cook with  heart-healthy oils, such as olive, canola, soybean, or sunflower oil. Meal planning  Eat a balanced diet that includes: ? 5 or more servings of fruits and vegetables each day. At each meal, try to fill half of your plate with fruits and vegetables. ? Up to 6-8 servings of whole grains each day. ? Less than 6 oz of lean meat, poultry, or fish each day. A 3-oz serving of meat is about the same size as a deck of cards. One egg equals 1 oz. ? 2 servings of low-fat dairy each day. ? A serving of nuts, seeds, or beans 5 times each week. ? Heart-healthy fats. Healthy fats called Omega-3 fatty acids are found in foods such as flaxseeds and coldwater fish, like sardines, salmon, and mackerel.  Limit how much you eat of the following: ? Canned or prepackaged foods. ? Food that is high in trans fat, such as fried foods. ? Food that is high in saturated fat, such as fatty meat. ? Sweets, desserts, sugary drinks, and other foods with added sugar. ? Full-fat dairy products.  Do not salt foods before eating.  Try to eat at least 2 vegetarian meals each week.  Eat more home-cooked food and less restaurant, buffet, and fast food.  When eating at a restaurant, ask that your food be prepared with less salt or no salt, if possible. What foods are recommended? The items listed may not be a complete list. Talk with your dietitian about   what dietary choices are best for you. Grains Whole-grain or whole-wheat bread. Whole-grain or whole-wheat pasta. Brown rice. Oatmeal. Quinoa. Bulgur. Whole-grain and low-sodium cereals. Pita bread. Low-fat, low-sodium crackers. Whole-wheat flour tortillas. Vegetables Fresh or frozen vegetables (raw, steamed, roasted, or grilled). Low-sodium or reduced-sodium tomato and vegetable juice. Low-sodium or reduced-sodium tomato sauce and tomato paste. Low-sodium or reduced-sodium canned vegetables. Fruits All fresh, dried, or frozen fruit. Canned fruit in natural juice (without  added sugar). Meat and other protein foods Skinless chicken or turkey. Ground chicken or turkey. Pork with fat trimmed off. Fish and seafood. Egg whites. Dried beans, peas, or lentils. Unsalted nuts, nut butters, and seeds. Unsalted canned beans. Lean cuts of beef with fat trimmed off. Low-sodium, lean deli meat. Dairy Low-fat (1%) or fat-free (skim) milk. Fat-free, low-fat, or reduced-fat cheeses. Nonfat, low-sodium ricotta or cottage cheese. Low-fat or nonfat yogurt. Low-fat, low-sodium cheese. Fats and oils Soft margarine without trans fats. Vegetable oil. Low-fat, reduced-fat, or light mayonnaise and salad dressings (reduced-sodium). Canola, safflower, olive, soybean, and sunflower oils. Avocado. Seasoning and other foods Herbs. Spices. Seasoning mixes without salt. Unsalted popcorn and pretzels. Fat-free sweets. What foods are not recommended? The items listed may not be a complete list. Talk with your dietitian about what dietary choices are best for you. Grains Baked goods made with fat, such as croissants, muffins, or some breads. Dry pasta or rice meal packs. Vegetables Creamed or fried vegetables. Vegetables in a cheese sauce. Regular canned vegetables (not low-sodium or reduced-sodium). Regular canned tomato sauce and paste (not low-sodium or reduced-sodium). Regular tomato and vegetable juice (not low-sodium or reduced-sodium). Pickles. Olives. Fruits Canned fruit in a light or heavy syrup. Fried fruit. Fruit in cream or butter sauce. Meat and other protein foods Fatty cuts of meat. Ribs. Fried meat. Bacon. Sausage. Bologna and other processed lunch meats. Salami. Fatback. Hotdogs. Bratwurst. Salted nuts and seeds. Canned beans with added salt. Canned or smoked fish. Whole eggs or egg yolks. Chicken or turkey with skin. Dairy Whole or 2% milk, cream, and half-and-half. Whole or full-fat cream cheese. Whole-fat or sweetened yogurt. Full-fat cheese. Nondairy creamers. Whipped toppings.  Processed cheese and cheese spreads. Fats and oils Butter. Stick margarine. Lard. Shortening. Ghee. Bacon fat. Tropical oils, such as coconut, palm kernel, or palm oil. Seasoning and other foods Salted popcorn and pretzels. Onion salt, garlic salt, seasoned salt, table salt, and sea salt. Worcestershire sauce. Tartar sauce. Barbecue sauce. Teriyaki sauce. Soy sauce, including reduced-sodium. Steak sauce. Canned and packaged gravies. Fish sauce. Oyster sauce. Cocktail sauce. Horseradish that you find on the shelf. Ketchup. Mustard. Meat flavorings and tenderizers. Bouillon cubes. Hot sauce and Tabasco sauce. Premade or packaged marinades. Premade or packaged taco seasonings. Relishes. Regular salad dressings. Where to find more information:  National Heart, Lung, and Blood Institute: www.nhlbi.nih.gov  American Heart Association: www.heart.org Summary  The DASH eating plan is a healthy eating plan that has been shown to reduce high blood pressure (hypertension). It may also reduce your risk for type 2 diabetes, heart disease, and stroke.  With the DASH eating plan, you should limit salt (sodium) intake to 2,300 mg a day. If you have hypertension, you may need to reduce your sodium intake to 1,500 mg a day.  When on the DASH eating plan, aim to eat more fresh fruits and vegetables, whole grains, lean proteins, low-fat dairy, and heart-healthy fats.  Work with your health care provider or diet and nutrition specialist (dietitian) to adjust your eating plan to your   individual calorie needs. This information is not intended to replace advice given to you by your health care provider. Make sure you discuss any questions you have with your health care provider. Document Revised: 03/11/2017 Document Reviewed: 03/22/2016 Elsevier Patient Education  2020 Else[employer] Inc.  

## 2020-04-15 ENCOUNTER — Encounter: Payer: Self-pay | Admitting: Family Medicine

## 2020-04-16 ENCOUNTER — Encounter: Payer: Self-pay | Admitting: Family Medicine

## 2020-04-17 ENCOUNTER — Telehealth: Payer: Self-pay

## 2020-04-17 MED ORDER — AMLODIPINE BESYLATE 5 MG PO TABS: 5.0000 mg | ORAL_TABLET | Freq: Every day | ORAL | 0 refills | Status: DC

## 2020-04-17 NOTE — Telephone Encounter (Signed)
See previous my chart message from 04/15/2020.    Patient stated that she has a follow up appointment on 05/12/20

## 2020-05-12 ENCOUNTER — Ambulatory Visit (INDEPENDENT_AMBULATORY_CARE_PROVIDER_SITE_OTHER): Payer: Medicare Other | Admitting: Family Medicine

## 2020-05-12 ENCOUNTER — Other Ambulatory Visit: Payer: Self-pay

## 2020-05-12 ENCOUNTER — Encounter: Payer: Self-pay | Admitting: Family Medicine

## 2020-05-12 VITALS — BP 140/76 | HR 93 | Ht 66.0 in | Wt 178.0 lb

## 2020-05-12 DIAGNOSIS — I10 Essential (primary) hypertension: Secondary | ICD-10-CM

## 2020-05-12 MED ORDER — AMLODIPINE BESYLATE 5 MG PO TABS: 5.0000 mg | ORAL_TABLET | Freq: Every day | ORAL | 3 refills | Status: DC

## 2020-05-12 MED ORDER — LISINOPRIL 20 MG PO TABS: 20.0000 mg | ORAL_TABLET | Freq: Every day | ORAL | 3 refills | Status: DC
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
## 2020-05-13 MED ORDER — BUTALBITAL-APAP-CAFFEINE 50-325-40 MG PO TABS: 1.0000 | ORAL_TABLET | Freq: Four times a day (QID) | ORAL | 0 refills | Status: DC | PRN

## 2020-05-15 DIAGNOSIS — H26492 Other secondary cataract, left eye: Secondary | ICD-10-CM | POA: Diagnosis not present

## 2020-05-15 DIAGNOSIS — H35371 Puckering of macula, right eye: Secondary | ICD-10-CM | POA: Diagnosis not present

## 2020-05-15 DIAGNOSIS — H26493 Other secondary cataract, bilateral: Secondary | ICD-10-CM | POA: Diagnosis not present

## 2020-06-04 DIAGNOSIS — H5213 Myopia, bilateral: Secondary | ICD-10-CM | POA: Diagnosis not present

## 2020-06-05 DIAGNOSIS — Z6828 Body mass index (BMI) 28.0-28.9, adult: Secondary | ICD-10-CM | POA: Diagnosis not present

## 2020-06-05 DIAGNOSIS — Z01419 Encounter for gynecological examination (general) (routine) without abnormal findings: Secondary | ICD-10-CM | POA: Diagnosis not present

## 2020-06-05 DIAGNOSIS — Z1231 Encounter for screening mammogram for malignant neoplasm of breast: Secondary | ICD-10-CM | POA: Diagnosis not present

## 2020-06-12 ENCOUNTER — Other Ambulatory Visit: Payer: Self-pay

## 2020-06-12 ENCOUNTER — Encounter: Payer: Self-pay | Admitting: Family Medicine

## 2020-06-12 MED ORDER — CYCLOBENZAPRINE HCL 10 MG PO TABS: 10.0000 mg | ORAL_TABLET | Freq: Every day | ORAL | 1 refills | Status: DC

## 2020-06-25 DIAGNOSIS — H35373 Puckering of macula, bilateral: Secondary | ICD-10-CM | POA: Diagnosis not present

## 2020-06-27 DIAGNOSIS — H04123 Dry eye syndrome of bilateral lacrimal glands: Secondary | ICD-10-CM | POA: Diagnosis not present

## 2020-06-27 DIAGNOSIS — H35371 Puckering of macula, right eye: Secondary | ICD-10-CM | POA: Diagnosis not present

## 2020-06-30 ENCOUNTER — Encounter: Payer: Self-pay | Admitting: Family Medicine

## 2020-06-30 ENCOUNTER — Other Ambulatory Visit: Payer: Self-pay

## 2020-06-30 ENCOUNTER — Ambulatory Visit (INDEPENDENT_AMBULATORY_CARE_PROVIDER_SITE_OTHER): Payer: Medicare Other | Admitting: Family Medicine

## 2020-06-30 VITALS — BP 116/70 | HR 96 | Temp 98.0°F | Ht 66.0 in | Wt 173.1 lb

## 2020-06-30 DIAGNOSIS — I10 Essential (primary) hypertension: Secondary | ICD-10-CM
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
## 2020-07-03 ENCOUNTER — Encounter: Payer: Self-pay | Admitting: Family Medicine

## 2020-07-03 DIAGNOSIS — M25519 Pain in unspecified shoulder: Secondary | ICD-10-CM

## 2020-07-07 NOTE — Progress Notes (Signed)
° ° °  Subjective:    CC: L shoulder pain  I, Molly Weber, LAT, ATC, am serving as scribe for Dr. Lynne Leader.  HPI: Pt is a 70 y/o female presenting w/ c/o L shoulder pain x several weeks w/ no known MOI.  However, she states that she recently moved and was doing a lot of lifting.  She locates her pain to her L upper arm pain.  She's had multiple spinal surgeries including an ant cervical fusion.  Radiating pain: yes into her L arm Neck pain: yes but chronic Shoulder mechanical symptoms: yes Aggravating factors: L shoulder extension and horizontal aBd; L shoulder functional IR; laying on her L side Treatments tried: Tylenol Arthritis  Pertinent review of Systems: No fevers or chills  Relevant historical information: Hypertension, history of stroke.  History of cervical neck fusion.   Objective:    Vitals:   07/08/20 1034  BP: 120/78  Pulse: 88  SpO2: 96%   General: Well Developed, well nourished, and in no acute distress.   MSK: C-spine normal-appearing nontender decreased cervical motion. Left shoulder normal-appearing Range of motion normal abduction limited internal rotation normal external rotation. Strength reduced strength to abduction 4/5 internal rotation external rotation normal. Positive Hawkins and Neer's test.  Positive empty can test. Negative Yergason's and speeds test.  Lab and Radiology Results  X-ray images left shoulder obtained today personally and independently interpreted No significant degenerative change.  No fractures.  Cervical hardware present Await formal radiology review   Impression and Recommendations:    Assessment and Plan: 70 y.o. female with left shoulder pain thought to be due to subacromial bursitis/impingement/rotator cuff tendinopathy.  Plan for physical therapy trial.  Recheck back in about 6 weeks.  Return sooner if needed.Marland Kitchen  PDMP not reviewed this encounter. Orders Placed This Encounter  Procedures   DG Shoulder Left     Standing Status:   Future    Number of Occurrences:   1    Standing Expiration Date:   07/08/2021    Order Specific Question:   Reason for Exam (SYMPTOM  OR DIAGNOSIS REQUIRED)    Answer:   eval left shoulder    Order Specific Question:   Preferred imaging location?    Answer:   [facility]   Ambulatory referral to Physical Therapy    Referral Priority:   Routine    Referral Type:   Physical Medicine    Referral Reason:   Specialty Services Required    Requested Specialty:   Physical Therapy   No orders of the defined types were placed in this encounter.   Discussed warning signs or symptoms. Please see discharge instructions. Patient expresses understanding.   The above documentation has been reviewed and is accurate and complete Lynne Leader, M.D.

## 2020-07-08 ENCOUNTER — Other Ambulatory Visit: Payer: Self-pay

## 2020-07-08 ENCOUNTER — Ambulatory Visit: Payer: Medicare Other | Admitting: Family Medicine

## 2020-07-08 ENCOUNTER — Ambulatory Visit: Payer: Self-pay

## 2020-07-08 ENCOUNTER — Ambulatory Visit (INDEPENDENT_AMBULATORY_CARE_PROVIDER_SITE_OTHER): Payer: Medicare Other

## 2020-07-08 ENCOUNTER — Encounter: Payer: Self-pay | Admitting: Family Medicine

## 2020-07-08 VITALS — BP 120/78 | HR 88 | Ht 66.0 in | Wt 177.6 lb

## 2020-07-08 DIAGNOSIS — M12812 Other specific arthropathies, not elsewhere classified, left shoulder: Secondary | ICD-10-CM | POA: Diagnosis not present

## 2020-07-08 DIAGNOSIS — M25512 Pain in left shoulder: Secondary | ICD-10-CM

## 2020-07-08 NOTE — Patient Instructions (Signed)
Thank you for coming in today.  I've referred you to Physical Therapy.  Let us know if you don't hear from them in one week.  Please get an Xray today before you leave  Return in 6 weeks.   Let me know if there is a problem.  We can do moe sooner if needed.

## 2020-07-09 ENCOUNTER — Other Ambulatory Visit: Payer: Self-pay

## 2020-07-09 ENCOUNTER — Encounter: Payer: Self-pay | Admitting: Physical Therapy

## 2020-07-09 ENCOUNTER — Ambulatory Visit: Payer: Medicare Other | Admitting: Physical Therapy

## 2020-07-09 DIAGNOSIS — M6281 Muscle weakness (generalized): Secondary | ICD-10-CM | POA: Diagnosis not present

## 2020-07-09 DIAGNOSIS — M25512 Pain in left shoulder: Secondary | ICD-10-CM | POA: Diagnosis not present

## 2020-07-09 NOTE — Progress Notes (Signed)
X-ray left shoulder shows mild arthritis
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
## 2020-07-09 NOTE — Patient Instructions (Signed)
Access Code: [email or web address] URL: [email or web address] Date: 07/09/2020 Prepared by: Daleen Bo  Exercises Standing Isometric Shoulder Internal Rotation at Doorway - 3 x daily - 7 x weekly - 1 sets - 10 reps - 3 hold Standing Isometric Shoulder External Rotation with Doorway - 3 x daily - 7 x weekly - 1 sets - 10 reps - 3 hold
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
## 2020-07-14 ENCOUNTER — Encounter: Payer: Self-pay | Admitting: Physical Therapy

## 2020-07-14 ENCOUNTER — Ambulatory Visit: Payer: Medicare Other | Admitting: Physical Therapy

## 2020-07-14 ENCOUNTER — Other Ambulatory Visit: Payer: Self-pay

## 2020-07-14 DIAGNOSIS — M6281 Muscle weakness (generalized): Secondary | ICD-10-CM | POA: Diagnosis not present

## 2020-07-14 DIAGNOSIS — M25512 Pain in left shoulder: Secondary | ICD-10-CM | POA: Diagnosis not present
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
## 2020-07-14 NOTE — Patient Instructions (Signed)
Access Code: [email or web address] URL: [email or web address] Date: 07/14/2020 Prepared by: Daleen Bo  Exercises Standing Isometric Shoulder Internal Rotation at Doorway - 3 x daily - 7 x weekly - 1 sets - 10 reps - 3 hold Standing Isometric Shoulder External Rotation with Doorway - 3 x daily - 7 x weekly - 1 sets - 10 reps - 3 hold Sidelying Shoulder External Rotation - 1 x daily - 7 x weekly - 2 sets - 10 reps Wall Push Up - 1 x daily - 7 x weekly - 2 sets - 10 reps

## 2020-07-16 ENCOUNTER — Ambulatory Visit: Payer: Medicare Other | Admitting: Physical Therapy

## 2020-07-16 ENCOUNTER — Other Ambulatory Visit: Payer: Self-pay

## 2020-07-16 ENCOUNTER — Encounter: Payer: Self-pay | Admitting: Physical Therapy

## 2020-07-16 DIAGNOSIS — M25512 Pain in left shoulder: Secondary | ICD-10-CM | POA: Diagnosis not present

## 2020-07-16 DIAGNOSIS — M6281 Muscle weakness (generalized): Secondary | ICD-10-CM

## 2020-07-16 NOTE — Patient Instructions (Signed)
Access Code: [email or web address] URL: [email or web address] Date: 07/16/2020 Prepared by: Daleen Bo  Exercises Standing Isometric Shoulder Internal Rotation at Doorway - 3 x daily - 7 x weekly - 1 sets - 10 reps - 3 hold Standing Isometric Shoulder External Rotation with Doorway - 3 x daily - 7 x weekly - 1 sets - 10 reps - 3 hold Sidelying Shoulder External Rotation - 1 x daily - 7 x weekly - 2 sets - 10 reps Wall Push Up - 1 x daily - 7 x weekly - 2 sets - 10 reps
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
## 2020-07-21 ENCOUNTER — Other Ambulatory Visit: Payer: Self-pay

## 2020-07-21 ENCOUNTER — Ambulatory Visit: Payer: Medicare Other | Admitting: Physical Therapy

## 2020-07-21 ENCOUNTER — Encounter: Payer: Self-pay | Admitting: Physical Therapy

## 2020-07-21 DIAGNOSIS — M25512 Pain in left shoulder: Secondary | ICD-10-CM

## 2020-07-21 DIAGNOSIS — M6281 Muscle weakness (generalized): Secondary | ICD-10-CM | POA: Diagnosis not present
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
## 2020-07-23 ENCOUNTER — Other Ambulatory Visit: Payer: Self-pay

## 2020-07-23 ENCOUNTER — Ambulatory Visit: Payer: Medicare Other | Admitting: Physical Therapy

## 2020-07-23 ENCOUNTER — Encounter: Payer: Self-pay | Admitting: Physical Therapy

## 2020-07-23 DIAGNOSIS — M6281 Muscle weakness (generalized): Secondary | ICD-10-CM | POA: Diagnosis not present

## 2020-07-23 DIAGNOSIS — M25512 Pain in left shoulder: Secondary | ICD-10-CM | POA: Diagnosis not present
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
## 2020-07-23 NOTE — Patient Instructions (Signed)
Access Code: [email or web address] URL: [email or web address] Date: 07/23/2020 Prepared by: Daleen Bo  Exercises Standing Isometric Shoulder Internal Rotation at Doorway - 3 x daily - 7 x weekly - 1 sets - 10 reps - 3 hold Standing Isometric Shoulder External Rotation with Doorway - 3 x daily - 7 x weekly - 1 sets - 10 reps - 3 hold Sidelying Shoulder External Rotation - 1 x daily - 7 x weekly - 2 sets - 10 reps Wall Push Up - 1 x daily - 7 x weekly - 2 sets - 10 reps Scaption Wall Slide with Towel - 1 x daily - 7 x weekly - 2 sets - 10 reps

## 2020-07-28 ENCOUNTER — Other Ambulatory Visit: Payer: Self-pay

## 2020-07-28 ENCOUNTER — Encounter: Payer: Self-pay | Admitting: Physical Therapy

## 2020-07-28 ENCOUNTER — Ambulatory Visit: Payer: Medicare Other | Admitting: Physical Therapy

## 2020-07-28 DIAGNOSIS — M6281 Muscle weakness (generalized): Secondary | ICD-10-CM | POA: Diagnosis not present

## 2020-07-28 DIAGNOSIS — M25512 Pain in left shoulder: Secondary | ICD-10-CM | POA: Diagnosis not present
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
## 2020-07-28 NOTE — Patient Instructions (Signed)
Access Code: [email or web address] URL: [email or web address] Date: 07/28/2020 Prepared by: Daleen Bo  Exercises Standing Isometric Shoulder Internal Rotation at Doorway - 3 x daily - 7 x weekly - 1 sets - 10 reps - 3 hold Standing Isometric Shoulder External Rotation with Doorway - 3 x daily - 7 x weekly - 1 sets - 10 reps - 3 hold Sidelying Shoulder External Rotation - 1 x daily - 7 x weekly - 2 sets - 10 reps Wall Push Up - 1 x daily - 7 x weekly - 2 sets - 10 reps Scaption Wall Slide with Towel - 1 x daily - 7 x weekly - 2 sets - 10 reps Standing Shoulder Abduction Slides at Wall - 1 x daily - 7 x weekly - 2 sets - 10 reps

## 2020-07-30 ENCOUNTER — Ambulatory Visit: Payer: Medicare Other | Admitting: Physical Therapy

## 2020-07-30 ENCOUNTER — Encounter: Payer: Self-pay | Admitting: Physical Therapy

## 2020-07-30 ENCOUNTER — Other Ambulatory Visit: Payer: Self-pay

## 2020-07-30 DIAGNOSIS — M6281 Muscle weakness (generalized): Secondary | ICD-10-CM | POA: Diagnosis not present

## 2020-07-30 DIAGNOSIS — M25512 Pain in left shoulder: Secondary | ICD-10-CM | POA: Diagnosis not present
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
## 2020-08-04 ENCOUNTER — Encounter: Payer: Self-pay | Admitting: Physical Therapy

## 2020-08-04 ENCOUNTER — Other Ambulatory Visit: Payer: Self-pay

## 2020-08-04 ENCOUNTER — Ambulatory Visit: Payer: Medicare Other | Admitting: Physical Therapy

## 2020-08-04 DIAGNOSIS — M25512 Pain in left shoulder: Secondary | ICD-10-CM | POA: Diagnosis not present

## 2020-08-04 DIAGNOSIS — M6281 Muscle weakness (generalized): Secondary | ICD-10-CM | POA: Diagnosis not present
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
## 2020-08-04 NOTE — Patient Instructions (Signed)
Access Code: [email or web address] URL: [email or web address] Date: 08/04/2020 Prepared by: Daleen Bo  Exercises Standing Isometric Shoulder Internal Rotation at Doorway - 3 x daily - 7 x weekly - 1 sets - 10 reps - 3 hold Standing Isometric Shoulder External Rotation with Doorway - 3 x daily - 7 x weekly - 1 sets - 10 reps - 3 hold Sidelying Shoulder External Rotation - 1 x daily - 7 x weekly - 2 sets - 10 reps Wall Push Up - 1 x daily - 7 x weekly - 2 sets - 10 reps Scaption Wall Slide with Towel - 1 x daily - 7 x weekly - 2 sets - 10 reps Standing Shoulder Abduction Slides at Wall - 1 x daily - 7 x weekly - 2 sets - 10 reps

## 2020-08-06 ENCOUNTER — Encounter: Payer: Self-pay | Admitting: Physical Therapy

## 2020-08-06 ENCOUNTER — Ambulatory Visit: Payer: Medicare Other | Admitting: Physical Therapy

## 2020-08-06 ENCOUNTER — Other Ambulatory Visit: Payer: Self-pay

## 2020-08-06 DIAGNOSIS — M25512 Pain in left shoulder: Secondary | ICD-10-CM

## 2020-08-06 DIAGNOSIS — M6281 Muscle weakness (generalized): Secondary | ICD-10-CM
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
## 2020-08-11 ENCOUNTER — Ambulatory Visit: Payer: Medicare Other | Admitting: Physical Therapy

## 2020-08-11 ENCOUNTER — Other Ambulatory Visit: Payer: Self-pay

## 2020-08-11 ENCOUNTER — Encounter: Payer: Self-pay | Admitting: Physical Therapy

## 2020-08-11 DIAGNOSIS — M6281 Muscle weakness (generalized): Secondary | ICD-10-CM | POA: Diagnosis not present

## 2020-08-11 DIAGNOSIS — M25512 Pain in left shoulder: Secondary | ICD-10-CM | POA: Diagnosis not present
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
## 2020-08-13 ENCOUNTER — Encounter: Payer: Self-pay | Admitting: Physical Therapy

## 2020-08-13 ENCOUNTER — Ambulatory Visit: Payer: Medicare Other | Admitting: Physical Therapy

## 2020-08-13 DIAGNOSIS — M6281 Muscle weakness (generalized): Secondary | ICD-10-CM | POA: Diagnosis not present

## 2020-08-13 DIAGNOSIS — M25512 Pain in left shoulder: Secondary | ICD-10-CM | POA: Diagnosis not present
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
## 2020-08-16 ENCOUNTER — Encounter: Payer: Self-pay | Admitting: Family Medicine

## 2020-08-18 ENCOUNTER — Other Ambulatory Visit: Payer: Self-pay

## 2020-08-18 ENCOUNTER — Encounter: Payer: Self-pay | Admitting: Physical Therapy

## 2020-08-18 ENCOUNTER — Ambulatory Visit: Payer: Medicare Other | Admitting: Family Medicine

## 2020-08-18 ENCOUNTER — Ambulatory Visit: Payer: Medicare Other | Admitting: Physical Therapy

## 2020-08-18 DIAGNOSIS — M25512 Pain in left shoulder: Secondary | ICD-10-CM

## 2020-08-18 DIAGNOSIS — M6281 Muscle weakness (generalized): Secondary | ICD-10-CM
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
## 2020-08-18 NOTE — Progress Notes (Signed)
   I, Wendy Poet, LAT, ATC, am serving as scribe for Dr. Lynne Leader.  Andrea [name] is a 70 y.o. female who presents to [place] [facility] today for f/u L shoulder pain. Pt was last seen by Dr. Georgina Snell on 07/08/20 and was referred to PT of which she's completed 12 visits. Today, pt reports that she has had relatively no change in her symptoms despite a large number of PT sessions.  She is con't to have radiating pain to her L elbow.  She con't to have pain w/ L shoulder ext and w/ L sidelying.  Dx imaging: 07/08/20 L shoulder XR  Pertinent review of systems: No fevers or chills  Relevant historical information: History stroke   Exam:  BP 124/68 (BP Location: Right Arm, Patient Position: Sitting, Cuff Size: Normal)   Pulse 94   Ht 5\' 6"  (1.676 m)   Wt 177 lb 12.8 oz (80.6 kg)   SpO2 98%   BMI 28.70 kg/m  General: Well Developed, well nourished, and in no acute distress.   MSK: Left shoulder normal-appearing Normal motion to abduction.  Pain and slight limited motion to internal rotation and external rotation. Strength intact. Positive Hawkins and Neer's test positive empty can test. Negative Yergason's and speeds test.    Lab and Radiology Results  EXAM: LEFT SHOULDER - 2+ VIEW  COMPARISON:  None.  FINDINGS: Three view radiograph left shoulder demonstrates normal alignment. No fracture or dislocation. At least mild glenohumeral degenerative arthritis with small osteophyte formation. Acromioclavicular joint space is not well profiled. Limited evaluation of the left apex is unremarkable.  IMPRESSION: Mild glenohumeral degenerative arthritis.   Electronically Signed   By: Fidela Salisbury MD   On: 07/09/2020 01:05  I, Lynne Leader, personally (independently) visualized and performed the interpretation of the images attached in this note.     Assessment and Plan: 71 y.o. female with left shoulder pain.  Patient has had an excellent trial  of physical therapy but unfortunately has not improved.  Next step is MRI to further evaluate cause of pain and for injection or surgical planning.  Plan for MRI and recheck after MRI to discuss results and potentially proceed with injection or plan for surgery.   PDMP not reviewed this encounter. Orders Placed This Encounter  Procedures  . MR SHOULDER LEFT WO CONTRAST    Standing Status:   Future    Standing Expiration Date:   08/19/2021    Order Specific Question:   What is the patient's sedation requirement?    Answer:   No Sedation    Order Specific Question:   Does the patient have a pacemaker or implanted devices?    Answer:   No    Order Specific Question:   Preferred imaging location?    Answer:   [facility] (table limit-550lbs)   No orders of the defined types were placed in this encounter.    Discussed warning signs or symptoms. Please see discharge instructions. Patient expresses understanding.   The above documentation has been reviewed and is accurate and complete Lynne Leader, M.D.

## 2020-08-19 ENCOUNTER — Encounter: Payer: Self-pay | Admitting: Family Medicine

## 2020-08-19 ENCOUNTER — Ambulatory Visit: Payer: Self-pay

## 2020-08-19 ENCOUNTER — Ambulatory Visit: Payer: Medicare Other | Admitting: Family Medicine

## 2020-08-19 VITALS — BP 124/68 | HR 94 | Ht 66.0 in | Wt 177.8 lb

## 2020-08-19 DIAGNOSIS — M25512 Pain in left shoulder: Secondary | ICD-10-CM | POA: Diagnosis not present

## 2020-08-19 NOTE — Patient Instructions (Signed)
Thank you for coming in today.  You should hear from MRI scheduling within 1 week. If you do not hear please let me know.   Recheck after the MRI.  Anticipate injection vs surgery consultation after the MRI.

## 2020-08-20 ENCOUNTER — Encounter: Payer: Medicare Other | Admitting: Physical Therapy

## 2020-08-22 ENCOUNTER — Ambulatory Visit (INDEPENDENT_AMBULATORY_CARE_PROVIDER_SITE_OTHER): Payer: Medicare Other | Admitting: Adult Health

## 2020-08-22 ENCOUNTER — Other Ambulatory Visit: Payer: Self-pay

## 2020-08-22 VITALS — BP 124/72 | HR 101 | Temp 98.1°F | Ht 66.0 in | Wt 175.8 lb

## 2020-08-22 DIAGNOSIS — R300 Dysuria: Secondary | ICD-10-CM

## 2020-08-22 LAB — POCT URINALYSIS DIPSTICK
Bilirubin, UA: NEGATIVE
Blood, UA: POSITIVE
Glucose, UA: NEGATIVE
Ketones, UA: NEGATIVE
Nitrite, UA: NEGATIVE
Protein, UA: POSITIVE — AB
Spec Grav, UA: 1.01 (ref 1.010–1.025)
Urobilinogen, UA: 0.2 E.U./dL
pH, UA: 6.5 (ref 5.0–8.0)

## 2020-08-22 MED ORDER — AMOXICILLIN-POT CLAVULANATE 875-125 MG PO TABS: 1.0000 | ORAL_TABLET | Freq: Two times a day (BID) | ORAL | 0 refills | Status: AC
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
## 2020-08-24 LAB — URINE CULTURE
MICRO NUMBER:: 11888930
SPECIMEN QUALITY:: ADEQUATE

## 2020-09-03 ENCOUNTER — Other Ambulatory Visit: Payer: Self-pay

## 2020-09-03 ENCOUNTER — Ambulatory Visit
Admission: RE | Admit: 2020-09-03 | Discharge: 2020-09-03 | Disposition: A | Discharge status: Home / Self Care | Payer: Medicare Other | Source: Ambulatory Visit | Attending: Family Medicine | Admitting: Family Medicine

## 2020-09-03 DIAGNOSIS — M19012 Primary osteoarthritis, left shoulder: Secondary | ICD-10-CM | POA: Diagnosis not present

## 2020-09-03 DIAGNOSIS — M25512 Pain in left shoulder: Secondary | ICD-10-CM

## 2020-09-03 DIAGNOSIS — M25412 Effusion, left shoulder: Secondary | ICD-10-CM | POA: Diagnosis not present

## 2020-09-05 NOTE — Progress Notes (Signed)
MRI left shoulder shows rotator cuff tendinitis and some fraying of the rotator cuff tendon without significant tear.  Some shoulder arthritis is present as well and a loose body is present in the shoulder joint.  Please return to clinic to go over the results in full detail and likely proceed with injection.

## 2020-09-10 NOTE — Progress Notes (Signed)
I, Wendy Poet, LAT, ATC, am serving as scribe for Dr. Lynne Leader.  Andrea [name] is a 70 y.o. female who presents to [facility] at [facility] today for f/u L shoulder pain and MRI review. Pt had an unsuccessful trial of PT, completing 12 visits. Pt was last seen by Dr. Georgina Snell on 08/19/20 and advised to proceed to MRI. Today, pt reports no change in her L shoulder pain and symptoms, noting she cannot lay on her L side.  She also reports that her L shoulder will pop and crack at times.  She has mechanical locking of her shoulder with abduction requiring a special maneuver to get her shoulder to unlock and be able to abduct further at times.  Dx imaging: 09/03/20 L shoulder MRI  07/08/20 L shoulder XR  Pertinent review of systems: No fevers or chills  Relevant historical information: Hypertension   Exam:  BP 94/60 (BP Location: Right Arm, Patient Position: Sitting, Cuff Size: Normal)   Pulse 92   Ht 5\' 6"  (1.676 m)   Wt 174 lb 6.4 oz (79.1 kg)   SpO2 96%   BMI 28.15 kg/m  General: Well Developed, well nourished, and in no acute distress.   MSK: Left shoulder normal motion.    Lab and Radiology Results  EXAM: MRI OF THE LEFT SHOULDER WITHOUT CONTRAST  TECHNIQUE: Multiplanar, multisequence MR imaging of the shoulder was performed. No intravenous contrast was administered.  COMPARISON:  X-ray 07/08/2020  FINDINGS: Rotator cuff: Mild infraspinatus tendinosis with small focal area of articular surface fraying of the distal tendon (series 4, image 10). Supraspinatus, subscapularis, and teres minor tendons are intact. No full thickness rotator cuff tear.  Muscles: Preserved bulk and signal intensity of the rotator cuff musculature without edema, atrophy, or fatty infiltration.  Biceps long head:  Intact.  Acromioclavicular Joint: Moderate arthropathy of the AC joint. Trace subacromial-subdeltoid bursal fluid.  Glenohumeral Joint: Mild-moderate  glenohumeral osteoarthritis with chondral thinning and surface irregularity. Small humeral head marginal osteophytes. Small glenohumeral joint effusion with small loose body in the axillary pouch.  Labrum: Grossly intact. Evaluation limited by slightly motion degraded non-arthrographic images. No paralabral cyst.  Bones: No acute fracture. No dislocation. No bone marrow edema. No suspicious marrow replacing bone lesion.  Other: None.  IMPRESSION: 1. Mild infraspinatus tendinosis with small focal area of articular surface fraying of the distal tendon. No full-thickness rotator cuff tear. 2. Mild-to-moderate glenohumeral and moderate AC joint osteoarthritis. 3. Small glenohumeral joint effusion with small loose body in the axillary pouch.   Electronically Signed   By: Davina Poke D.O.   On: 09/04/2020 15:55  I, Lynne Leader, personally (independently) visualized and performed the interpretation of the images attached in this note.    Assessment and Plan: 70 y.o. female with left shoulder pain with mechanical symptoms.  Pain thought to be multifactorial due to glenohumeral DJD, AC DJD, and infraspinatus tendinitis.  However the mechanical locking I think is very likely due to the loose body seen on MRI.  She describes a mechanical locking sensation at times.  Discussed options.  Based on her symptoms surgical consultation is reasonable.  Offered steroid injection prior to surgical consultation.  She would like to proceed directly to surgical consultation.  Recheck back with me as needed.  She has failed trial conservative management with physical therapy. PDMP not reviewed this encounter. Orders Placed This Encounter  Procedures  . Ambulatory referral to Orthopedic Surgery    Referral Priority:  Routine    Referral Type:   Surgical    Referral Reason:   Specialty Services Required    Requested Specialty:   Orthopedic Surgery    Number of Visits Requested:   1   No  orders of the defined types were placed in this encounter.    Discussed warning signs or symptoms. Please see discharge instructions. Patient expresses understanding.   The above documentation has been reviewed and is accurate and complete Lynne Leader, M.D. Total encounter time 20 minutes including face-to-face time with the patient and, reviewing past medical record, and charting on the date of service.   Discussed treatment plan and options.

## 2020-09-11 ENCOUNTER — Other Ambulatory Visit: Payer: Self-pay

## 2020-09-11 ENCOUNTER — Encounter: Payer: Self-pay | Admitting: Family Medicine

## 2020-09-11 ENCOUNTER — Ambulatory Visit: Payer: Medicare Other | Admitting: Family Medicine

## 2020-09-11 VITALS — BP 94/60 | HR 92 | Ht 66.0 in | Wt 174.4 lb

## 2020-09-11 DIAGNOSIS — M25512 Pain in left shoulder: Secondary | ICD-10-CM | POA: Diagnosis not present

## 2020-09-11 NOTE — Patient Instructions (Addendum)
Thank you for coming in today.  You should hear from orthopedics soon about an appointment.   They may want to do an injection. I can do that as can they.  But if surgery is indicated we should do that.   Let me know if you have a problem.   I am here for you.

## 2020-09-24 ENCOUNTER — Ambulatory Visit: Payer: Self-pay

## 2020-09-24 ENCOUNTER — Ambulatory Visit: Payer: Medicare Other | Admitting: Orthopedic Surgery

## 2020-09-24 ENCOUNTER — Telehealth: Payer: Self-pay

## 2020-09-24 ENCOUNTER — Other Ambulatory Visit: Payer: Self-pay

## 2020-09-24 DIAGNOSIS — G8929 Other chronic pain: Secondary | ICD-10-CM | POA: Diagnosis not present

## 2020-09-24 DIAGNOSIS — M25512 Pain in left shoulder: Secondary | ICD-10-CM

## 2020-09-24 NOTE — Telephone Encounter (Signed)
Left shoulder injection needed for pt today.

## 2020-09-24 NOTE — Progress Notes (Signed)
   Andrea [name] - 70 y.o. female MRN [id number]  Date of birth: January 13, 1951  Office Visit Note: Visit Date: 09/24/2020 PCP: Eulas Post, MD Referred by: Gregor Hams, MD  Subjective: Chief Complaint  Patient presents with   Left Shoulder - Pain   HPI:  Andrea [name] is a 70 y.o. female who comes in today at the request of Dr. Anderson Malta for planned Left anesthetic glenohumeral joint arthrogram with fluoroscopic guidance.  The patient has failed conservative care including home exercise, medications, time and activity modification.  This injection will be diagnostic and hopefully therapeutic.  Please see requesting physician notes for further details and justification.   ROS Otherwise per HPI.  Assessment & Plan: Visit Diagnoses:    ICD-10-CM   1. Chronic left shoulder pain  M25.512 XR C-ARM NO REPORT   G89.29 Large Joint Inj: L glenohumeral      Plan: No additional findings.   Meds & Orders: No orders of the defined types were placed in this encounter.   Orders Placed This Encounter  Procedures   Large Joint Inj: L glenohumeral   XR C-ARM NO REPORT    Follow-up: Anderson Malta, MD  Procedures: Large Joint Inj: L glenohumeral on 09/24/2020 2:34 PM Indications: pain and diagnostic evaluation Details: 22 G 3.5 in needle, fluoroscopy-guided anteromedial approach  Arthrogram: No  Medications: 3 mL bupivacaine 0.5 %; 60 mg triamcinolone acetonide 40 MG/ML Outcome: tolerated well, no immediate complications  There was excellent flow of contrast producing a partial arthrogram of the glenohumeral joint. The patient did have relief of symptoms during the anesthetic phase of the injection. Significant increase in painless ROM. Procedure, treatment alternatives, risks and benefits explained, specific risks discussed. Consent was given by the patient. Immediately prior to procedure a time out was called to verify the correct patient, procedure, equipment, support staff and  site/side marked as required. Patient was prepped and draped in the usual sterile fashion.         Clinical History: No specialty comments available.     Objective:  VS:  HT:    WT:   BMI:     BP:   HR: bpm  TEMP: ( )  RESP:  Physical Exam   Imaging: No results found.

## 2020-09-26 ENCOUNTER — Encounter: Payer: Self-pay | Admitting: Orthopedic Surgery

## 2020-09-26 MED ORDER — TRIAMCINOLONE ACETONIDE 40 MG/ML IJ SUSP
60.0000 mg | INTRAMUSCULAR | Status: AC | PRN
  Administered 2020-09-24: 60 mg via INTRA_ARTICULAR

## 2020-09-26 MED ORDER — BUPIVACAINE HCL 0.5 % IJ SOLN
3.0000 mL | INTRAMUSCULAR | Status: AC | PRN
  Administered 2020-09-24: 3 mL via INTRA_ARTICULAR
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
## 2020-10-06 ENCOUNTER — Encounter: Payer: Self-pay | Admitting: Family Medicine

## 2020-10-06 MED ORDER — GABAPENTIN 600 MG PO TABS: ORAL_TABLET | ORAL | 0 refills | Status: DC

## 2020-10-14 ENCOUNTER — Ambulatory Visit (INDEPENDENT_AMBULATORY_CARE_PROVIDER_SITE_OTHER): Payer: Medicare Other | Admitting: Family Medicine

## 2020-10-14 ENCOUNTER — Encounter: Payer: Self-pay | Admitting: Family Medicine

## 2020-10-14 ENCOUNTER — Other Ambulatory Visit: Payer: Self-pay

## 2020-10-14 VITALS — BP 120/76 | HR 92 | Temp 98.0°F | Ht 66.0 in | Wt 172.9 lb

## 2020-10-14 DIAGNOSIS — M546 Pain in thoracic spine: Secondary | ICD-10-CM
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
## 2020-10-14 NOTE — Patient Instructions (Signed)
Suspect chest wall strain or contusion  Continue Tylenol as needed for pain.Marland Kitchen

## 2020-10-16 ENCOUNTER — Other Ambulatory Visit: Payer: Self-pay

## 2020-10-16 ENCOUNTER — Encounter: Payer: Self-pay | Admitting: Family Medicine

## 2020-10-16 MED ORDER — GABAPENTIN 600 MG PO TABS: ORAL_TABLET | ORAL | 0 refills | Status: DC

## 2020-10-21 ENCOUNTER — Encounter: Payer: Self-pay | Admitting: Family Medicine

## 2020-10-21 DIAGNOSIS — M546 Pain in thoracic spine: Secondary | ICD-10-CM

## 2020-10-21 MED ORDER — TRAMADOL HCL 50 MG PO TABS: 50.0000 mg | ORAL_TABLET | Freq: Four times a day (QID) | ORAL | 0 refills | Status: AC | PRN

## 2020-10-21 NOTE — Telephone Encounter (Signed)
Let her know I placed orders for thoracic and left rib films.   She can get these at [facility] in basement M-F 8 to 5 pm.

## 2020-10-21 NOTE — Addendum Note (Signed)
Addended by: Eulas Post on: 10/21/2020 04:15 PM   Modules accepted: Orders

## 2020-10-21 NOTE — Telephone Encounter (Signed)
Sent in some limited ultrasound to [facility] [address]

## 2020-10-22 ENCOUNTER — Ambulatory Visit (INDEPENDENT_AMBULATORY_CARE_PROVIDER_SITE_OTHER)
Admission: RE | Admit: 2020-10-22 | Discharge: 2020-10-22 | Disposition: A | Discharge status: Home / Self Care | Payer: Medicare Other | Source: Ambulatory Visit | Attending: Family Medicine | Admitting: Family Medicine

## 2020-10-22 ENCOUNTER — Other Ambulatory Visit: Payer: Self-pay

## 2020-10-22 DIAGNOSIS — M545 Low back pain, unspecified: Secondary | ICD-10-CM | POA: Diagnosis not present

## 2020-10-22 DIAGNOSIS — M546 Pain in thoracic spine: Secondary | ICD-10-CM

## 2020-10-22 DIAGNOSIS — R0781 Pleurodynia: Secondary | ICD-10-CM | POA: Diagnosis not present

## 2020-10-27 ENCOUNTER — Encounter: Payer: Self-pay | Admitting: Family Medicine

## 2020-11-17 ENCOUNTER — Other Ambulatory Visit: Payer: Self-pay

## 2020-11-17 ENCOUNTER — Encounter (HOSPITAL_BASED_OUTPATIENT_CLINIC_OR_DEPARTMENT_OTHER): Payer: Self-pay

## 2020-11-17 ENCOUNTER — Emergency Department (HOSPITAL_BASED_OUTPATIENT_CLINIC_OR_DEPARTMENT_OTHER): Payer: Medicare Other

## 2020-11-17 ENCOUNTER — Emergency Department (HOSPITAL_BASED_OUTPATIENT_CLINIC_OR_DEPARTMENT_OTHER): Payer: Medicare Other | Admitting: Radiology

## 2020-11-17 ENCOUNTER — Emergency Department (HOSPITAL_BASED_OUTPATIENT_CLINIC_OR_DEPARTMENT_OTHER)
Admission: EM | Admit: 2020-11-17 | Discharge: 2020-11-17 | Disposition: A | Discharge status: Home / Self Care | Payer: Medicare Other | Attending: Emergency Medicine | Admitting: Emergency Medicine

## 2020-11-17 DIAGNOSIS — Z7982 Long term (current) use of aspirin: Secondary | ICD-10-CM | POA: Diagnosis not present

## 2020-11-17 DIAGNOSIS — R0781 Pleurodynia: Secondary | ICD-10-CM | POA: Insufficient documentation

## 2020-11-17 DIAGNOSIS — R109 Unspecified abdominal pain: Secondary | ICD-10-CM | POA: Insufficient documentation

## 2020-11-17 DIAGNOSIS — Z79899 Other long term (current) drug therapy: Secondary | ICD-10-CM | POA: Diagnosis not present

## 2020-11-17 DIAGNOSIS — I10 Essential (primary) hypertension: Secondary | ICD-10-CM | POA: Diagnosis not present

## 2020-11-17 DIAGNOSIS — R079 Chest pain, unspecified: Secondary | ICD-10-CM | POA: Diagnosis not present

## 2020-11-17 DIAGNOSIS — K5730 Diverticulosis of large intestine without perforation or abscess without bleeding: Secondary | ICD-10-CM | POA: Diagnosis not present

## 2020-11-17 DIAGNOSIS — K219 Gastro-esophageal reflux disease without esophagitis: Secondary | ICD-10-CM | POA: Insufficient documentation

## 2020-11-17 DIAGNOSIS — W01198A Fall on same level from slipping, tripping and stumbling with subsequent striking against other object, initial encounter: Secondary | ICD-10-CM | POA: Diagnosis not present

## 2020-11-17 LAB — COMPREHENSIVE METABOLIC PANEL
ALT: 13 U/L (ref 0–44)
AST: 16 U/L (ref 15–41)
Albumin: 4.6 g/dL (ref 3.5–5.0)
Alkaline Phosphatase: 85 U/L (ref 38–126)
Anion gap: 9 (ref 5–15)
BUN: 14 mg/dL (ref 8–23)
CO2: 29 mmol/L (ref 22–32)
Calcium: 9.5 mg/dL (ref 8.9–10.3)
Chloride: 104 mmol/L (ref 98–111)
Creatinine, Ser: 1.02 mg/dL — ABNORMAL HIGH (ref 0.44–1.00)
GFR, Estimated: 60 mL/min — ABNORMAL LOW (ref >60)
Glucose, Bld: 105 mg/dL — ABNORMAL HIGH (ref 70–99)
Potassium: 3.9 mmol/L (ref 3.5–5.1)
Sodium: 142 mmol/L (ref 135–145)
Total Bilirubin: 0.5 mg/dL (ref 0.3–1.2)
Total Protein: 6.7 g/dL (ref 6.5–8.1)

## 2020-11-17 LAB — CBC WITH DIFFERENTIAL/PLATELET
Abs Immature Granulocytes: 0.01 10*3/uL (ref 0.00–0.07)
Basophils Absolute: 0.1 10*3/uL (ref 0.0–0.1)
Basophils Relative: 1 %
Eosinophils Absolute: 0.1 10*3/uL (ref 0.0–0.5)
Eosinophils Relative: 2 %
HCT: 39.6 % (ref 36.0–46.0)
Hemoglobin: 13.4 g/dL (ref 12.0–15.0)
Immature Granulocytes: 0 %
Lymphocytes Relative: 16 %
Lymphs Abs: 1.4 10*3/uL (ref 0.7–4.0)
MCH: 29.5 pg (ref 26.0–34.0)
MCHC: 33.8 g/dL (ref 30.0–36.0)
MCV: 87.2 fL (ref 80.0–100.0)
Monocytes Absolute: 0.7 10*3/uL (ref 0.1–1.0)
Monocytes Relative: 7 %
Neutro Abs: 6.8 10*3/uL (ref 1.7–7.7)
Neutrophils Relative %: 74 %
Platelets: 212 10*3/uL (ref 150–400)
RBC: 4.54 MIL/uL (ref 3.87–5.11)
RDW: 12.1 % (ref 11.5–15.5)
WBC: 9.1 10*3/uL (ref 4.0–10.5)
nRBC: 0 % (ref 0.0–0.2)

## 2020-11-17 LAB — URINALYSIS, ROUTINE W REFLEX MICROSCOPIC
Bilirubin Urine: NEGATIVE
Glucose, UA: NEGATIVE mg/dL
Ketones, ur: NEGATIVE mg/dL
Nitrite: NEGATIVE
Specific Gravity, Urine: 1.019 (ref 1.005–1.030)
pH: 5.5 (ref 5.0–8.0)

## 2020-11-17 MED ORDER — OXYCODONE-ACETAMINOPHEN 5-325 MG PO TABS
1.0000 | ORAL_TABLET | Freq: Once | ORAL | Status: AC
Start: 2020-11-17 — End: 2020-11-17
  Administered 2020-11-17: 1 via ORAL
  Filled 2020-11-17: qty 1

## 2020-11-17 MED ORDER — OXYCODONE-ACETAMINOPHEN 5-325 MG PO TABS
1.0000 | ORAL_TABLET | Freq: Once | ORAL | Status: AC
  Administered 2020-11-17: 1 via ORAL
  Filled 2020-11-17: qty 1

## 2020-11-17 MED ORDER — OXYCODONE-ACETAMINOPHEN 5-325 MG PO TABS: 1.0000 | ORAL_TABLET | Freq: Four times a day (QID) | ORAL | 0 refills | Status: DC | PRN

## 2020-11-17 MED ORDER — CEPHALEXIN 500 MG PO CAPS: 500.0000 mg | ORAL_CAPSULE | Freq: Two times a day (BID) | ORAL | 0 refills | Status: DC
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
## 2020-11-17 NOTE — Discharge Instructions (Addendum)
You were seen in the emergency department for right-sided flank pain.  You had a chest x-ray along with right ribs, CAT scan of your abdomen and pelvis, lab work and urinalysis that did not show an obvious explanation for your symptoms.  This is possibly a pulled muscle.  We are prescribing you some pain medicine.  Your urine also showed possible signs of infection so we are prescribing you an antibiotic.  Please contact your primary care doctor for close follow-up.  Return to the emergency department if any worsening or concerning symptoms

## 2020-11-17 NOTE — ED Triage Notes (Signed)
Patient here POV from Home for Right Rib Cage Pain.  Patient bent down today at 0900 to pick something up and Patient began having Pain that begins at Right Mid Back that radiates to Right Rib Cage. Muscle Relaxer was not effective at Home.  No Fall today. Patient did Fall on Friday. BIB Wheelchair. GCS 15.

## 2020-11-19 ENCOUNTER — Ambulatory Visit: Payer: Medicare Other | Admitting: Orthopedic Surgery

## 2020-11-20 ENCOUNTER — Other Ambulatory Visit: Payer: Self-pay

## 2020-11-21 ENCOUNTER — Ambulatory Visit (INDEPENDENT_AMBULATORY_CARE_PROVIDER_SITE_OTHER): Payer: Medicare Other | Admitting: Family Medicine

## 2020-11-21 VITALS — BP 120/60 | HR 90 | Temp 97.6°F | Wt 174.6 lb

## 2020-11-21 DIAGNOSIS — R0789 Other chest pain: Secondary | ICD-10-CM

## 2020-11-21 MED ORDER — OXYCODONE-ACETAMINOPHEN 5-325 MG PO TABS: 1.0000 | ORAL_TABLET | Freq: Four times a day (QID) | ORAL | 0 refills | Status: DC | PRN

## 2020-11-21 NOTE — Patient Instructions (Signed)
Continue to use conservative treatments such as heat or ice  Could try Ace wrap but would need to be sure to take frequent deep breaths.
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
## 2020-12-07 ENCOUNTER — Encounter: Payer: Self-pay | Admitting: Family Medicine

## 2020-12-08 MED ORDER — CYCLOBENZAPRINE HCL 10 MG PO TABS: 10.0000 mg | ORAL_TABLET | Freq: Every day | ORAL | 1 refills | Status: DC

## 2020-12-08 MED ORDER — BUTALBITAL-APAP-CAFFEINE 50-325-40 MG PO TABS: 1.0000 | ORAL_TABLET | Freq: Four times a day (QID) | ORAL | 0 refills | Status: DC | PRN

## 2020-12-09 ENCOUNTER — Encounter: Payer: Self-pay | Admitting: Family Medicine

## 2020-12-09 ENCOUNTER — Other Ambulatory Visit: Payer: Self-pay | Admitting: Family Medicine

## 2020-12-09 MED ORDER — CYCLOBENZAPRINE HCL 10 MG PO TABS: 10.0000 mg | ORAL_TABLET | Freq: Every day | ORAL | 1 refills | Status: DC

## 2020-12-31 ENCOUNTER — Encounter: Payer: Self-pay | Admitting: Family Medicine

## 2020-12-31 ENCOUNTER — Ambulatory Visit (INDEPENDENT_AMBULATORY_CARE_PROVIDER_SITE_OTHER): Payer: Medicare Other | Admitting: Family Medicine

## 2020-12-31 ENCOUNTER — Other Ambulatory Visit: Payer: Self-pay

## 2020-12-31 VITALS — BP 120/70 | HR 90 | Temp 98.5°F | Ht 66.0 in | Wt 172.4 lb

## 2020-12-31 DIAGNOSIS — Z23 Encounter for immunization: Secondary | ICD-10-CM

## 2020-12-31 DIAGNOSIS — E785 Hyperlipidemia, unspecified: Secondary | ICD-10-CM | POA: Diagnosis not present

## 2020-12-31 DIAGNOSIS — E538 Deficiency of other specified B group vitamins: Secondary | ICD-10-CM | POA: Diagnosis not present

## 2020-12-31 DIAGNOSIS — I10 Essential (primary) hypertension: Secondary | ICD-10-CM

## 2020-12-31 LAB — VITAMIN B12: Vitamin B-12: >1550 pg/mL — ABNORMAL HIGH (ref 211–911)

## 2020-12-31 LAB — LIPID PANEL
Cholesterol: 125 mg/dL (ref 0–200)
HDL: 50.7 mg/dL (ref >39.00)
LDL Cholesterol: 51 mg/dL (ref 0–99)
NonHDL: 73.92
Total CHOL/HDL Ratio: 2
Triglycerides: 113 mg/dL (ref 0.0–149.0)
VLDL: 22.6 mg/dL (ref 0.0–40.0)
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
## 2021-01-07 DIAGNOSIS — H02401 Unspecified ptosis of right eyelid: Secondary | ICD-10-CM | POA: Diagnosis not present

## 2021-01-07 DIAGNOSIS — H04123 Dry eye syndrome of bilateral lacrimal glands: Secondary | ICD-10-CM | POA: Diagnosis not present

## 2021-01-07 DIAGNOSIS — H35371 Puckering of macula, right eye: Secondary | ICD-10-CM | POA: Diagnosis not present

## 2021-01-13 ENCOUNTER — Encounter: Payer: Self-pay | Admitting: Family Medicine

## 2021-01-13 DIAGNOSIS — H02403 Unspecified ptosis of bilateral eyelids: Secondary | ICD-10-CM | POA: Diagnosis not present

## 2021-01-13 DIAGNOSIS — H02831 Dermatochalasis of right upper eyelid: Secondary | ICD-10-CM | POA: Diagnosis not present

## 2021-01-13 DIAGNOSIS — H02132 Senile ectropion of right lower eyelid: Secondary | ICD-10-CM | POA: Diagnosis not present

## 2021-01-13 DIAGNOSIS — H57813 Brow ptosis, bilateral: Secondary | ICD-10-CM | POA: Diagnosis not present

## 2021-01-13 MED ORDER — GABAPENTIN 600 MG PO TABS: ORAL_TABLET | ORAL | 0 refills | Status: DC

## 2021-01-15 ENCOUNTER — Encounter: Payer: Self-pay | Admitting: Family Medicine

## 2021-01-25 ENCOUNTER — Encounter: Payer: Self-pay | Admitting: Family Medicine

## 2021-01-26 ENCOUNTER — Telehealth (INDEPENDENT_AMBULATORY_CARE_PROVIDER_SITE_OTHER): Payer: Medicare Other | Admitting: Family Medicine

## 2021-01-26 ENCOUNTER — Encounter: Payer: Self-pay | Admitting: Family Medicine

## 2021-01-26 DIAGNOSIS — U071 COVID-19: Secondary | ICD-10-CM | POA: Diagnosis not present

## 2021-01-26 DIAGNOSIS — Z20822 Contact with and (suspected) exposure to covid-19: Secondary | ICD-10-CM | POA: Diagnosis not present
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
## 2021-02-11 ENCOUNTER — Encounter: Payer: Self-pay | Admitting: Family Medicine

## 2021-02-11 MED ORDER — BUTALBITAL-APAP-CAFFEINE 50-325-40 MG PO TABS
1.0000 | ORAL_TABLET | Freq: Four times a day (QID) | ORAL | 0 refills | Status: DC | PRN
Start: 2021-02-11 — End: 2021-02-18

## 2021-02-18 ENCOUNTER — Other Ambulatory Visit: Payer: Self-pay | Admitting: Family Medicine

## 2021-02-18 DIAGNOSIS — H02135 Senile ectropion of left lower eyelid: Secondary | ICD-10-CM | POA: Diagnosis not present

## 2021-02-18 DIAGNOSIS — H02834 Dermatochalasis of left upper eyelid: Secondary | ICD-10-CM | POA: Diagnosis not present

## 2021-02-18 DIAGNOSIS — H02132 Senile ectropion of right lower eyelid: Secondary | ICD-10-CM | POA: Diagnosis not present

## 2021-02-18 DIAGNOSIS — H02831 Dermatochalasis of right upper eyelid: Secondary | ICD-10-CM | POA: Diagnosis not present

## 2021-02-18 MED ORDER — BUTALBITAL-APAP-CAFFEINE 50-325-40 MG PO TABS: 1.0000 | ORAL_TABLET | Freq: Four times a day (QID) | ORAL | 0 refills | Status: DC | PRN

## 2021-02-18 NOTE — Addendum Note (Signed)
Addended by: Eulas Post on: 02/18/2021 01:00 PM   Modules accepted: Orders

## 2021-02-18 NOTE — Telephone Encounter (Signed)
Please advise, I did not realize this was a controlled Rx and the prescription was printed.   Last filled 12/08/2020 Last OV 12/31/2020 Ok to fill?

## 2021-03-20 ENCOUNTER — Ambulatory Visit (INDEPENDENT_AMBULATORY_CARE_PROVIDER_SITE_OTHER): Payer: Medicare Other

## 2021-03-20 VITALS — BP 110/62 | Temp 98.7°F | Ht 66.0 in | Wt 171.8 lb

## 2021-03-20 DIAGNOSIS — Z0000 Encounter for general adult medical examination without abnormal findings: Secondary | ICD-10-CM
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
## 2021-03-20 NOTE — Patient Instructions (Addendum)
[name] , Thank you for taking time to come for your Medicare Wellness Visit. I appreciate your ongoing commitment to your health goals. Please review the following plan we discussed and let me know if I can assist you in the future.   These are the goals we discussed:  Goals      Patient Stated     To maintain your health Continue to walk your dog.         This is a list of the screening recommended for you and due dates:  Health Maintenance  Topic Date Due   COVID-19 Vaccine (4 - Booster) 04/05/2021*   Zoster (Shingles) Vaccine (1 of 2) 06/18/2021*   Mammogram  10/14/2021*   Tetanus Vaccine  03/20/2022*   Colon Cancer Screening  05/09/2028   Pneumonia Vaccine  Completed   Flu Shot  Completed   DEXA scan (bone density measurement)  Completed   Hepatitis C Screening: USPSTF Recommendation to screen - Ages 60-79 yo.  Completed   HPV Vaccine  Aged Out  *Topic was postponed. The date shown is not the original due date.    Advanced directives: Yes  Conditions/risks identified: NoneOpioid Pain Medicine Management Opioids are powerful medicines that are used to treat moderate to severe pain. When used for short periods of time, they can help you to: Sleep better. Do better in physical or occupational therapy. Feel better in the first few days after an injury. Recover from surgery. Opioids should be taken with the supervision of a trained health care provider. They should be taken for the shortest period of time possible. This is because opioids can be addictive, and the longer you take opioids, the greater your risk of addiction. This addiction can also be called opioid use disorder. What are the risks? Using opioid pain medicines for longer than 3 days increases your risk of side effects. Side effects include: Constipation. Nausea and vomiting. Breathing difficulties (respiratory depression). Drowsiness. Confusion. Opioid use disorder. Itching. Taking opioid pain  medicine for a long period of time can affect your ability to do daily tasks. It also puts you at risk for: Motor vehicle crashes. Depression. Suicide. Heart attack. Overdose, which can be life-threatening. What is a pain treatment plan? A pain treatment plan is an agreement between you and your health care provider. Pain is unique to each person, and treatments vary depending on your condition. To manage your pain, you and your health care provider need to work together. To help you do this: Discuss the goals of your treatment, including how much pain you might expect to have and how you will manage the pain. Review the risks and benefits of taking opioid medicines. Remember that a good treatment plan uses more than one approach and minimizes the chance of side effects. Be honest about the amount of medicines you take and about any drug or alcohol use. Get pain medicine prescriptions from only one health care provider. Pain can be managed with many types of alternative treatments. Ask your health care provider to refer you to one or more specialists who can help you manage pain through: Physical or occupational therapy. Counseling (cognitive behavioral therapy). Good nutrition. Biofeedback. Massage. Meditation. Non-opioid medicine. Following a gentle exercise program. How to use opioid pain medicine Taking medicine Take your pain medicine exactly as told by your health care provider. Take it only when you need it. If your pain gets less severe, you may take less than your prescribed dose if your health care  provider approves. If you are not having pain, do nottake pain medicine unless your health care provider tells you to take it. If your pain is severe, do nottry to treat it yourself by taking more pills than instructed on your prescription. Contact your health care provider for help. Write down the times when you take your pain medicine. It is easy to become confused while on pain  medicine. Writing the time can help you avoid overdose. Take other over-the-counter or prescription medicines only as told by your health care provider. Keeping yourself and others safe  While you are taking opioid pain medicine: Do not drive, use machinery, or power tools. Do not sign legal documents. Do not drink alcohol. Do not take sleeping pills. Do not supervise children by yourself. Do not do activities that require climbing or being in high places. Do not go to a lake, river, ocean, spa, or swimming pool. Do not share your pain medicine with anyone. Keep pain medicine in a locked cabinet or in a secure area where pets and children cannot reach it. Stopping your use of opioids If you have been taking opioid medicine for more than a few weeks, you may need to slowly decrease (taper) how much you take until you stop completely. Tapering your use of opioids can decrease your risk of symptoms of withdrawal, such as: Pain and cramping in the abdomen. Nausea. Sweating. Sleepiness. Restlessness. Uncontrollable shaking (tremors). Cravings for the medicine. Do not attempt to taper your use of opioids on your own. Talk with your health care provider about how to do this. Your health care provider may prescribe a step-down schedule based on how much medicine you are taking and how long you have been taking it. Getting rid of leftover pills Do not save any leftover pills. Get rid of leftover pills safely by: Taking the medicine to a prescription take-back program. This is usually offered by the county or law enforcement. Bringing them to a pharmacy that has a drug disposal container. Flushing them down the toilet. Check the label or package insert of your medicine to see whether this is safe to do. Throwing them out in the trash. Check the label or package insert of your medicine to see whether this is safe to do. If it is safe to throw it out, remove the medicine from the original  container, put it into a sealable bag or container, and mix it with used coffee grounds, food scraps, dirt, or cat litter before putting it in the trash. Follow these instructions at home: Activity Do exercises as told by your health care provider. Avoid activities that make your pain worse. Return to your normal activities as told by your health care provider. Ask your health care provider what activities are safe for you. General instructions You may need to take these actions to prevent or treat constipation: Drink enough fluid to keep your urine pale yellow. Take over-the-counter or prescription medicines. Eat foods that are high in fiber, such as beans, whole grains, and fresh fruits and vegetables. Limit foods that are high in fat and processed sugars, such as fried or sweet foods. Keep all follow-up visits. This is important. Where to find support If you have been taking opioids for a long time, you may benefit from receiving support for quitting from a local support group or counselor. Ask your health care provider for a referral to these resources in your area. Where to find more information Centers for Disease Control and Prevention (CDC):  http://www.wolf.info/ [place] Food and Drug Administration (FDA): [place] Get help right away if: You may have taken too much of an opioid (overdosed). Common symptoms of an overdose: Your breathing is slower or more shallow than normal. You have a very slow heartbeat (pulse). You have slurred speech. You have nausea and vomiting. Your pupils become very small. You have other potential symptoms: You are very confused. You faint or feel like you will faint. You have cold, clammy skin. You have blue lips or fingernails. You have thoughts of harming yourself or harming others. These symptoms may represent a serious problem that is an emergency. Do not wait to see if the symptoms will go away. Get medical help right away. Call your local emergency  services (911 in the [place] Do not drive yourself to the hospital.  If you ever feel like you may hurt yourself or others, or have thoughts about taking your own life, get help right away. Go to your nearest emergency department or: Call your local emergency services (911 in the [place] Call the [facility] 915 477 3968 in the [place] Call a suicide crisis helpline, such as the [place] at 6280461526 or 988 in the [place]. This is open 24 hours a day in the [place] Text the Crisis Text Line at 281-091-0627 (in the [place].). Summary Opioid medicines can help you manage moderate to severe pain for a short period of time. A pain treatment plan is an agreement between you and your health care provider. Discuss the goals of your treatment, including how much pain you might expect to have and how you will manage the pain. If you think that you or someone else may have taken too much of an opioid, get medical help right away. This information is not intended to replace advice given to you by your health care provider. Make sure you discuss any questions you have with your health care provider. Document Revised: 10/22/2020 Document Reviewed: 07/09/2020 Elsevier Patient Education  2022 [employer].   Next appointment: Follow up in one year for your annual wellness visit  Preventive Care 65 Years and Older, Female Preventive care refers to lifestyle choices and visits with your health care provider that can promote health and wellness. What does preventive care include? A yearly physical exam. This is also called an annual well check. Dental exams once or twice a year. Routine eye exams. Ask your health care provider how often you should have your eyes checked. Personal lifestyle choices, including: Daily care of your teeth and gums. Regular physical activity. Eating a healthy diet. Avoiding tobacco and drug use. Limiting alcohol use. Practicing safe  sex. Taking low-dose aspirin every day. Taking vitamin and mineral supplements as recommended by your health care provider. What happens during an annual well check? The services and screenings done by your health care provider during your annual well check will depend on your age, overall health, lifestyle risk factors, and family history of disease. Counseling  Your health care provider may ask you questions about your: Alcohol use. Tobacco use. Drug use. Emotional well-being. Home and relationship well-being. Sexual activity. Eating habits. History of falls. Memory and ability to understand (cognition). Work and work Statistician. Reproductive health. Screening  You may have the following tests or measurements: Height, weight, and BMI. Blood pressure. Lipid and cholesterol levels. These may be checked every 5 years, or more frequently if you are over 36 years old. Skin check. Lung cancer screening. You may have this screening every  year starting at age 7 if you have a 30-pack-year history of smoking and currently smoke or have quit within the past 15 years. Fecal occult blood test (FOBT) of the stool. You may have this test every year starting at age 23. Flexible sigmoidoscopy or colonoscopy. You may have a sigmoidoscopy every 5 years or a colonoscopy every 10 years starting at age 71. Hepatitis C blood test. Hepatitis B blood test. Sexually transmitted disease (STD) testing. Diabetes screening. This is done by checking your blood sugar (glucose) after you have not eaten for a while (fasting). You may have this done every 1-3 years. Bone density scan. This is done to screen for osteoporosis. You may have this done starting at age 13. Mammogram. This may be done every 1-2 years. Talk to your health care provider about how often you should have regular mammograms. Talk with your health care provider about your test results, treatment options, and if necessary, the need for more  tests. Vaccines  Your health care provider may recommend certain vaccines, such as: Influenza vaccine. This is recommended every year. Tetanus, diphtheria, and acellular pertussis (Tdap, Td) vaccine. You may need a Td booster every 10 years. Zoster vaccine. You may need this after age 70. Pneumococcal 13-valent conjugate (PCV13) vaccine. One dose is recommended after age 90. Pneumococcal polysaccharide (PPSV23) vaccine. One dose is recommended after age 46. Talk to your health care provider about which screenings and vaccines you need and how often you need them. This information is not intended to replace advice given to you by your health care provider. Make sure you discuss any questions you have with your health care provider. Document Released: 04/25/2015 Document Revised: 12/17/2015 Document Reviewed: 01/28/2015 Elsevier Interactive Patient Education  2017 [place] Prevention in the Home Falls can cause injuries. They can happen to people of all ages. There are many things you can do to make your home safe and to help prevent falls. What can I do on the outside of my home? Regularly fix the edges of walkways and driveways and fix any cracks. Remove anything that might make you trip as you walk through a door, such as a raised step or threshold. Trim any bushes or trees on the path to your home. Use bright outdoor lighting. Clear any walking paths of anything that might make someone trip, such as rocks or tools. Regularly check to see if handrails are loose or broken. Make sure that both sides of any steps have handrails. Any raised decks and porches should have guardrails on the edges. Have any leaves, snow, or ice cleared regularly. Use sand or salt on walking paths during winter. Clean up any spills in your garage right away. This includes oil or grease spills. What can I do in the bathroom? Use night lights. Install grab bars by the toilet and in the tub and shower.  Do not use towel bars as grab bars. Use non-skid mats or decals in the tub or shower. If you need to sit down in the shower, use a plastic, non-slip stool. Keep the floor dry. Clean up any water that spills on the floor as soon as it happens. Remove soap buildup in the tub or shower regularly. Attach bath mats securely with double-sided non-slip rug tape. Do not have throw rugs and other things on the floor that can make you trip. What can I do in the bedroom? Use night lights. Make sure that you have a light by your bed that  is easy to reach. Do not use any sheets or blankets that are too big for your bed. They should not hang down onto the floor. Have a firm chair that has side arms. You can use this for support while you get dressed. Do not have throw rugs and other things on the floor that can make you trip. What can I do in the kitchen? Clean up any spills right away. Avoid walking on wet floors. Keep items that you use a lot in easy-to-reach places. If you need to reach something above you, use a strong step stool that has a grab bar. Keep electrical cords out of the way. Do not use floor polish or wax that makes floors slippery. If you must use wax, use non-skid floor wax. Do not have throw rugs and other things on the floor that can make you trip. What can I do with my stairs? Do not leave any items on the stairs. Make sure that there are handrails on both sides of the stairs and use them. Fix handrails that are broken or loose. Make sure that handrails are as long as the stairways. Check any carpeting to make sure that it is firmly attached to the stairs. Fix any carpet that is loose or worn. Avoid having throw rugs at the top or bottom of the stairs. If you do have throw rugs, attach them to the floor with carpet tape. Make sure that you have a light switch at the top of the stairs and the bottom of the stairs. If you do not have them, ask someone to add them for you. What else  can I do to help prevent falls? Wear shoes that: Do not have high heels. Have rubber bottoms. Are comfortable and fit you well. Are closed at the toe. Do not wear sandals. If you use a stepladder: Make sure that it is fully opened. Do not climb a closed stepladder. Make sure that both sides of the stepladder are locked into place. Ask someone to hold it for you, if possible. Clearly mark and make sure that you can see: Any grab bars or handrails. First and last steps. Where the edge of each step is. Use tools that help you move around (mobility aids) if they are needed. These include: Canes. Walkers. Scooters. Crutches. Turn on the lights when you go into a dark area. Replace any light bulbs as soon as they burn out. Set up your furniture so you have a clear path. Avoid moving your furniture around. If any of your floors are uneven, fix them. If there are any pets around you, be aware of where they are. Review your medicines with your doctor. Some medicines can make you feel dizzy. This can increase your chance of falling. Ask your doctor what other things that you can do to help prevent falls. This information is not intended to replace advice given to you by your health care provider. Make sure you discuss any questions you have with your health care provider. Document Released: 01/23/2009 Document Revised: 09/04/2015 Document Reviewed: 05/03/2014 Elsevier Interactive Patient Education  2017 [employer].

## 2021-04-22 ENCOUNTER — Encounter: Payer: Self-pay | Admitting: Family Medicine

## 2021-04-22 MED ORDER — GABAPENTIN 600 MG PO TABS: ORAL_TABLET | ORAL | 0 refills | Status: DC

## 2021-04-24 ENCOUNTER — Encounter: Payer: Self-pay | Admitting: Family Medicine

## 2021-04-27 MED ORDER — AMLODIPINE BESYLATE 5 MG PO TABS: 5.0000 mg | ORAL_TABLET | Freq: Every day | ORAL | 3 refills | Status: DC

## 2021-04-27 MED ORDER — ROSUVASTATIN CALCIUM 40 MG PO TABS: 40.0000 mg | ORAL_TABLET | Freq: Every day | ORAL | 3 refills | Status: DC

## 2021-04-27 MED ORDER — LISINOPRIL 20 MG PO TABS: 20.0000 mg | ORAL_TABLET | Freq: Every day | ORAL | 3 refills | Status: DC

## 2021-04-27 MED ORDER — PANTOPRAZOLE SODIUM 40 MG PO TBEC: 40.0000 mg | DELAYED_RELEASE_TABLET | Freq: Every day | ORAL | 3 refills | Status: DC

## 2021-05-01 ENCOUNTER — Encounter: Payer: Self-pay | Admitting: Family Medicine

## 2021-05-01 MED ORDER — GABAPENTIN 600 MG PO TABS: ORAL_TABLET | ORAL | 1 refills | Status: DC

## 2021-06-08 DIAGNOSIS — Z1151 Encounter for screening for human papillomavirus (HPV): Secondary | ICD-10-CM | POA: Diagnosis not present

## 2021-06-08 DIAGNOSIS — Z1231 Encounter for screening mammogram for malignant neoplasm of breast: Secondary | ICD-10-CM | POA: Diagnosis not present

## 2021-06-08 DIAGNOSIS — Z124 Encounter for screening for malignant neoplasm of cervix: Secondary | ICD-10-CM | POA: Diagnosis not present

## 2021-06-08 DIAGNOSIS — Z01419 Encounter for gynecological examination (general) (routine) without abnormal findings: Secondary | ICD-10-CM | POA: Diagnosis not present

## 2021-06-08 DIAGNOSIS — R29890 Loss of height: Secondary | ICD-10-CM | POA: Diagnosis not present

## 2021-06-08 DIAGNOSIS — N958 Other specified menopausal and perimenopausal disorders: Secondary | ICD-10-CM | POA: Diagnosis not present

## 2021-06-08 LAB — HM DEXA SCAN

## 2021-06-09 LAB — HM MAMMOGRAPHY

## 2021-06-09 LAB — HM PAP SMEAR: HM Pap smear: NORMAL

## 2021-06-30 ENCOUNTER — Encounter: Payer: Self-pay | Admitting: Family Medicine

## 2021-06-30 ENCOUNTER — Ambulatory Visit (INDEPENDENT_AMBULATORY_CARE_PROVIDER_SITE_OTHER): Payer: Medicare Other | Admitting: Family Medicine

## 2021-06-30 VITALS — BP 110/60 | HR 90 | Temp 98.1°F | Ht 66.0 in | Wt 177.6 lb

## 2021-06-30 DIAGNOSIS — R251 Tremor, unspecified: Secondary | ICD-10-CM | POA: Diagnosis not present

## 2021-06-30 DIAGNOSIS — I10 Essential (primary) hypertension: Secondary | ICD-10-CM | POA: Diagnosis not present

## 2021-06-30 DIAGNOSIS — M8580 Other specified disorders of bone density and structure, unspecified site: Secondary | ICD-10-CM

## 2021-06-30 LAB — TSH: TSH: 1.55 u[IU]/mL (ref 0.35–5.50)

## 2021-06-30 LAB — VITAMIN D 25 HYDROXY (VIT D DEFICIENCY, FRACTURES): VITD: 63.69 ng/mL (ref 30.00–100.00)
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
## 2021-07-01 ENCOUNTER — Encounter: Payer: Self-pay | Admitting: Neurology

## 2021-07-02 NOTE — Progress Notes (Signed)
? ?Assessment/Plan:  ? ? 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  Did not see any dystonic component when she was writing. ?Discussed nature and pathophysiology.  Discussed treatments, if she wanted any.  She decided she really wanted something only on an as-needed basis.  Discussed with her about propranolol is really about the only thing safely used on an as-needed basis.  I think she could probably tolerate that.  Her pulse certainly could.  Her blood pressure was on the lower range, although she is on several blood pressure medications.  We will just give her 20 mg of propranolol to use as needed.  I told her to trial it for some day when she is home and not doing anything and make sure she tolerates it.  If she tolerates it fine, then she can just use it when and if needed before events in which she expects to be writing (which is the time when she notices her tremor).  She should take the propranolol about an hour before that. ? ?2.  History of cryptogenic right PCA infarct, 2017 ? -Patient on aspirin.  Told her she could decrease the aspirin from 325 mg to 81 mg. ? -LDL is at goal.  She is on Crestor. ? ?3.  She can give me feedback on the above.  If doing fine, she can follow-up as needed. ? ?Subjective:  ? ?Andrea [name] was seen today in the movement disorders clinic for neurologic consultation at the request of Burchette, Alinda Sierras, MD.  The consultation is for the evaluation of RUE tremor x 1 year.  Outside records that were made available to me were reviewed. ? ?Tremor: Yes.    ? How long has it been going on? 1.5 years ? At rest or with activation?  activation ? When is it noted the most?  Writing (no problem with eating, drinking) and notes it when holds hand straight out ? Fam hx of tremor?  No. ? Located where?  R hand only ? Affected by caffeine:  doesn't drink very much ? Affected by alcohol:  doesn't drink alcohol ? Affected by stress:  Yes.   ? Affected  by fatigue:  No. But physical heat will increase the tremor ? Spills soup if on spoon:  No. ? Spills glass of liquid if full:  No. ? Affects ADL's (tying shoes, brushing teeth, etc):  No. ? Tremor inducing meds:  No. ? ?Other Specific Symptoms:  ?Voice: no change ?Sleep: no change ?Postural symptoms:  No. ? Falls?  Only fall related to dog pulling her over ?Bradykinesia symptoms: no bradykinesia noted ?Loss of smell:  No. ?Loss of taste:  No. ?Urinary Incontinence:  No. ?Difficulty Swallowing:  No. ?Handwriting, micrographia: No., gotten bigger ?Depression:  No. ?Memory changes:  Yes.  , some short term issues since her stroke in 2017 ?N/V:  No. ?Lightheaded:  No. ? Syncope: No. ?Diplopia:  No. ?Dyskinesia:  No. ? ?MRI brain last done 2017.  There was a R PCA infarct and mild to mod WMD.  I have personally reviewed those records. ? ?PREVIOUS MEDICATIONS: none to date ? ?ALLERGIES:   ?Allergies  ?Allergen Reactions  ? Lipitor [Atorvastatin] Other (See Comments)  ?  Muscle Pain  ? ? ?CURRENT MEDICATIONS:  ?Current Outpatient Medications  ?Medication Instructions  ? amLODipine (NORVASC) 5 mg, Oral, Daily  ? aspirin 325 mg, Oral, Daily  ? butalbital-acetaminophen-caffeine (FIORICET) 50-325-40 MG tablet 1 tablet, Oral, Every 6 hours  PRN  ? Calcium-Vitamin D (CALTRATE 600 PLUS-VIT D PO) 3 tablets, Oral, Daily  ? Cholecalciferol (VITAMIN D3) 5000 UNITS TABS Oral  ? cyclobenzaprine (FLEXERIL) 10 mg, Oral, Daily at bedtime  ? gabapentin (NEURONTIN) 600 MG tablet One tablet in the morning One tablet in the afternoon and Two tablets at bedtime  ? lisinopril (ZESTRIL) 20 mg, Oral, Daily  ? pantoprazole (PROTONIX) 40 mg, Oral, Daily  ? rosuvastatin (CRESTOR) 40 mg, Oral, Daily  ? vitamin B-12 (CYANOCOBALAMIN) 1,000 mcg, Oral, Daily  ? Zinc 50 MG TABS 1 tablet, Oral, Daily  ? ? ?Objective:  ? ?PHYSICAL EXAMINATION:   ? ?VITALS:   ?Vitals:  ? 07/06/21 0835  ?BP: 110/70  ?Pulse: 99  ?Resp: 18  ?SpO2: 100%  ?Weight: 180 lb (81.6 kg)   ?Height: '5\' 6"'$  (1.676 m)  ? ? ?GEN:  The patient appears stated age and is in NAD. ?HEENT:  Normocephalic, atraumatic.  The mucous membranes are moist. The superficial temporal arteries are without ropiness or tenderness. ?CV:  RRR ?Lungs:  CTAB ?Neck/HEME:  There are no carotid bruits bilaterally. ? ?Neurological examination: ? ?Orientation: The patient is alert and oriented x3.  ?Cranial nerves: There is good facial symmetry.  Extraocular muscles are intact. The visual fields are full to confrontational testing. The speech is fluent and clear. Soft palate rises symmetrically and there is no tongue deviation. Hearing is intact to conversational tone. ?Sensation: Sensation is intact to light touch throughout (facial, trunk, extremities). Vibration is intact at the bilateral big toe but it is decreased distally. There is no extinction with double simultaneous stimulation.  ?Motor: Strength is 5/5 in the bilateral upper and lower extremities.   Shoulder shrug is equal and symmetric.  There is no pronator drift. ?Deep tendon reflexes: Deep tendon reflexes are 2/4 at the bilateral biceps, triceps, brachioradialis, 1/4 at the bilateral patella and achilles. Plantar responses are downgoing bilaterally. ? ?Movement examination: ?Tone: There is normal tone in the bilateral upper extremities.  The tone in the lower extremities is normal.  ?Abnormal movements: There is no rest tremor.  There is no postural tremor.  There is slight intention tremor on the right.  She has slight tremor with Archimedes spirals on the right.  No significant issues with pouring water from 1 glass to another (she does spill some but that was because of carelessness more than tremor). ?Coordination:  There is no decremation with RAM's, with any form of RAMS, including alternating supination and pronation of the forearm, hand opening and closing, finger taps, heel taps and toe taps. ?Gait and Station: The patient has no difficulty arising out of a  deep-seated chair without the use of the hands. The patient's stride length is good.  She is able to stand in the Romberg position. ?I have reviewed and interpreted the following labs independently ?  Chemistry   ?   ?Component Value Date/Time  ? NA 142 11/17/2020 1950  ? K 3.9 11/17/2020 1950  ? CL 104 11/17/2020 1950  ? CO2 29 11/17/2020 1950  ? BUN 14 11/17/2020 1950  ? CREATININE 1.02 (H) 11/17/2020 1950  ? CREATININE 1.18 (H) 03/19/2020 1006  ?    ?Component Value Date/Time  ? CALCIUM 9.5 11/17/2020 1950  ? ALKPHOS 85 11/17/2020 1950  ? AST 16 11/17/2020 1950  ? ALT 13 11/17/2020 1950  ? BILITOT 0.5 11/17/2020 1950  ?  ? ? ?Lab Results  ?Component Value Date  ? TSH 1.55 06/30/2021  ? ?Lab Results  ?  Component Value Date  ? WBC 9.1 11/17/2020  ? HGB 13.4 11/17/2020  ? HCT 39.6 11/17/2020  ? MCV 87.2 11/17/2020  ? PLT 212 11/17/2020  ? ?Lab Results  ?Component Value Date  ? CHOL 125 12/31/2020  ? HDL 50.70 12/31/2020  ? Shelly 51 12/31/2020  ? TRIG 113.0 12/31/2020  ? CHOLHDL 2 12/31/2020  ? ? ? ?Cc:  Eulas Post, MD ? ?

## 2021-07-06 ENCOUNTER — Other Ambulatory Visit: Payer: Self-pay

## 2021-07-06 ENCOUNTER — Ambulatory Visit: Payer: Medicare Other | Admitting: Neurology

## 2021-07-06 ENCOUNTER — Encounter: Payer: Self-pay | Admitting: Neurology

## 2021-07-06 VITALS — BP 110/70 | HR 99 | Resp 18 | Ht 66.0 in | Wt 180.0 lb

## 2021-07-06 DIAGNOSIS — G250 Essential tremor: Secondary | ICD-10-CM | POA: Diagnosis not present

## 2021-07-06 DIAGNOSIS — Z8673 Personal history of transient ischemic attack (TIA), and cerebral infarction without residual deficits: Secondary | ICD-10-CM

## 2021-07-06 MED ORDER — PROPRANOLOL HCL 20 MG PO TABS: ORAL_TABLET | ORAL | 3 refills | Status: DC

## 2021-07-06 NOTE — Patient Instructions (Signed)
You can trial propranolol 20 mg daily as needed for tremor.  I would trial it first on a day you are home and doing nothing to make sure that you had no side effects.  If you are ok, then you can just use it as needed from there on out, about 1 hour before the event you are trying to avoid shaking with. ? ?The physicians and staff at [facility] are committed to providing excellent care. You may receive a survey requesting feedback about your experience at our office. We strive to receive "very good" responses to the survey questions. If you feel that your experience would prevent you from giving the office a "very good " response, please contact our office to try to remedy the situation. We may be reached at [phone]. Thank you for taking the time out of your busy day to complete the survey. ? ?

## 2021-08-10 DIAGNOSIS — M8580 Other specified disorders of bone density and structure, unspecified site: Secondary | ICD-10-CM | POA: Diagnosis not present

## 2021-08-12 ENCOUNTER — Encounter: Payer: Self-pay | Admitting: Family Medicine

## 2021-08-12 ENCOUNTER — Ambulatory Visit (INDEPENDENT_AMBULATORY_CARE_PROVIDER_SITE_OTHER): Payer: Medicare Other | Admitting: Family Medicine

## 2021-08-12 VITALS — BP 110/76 | HR 87 | Temp 98.4°F | Wt 178.0 lb

## 2021-08-12 DIAGNOSIS — S80812A Abrasion, left lower leg, initial encounter: Secondary | ICD-10-CM

## 2021-08-12 DIAGNOSIS — Z23 Encounter for immunization: Secondary | ICD-10-CM

## 2021-08-12 DIAGNOSIS — L089 Local infection of the skin and subcutaneous tissue, unspecified: Secondary | ICD-10-CM | POA: Diagnosis not present

## 2021-08-12 MED ORDER — CEPHALEXIN 500 MG PO CAPS: 500.0000 mg | ORAL_CAPSULE | Freq: Three times a day (TID) | ORAL | 0 refills | Status: DC

## 2021-08-12 NOTE — Addendum Note (Signed)
Addended by: Wyvonne Lenz on: 08/12/2021 11:13 AM ? ? Modules accepted: Orders ? ?

## 2021-08-12 NOTE — Progress Notes (Signed)
? ?  Subjective:  ? ? Patient ID: Andrea [name], female    DOB: [birth year], 71 y.o.   MRN: [id number] ? ?HPI ?Here for an injury to the left shin that occurred 3 days ago. While she was picking up some trash in her yard late at night, she lost her balance and struck the shim against the curbing along the road. She has been apply peroxide and antibiotic ointment since then. It is still painful and she is worried about potential infection.  ? ? ?Review of Systems  ?Constitutional: Negative.   ?Respiratory: Negative.    ?Cardiovascular: Negative.   ?Skin:  Positive for wound.  ? ?   ?Objective:  ? Physical Exam ?Constitutional:   ?   General: She is not in acute distress. ?   Appearance: Normal appearance.  ?Cardiovascular:  ?   Rate and Rhythm: Normal rate and regular rhythm.  ?   Pulses: Normal pulses.  ?   Heart sounds: Normal heart sounds.  ?Pulmonary:  ?   Effort: Pulmonary effort is normal.  ?   Breath sounds: Normal breath sounds.  ?Skin: ?   Comments: The left lower anterior leg has an abrasion that goes down to the subcutaneous fat layer. It is covered by granulation tissue, and has a thin rim of erythema around it   ?Neurological:  ?   Mental Status: She is alert.  ? ? ? ? ? ?   ?Assessment & Plan:  ?Leg abrasion. First I advised her to stop using peroxide and to cleanse it daily with soap and water. We will cover for  infection with 7 days of Keflex. She is given a TDaP.  ?Alysia Penna, MD ? ? ?

## 2021-08-26 ENCOUNTER — Ambulatory Visit (INDEPENDENT_AMBULATORY_CARE_PROVIDER_SITE_OTHER): Payer: Medicare Other | Admitting: Family Medicine

## 2021-08-26 ENCOUNTER — Encounter: Payer: Self-pay | Admitting: Family Medicine

## 2021-08-26 VITALS — BP 118/60 | HR 93 | Temp 98.5°F | Ht 66.0 in | Wt 179.5 lb

## 2021-08-26 DIAGNOSIS — Z8673 Personal history of transient ischemic attack (TIA), and cerebral infarction without residual deficits: Secondary | ICD-10-CM | POA: Diagnosis not present

## 2021-08-26 DIAGNOSIS — S81812D Laceration without foreign body, left lower leg, subsequent encounter: Secondary | ICD-10-CM

## 2021-08-26 DIAGNOSIS — R251 Tremor, unspecified: Secondary | ICD-10-CM
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
## 2021-09-10 ENCOUNTER — Encounter: Payer: Self-pay | Admitting: Family Medicine

## 2021-09-11 MED ORDER — BUTALBITAL-APAP-CAFFEINE 50-325-40 MG PO TABS: 1.0000 | ORAL_TABLET | Freq: Four times a day (QID) | ORAL | 0 refills | Status: DC | PRN

## 2021-09-27 ENCOUNTER — Encounter (HOSPITAL_BASED_OUTPATIENT_CLINIC_OR_DEPARTMENT_OTHER): Payer: Self-pay

## 2021-09-27 ENCOUNTER — Emergency Department (HOSPITAL_BASED_OUTPATIENT_CLINIC_OR_DEPARTMENT_OTHER)
Admission: EM | Admit: 2021-09-27 | Discharge: 2021-09-27 | Disposition: A | Discharge status: Home / Self Care | Payer: Medicare Other | Attending: Emergency Medicine | Admitting: Emergency Medicine

## 2021-09-27 ENCOUNTER — Other Ambulatory Visit: Payer: Self-pay

## 2021-09-27 ENCOUNTER — Emergency Department (HOSPITAL_BASED_OUTPATIENT_CLINIC_OR_DEPARTMENT_OTHER): Payer: Medicare Other

## 2021-09-27 DIAGNOSIS — W19XXXA Unspecified fall, initial encounter: Secondary | ICD-10-CM

## 2021-09-27 DIAGNOSIS — S4992XA Unspecified injury of left shoulder and upper arm, initial encounter: Secondary | ICD-10-CM | POA: Diagnosis not present

## 2021-09-27 DIAGNOSIS — Z7982 Long term (current) use of aspirin: Secondary | ICD-10-CM | POA: Diagnosis not present

## 2021-09-27 DIAGNOSIS — Y93K1 Activity, walking an animal: Secondary | ICD-10-CM | POA: Insufficient documentation

## 2021-09-27 DIAGNOSIS — M25512 Pain in left shoulder: Secondary | ICD-10-CM | POA: Diagnosis not present

## 2021-09-27 DIAGNOSIS — W1839XA Other fall on same level, initial encounter: Secondary | ICD-10-CM | POA: Diagnosis not present

## 2021-09-27 NOTE — ED Provider Notes (Signed)
[facility] EMERGENCY DEPT Provider Note   CSN: [id number] Arrival date & time: 09/27/21  1704     History  Chief Complaint  Patient presents with   Andrea [name]    Andrea [name] is a 71 y.o. female who presents to the emergency department complaint of a fall onset prior to arrival.  Patient was walking her dog when her dog pulled and caused her to fall and land on her left shoulder.  Denies hitting her head or LOC.  Denies anticoagulant use.  Denies chest pain, shortness of breath, swelling, color change, wound.  The history is provided by the patient. No language interpreter was used.       Home Medications Prior to Admission medications   Medication Sig Start Date End Date Taking? Authorizing Provider  amLODipine (NORVASC) 5 MG tablet Take 1 tablet (5 mg total) by mouth daily. 04/27/21   Burchette, Alinda Sierras, MD  aspirin 325 MG tablet Take 1 tablet (325 mg total) by mouth daily. 01/30/16   Laqueta Linden, MD  butalbital-acetaminophen-caffeine (FIORICET) [phone] MG tablet Take 1 tablet by mouth every 6 (six) hours as needed for headache. 09/11/21   Burchette, Alinda Sierras, MD  Calcium-Vitamin D (CALTRATE 600 PLUS-VIT D PO) Take 3 tablets by mouth daily.    [provider]  Cholecalciferol (VITAMIN D3) 5000 UNITS TABS Take by mouth.    [provider]  cyclobenzaprine (FLEXERIL) 10 MG tablet Take 1 tablet (10 mg total) by mouth at bedtime. 12/09/20   Burchette, Alinda Sierras, MD  gabapentin (NEURONTIN) 600 MG tablet One tablet in the morning One tablet in the afternoon and Two tablets at bedtime 05/01/21   Burchette, Alinda Sierras, MD  lisinopril (ZESTRIL) 20 MG tablet Take 1 tablet (20 mg total) by mouth daily. 04/27/21   Burchette, Alinda Sierras, MD  pantoprazole (PROTONIX) 40 MG tablet Take 1 tablet (40 mg total) by mouth daily. 04/27/21   Burchette, Alinda Sierras, MD  propranolol (INDERAL) 20 MG tablet 1 po daily prn tremor 07/06/21   Tat, Eustace Quail, DO  rosuvastatin (CRESTOR) 40 MG  tablet Take 1 tablet (40 mg total) by mouth daily. 04/27/21   Burchette, Alinda Sierras, MD  vitamin B-12 (CYANOCOBALAMIN) 1000 MCG tablet Take 1,000 mcg by mouth daily.    [provider]  Zinc 50 MG TABS Take 1 tablet by mouth daily.    [provider]      Allergies    Lipitor [atorvastatin]    Review of Systems   Review of Systems  Respiratory:  Negative for shortness of breath.   Cardiovascular:  Negative for chest pain.  Gastrointestinal:  Negative for nausea and vomiting.  Musculoskeletal:  Positive for arthralgias. Negative for joint swelling.  Skin:  Negative for color change and wound.  Neurological:  Negative for syncope.  All other systems reviewed and are negative.   Physical Exam Updated Vital Signs BP 133/81 (BP Location: Right Arm)   Pulse 85   Temp (!) 97 F (36.1 C) (Temporal)   Resp 16   Ht '5\' 6"'$  (1.676 m)   Wt 81.4 kg   SpO2 99%   BMI 28.96 kg/m  Physical Exam Vitals and nursing note reviewed.  Constitutional:      General: She is not in acute distress.    Appearance: She is not diaphoretic.  HENT:     Head: Normocephalic and atraumatic.     Mouth/Throat:     Pharynx: No oropharyngeal exudate.  Eyes:  General: No scleral icterus.    Conjunctiva/sclera: Conjunctivae normal.  Cardiovascular:     Rate and Rhythm: Normal rate and regular rhythm.     Pulses: Normal pulses.     Heart sounds: Normal heart sounds.  Pulmonary:     Effort: Pulmonary effort is normal. No respiratory distress.     Breath sounds: Normal breath sounds. No wheezing.  Abdominal:     General: Bowel sounds are normal.     Palpations: Abdomen is soft. There is no mass.     Tenderness: There is no abdominal tenderness. There is no guarding or rebound.  Musculoskeletal:        General: Normal range of motion.     Cervical back: Normal range of motion and neck supple.     Comments: TTP noted to left AC/GH joint without overlying skin changes, deformities, or  step-offs. Decreased ROM of left shoulder secondary to pain. No TTP noted to biceps tendon. No TTP noted to left olecranon with supination or pronation.  Skin:    General: Skin is warm and dry.  Neurological:     Mental Status: She is alert.  Psychiatric:        Behavior: Behavior normal.     ED Results / Procedures / Treatments   Labs (all labs ordered are listed, but only abnormal results are displayed) Labs Reviewed - No data to display  EKG None  Radiology DG Shoulder Left  Result Date: 09/27/2021 CLINICAL DATA:  Acute LEFT shoulder injury and fall today. Initial encounter. EXAM: LEFT SHOULDER - 2+ VIEW COMPARISON:  07/08/2020 FINDINGS: There is no evidence of fracture or dislocation. There is no evidence of arthropathy or other focal bone abnormality. Soft tissues are unremarkable. IMPRESSION: Negative. Electronically Signed   By: Margarette Canada M.D.   On: 09/27/2021 17:42    Procedures Procedures    Medications Ordered in ED Medications - No data to display  ED Course/ Medical Decision Making/ A&P                            Medical Decision Making Amount and/or Complexity of Data Reviewed Radiology: ordered.   Pt with fall occurring prior to arrival.  Patient notes that she was walking her dog when her dog pulled her and caused her to land on her left shoulder on the ground.  Patient is not on anticoagulants at this time.  Denies hitting her head, LOC, chest pain, shortness of breath, nausea, vomiting.  Vital signs stable, patient afebrile.  On exam patient with tenderness to palpation noted to left AC/GH joint.  No overlying skin changes or obvious deformity or step-off noticed.  No spinal tenderness to palpation.  No acute cardiovascular or respiratory exam findings.  Differential diagnosis includes fracture, dislocation, contusion.  Okay   Imaging: I ordered imaging studies including left shoulder x-ray I independently visualized and interpreted imaging which  showed: Negative for acute fracture or dislocation I agree with the radiologist interpretation   Disposition: Presentation suspicious for contusion of AC/GH joint.  Doubt fracture or dislocation at this time. After consideration of the diagnostic results and the patients response to treatment, I feel that the patient would benefit from Discharge home.  Provided patient with information for on-call sports medicine doctor.  Also discussed with patient that she can follow-up with her primary care provider as needed regarding today's ED visit. Supportive care and return precautions discussed with patient and significant other. Return precautions  given to the patient including shortness of breath, worsening chest pain, or worsening headache. Appears safe for discharge at this time. Follow up as indicated in discharge paperwork.   This chart was dictated using voice recognition software, Dragon. Despite the best efforts of this provider to proofread and correct errors, errors may still occur which can change documentation meaning.  Final Clinical Impression(s) / ED Diagnoses Final diagnoses:  Fall, initial encounter  Acute pain of left shoulder    Rx / DC Orders ED Discharge Orders     None         Amilee Janvier A, PA-C 09/27/21 Lillette Boxer, MD 09/29/21 1525

## 2021-09-27 NOTE — ED Triage Notes (Signed)
Patient here POV from Home.  Fell after her Dog pulled her onto the [employer] Outside. Fell onto left Shoulder and endorses Pain to Same.   No Known Head Injury. No LOC. No Anticoagulants.   NAD Noted during Triage. A&Ox4. GCS 15. Ambulatory.

## 2021-09-27 NOTE — Discharge Instructions (Signed)
It was a pleasure taking care of you!   Your x-ray was negative for fracture or dislocation.  You may take over the counter 500 mg Tylenol every 6 hours as needed for pain for no more than 7 days. You may apply ice or heat to affected area for up to 15 minutes at a time. Ensure to place a barrier between your skin and the ice/heat.  You may follow-up with your primary care provider as needed.  Attached is information for the on-call sports medicine doctor, you may call and set up a follow-up appointment regarding today's ED visit.  Return to the Emergency Department if you are experiencing increasing/worsening pain, swelling, color change, fever, or worsening symptoms.

## 2021-09-29 DIAGNOSIS — M25512 Pain in left shoulder: Secondary | ICD-10-CM | POA: Diagnosis not present

## 2021-10-19 ENCOUNTER — Encounter: Payer: Self-pay | Admitting: Family Medicine

## 2021-10-19 MED ORDER — CYCLOBENZAPRINE HCL 10 MG PO TABS: 10.0000 mg | ORAL_TABLET | Freq: Every day | ORAL | 1 refills | Status: DC

## 2021-10-19 MED ORDER — GABAPENTIN 600 MG PO TABS: ORAL_TABLET | ORAL | 1 refills | Status: DC

## 2021-12-22 ENCOUNTER — Ambulatory Visit (INDEPENDENT_AMBULATORY_CARE_PROVIDER_SITE_OTHER): Payer: Medicare Other | Admitting: Family Medicine

## 2021-12-22 ENCOUNTER — Encounter: Payer: Self-pay | Admitting: Family Medicine

## 2021-12-22 VITALS — BP 120/72 | HR 80 | Temp 98.0°F | Ht 65.75 in | Wt 180.4 lb

## 2021-12-22 DIAGNOSIS — Z23 Encounter for immunization: Secondary | ICD-10-CM

## 2021-12-22 DIAGNOSIS — Z0000 Encounter for general adult medical examination without abnormal findings: Secondary | ICD-10-CM | POA: Diagnosis not present

## 2021-12-22 DIAGNOSIS — N289 Disorder of kidney and ureter, unspecified: Secondary | ICD-10-CM | POA: Diagnosis not present

## 2021-12-22 DIAGNOSIS — I10 Essential (primary) hypertension: Secondary | ICD-10-CM

## 2021-12-22 LAB — HEPATIC FUNCTION PANEL
ALT: 17 U/L (ref 0–35)
AST: 17 U/L (ref 0–37)
Albumin: 4.3 g/dL (ref 3.5–5.2)
Alkaline Phosphatase: 99 U/L (ref 39–117)
Bilirubin, Direct: 0.1 mg/dL (ref 0.0–0.3)
Total Bilirubin: 0.4 mg/dL (ref 0.2–1.2)
Total Protein: 6.9 g/dL (ref 6.0–8.3)

## 2021-12-22 LAB — CBC WITH DIFFERENTIAL/PLATELET
Basophils Absolute: 0.1 10*3/uL (ref 0.0–0.1)
Basophils Relative: 0.8 % (ref 0.0–3.0)
Eosinophils Absolute: 0.2 10*3/uL (ref 0.0–0.7)
Eosinophils Relative: 2.8 % (ref 0.0–5.0)
HCT: 39.9 % (ref 36.0–46.0)
Hemoglobin: 13.6 g/dL (ref 12.0–15.0)
Lymphocytes Relative: 30.1 % (ref 12.0–46.0)
Lymphs Abs: 2.6 10*3/uL (ref 0.7–4.0)
MCHC: 34 g/dL (ref 30.0–36.0)
MCV: 85.9 fl (ref 78.0–100.0)
Monocytes Absolute: 0.7 10*3/uL (ref 0.1–1.0)
Monocytes Relative: 8.4 % (ref 3.0–12.0)
Neutro Abs: 5.1 10*3/uL (ref 1.4–7.7)
Neutrophils Relative %: 57.9 % (ref 43.0–77.0)
Platelets: 248 10*3/uL (ref 150.0–400.0)
RBC: 4.65 Mil/uL (ref 3.87–5.11)
RDW: 12.9 % (ref 11.5–15.5)
WBC: 8.8 10*3/uL (ref 4.0–10.5)

## 2021-12-22 LAB — LIPID PANEL
Cholesterol: 132 mg/dL (ref 0–200)
HDL: 49.3 mg/dL (ref >39.00)
LDL Cholesterol: 56 mg/dL (ref 0–99)
NonHDL: 82.96
Total CHOL/HDL Ratio: 3
Triglycerides: 134 mg/dL (ref 0.0–149.0)
VLDL: 26.8 mg/dL (ref 0.0–40.0)

## 2021-12-22 LAB — BASIC METABOLIC PANEL
BUN: 19 mg/dL (ref 6–23)
CO2: 30 mEq/L (ref 19–32)
Calcium: 9.8 mg/dL (ref 8.4–10.5)
Chloride: 102 mEq/L (ref 96–112)
Creatinine, Ser: 1.33 mg/dL — ABNORMAL HIGH (ref 0.40–1.20)
GFR: 40.58 mL/min — ABNORMAL LOW (ref >60.00)
Glucose, Bld: 106 mg/dL — ABNORMAL HIGH (ref 70–99)
Potassium: 4.1 mEq/L (ref 3.5–5.1)
Sodium: 140 mEq/L (ref 135–145)

## 2021-12-22 LAB — TSH: TSH: 3.45 u[IU]/mL (ref 0.35–5.50)

## 2021-12-22 MED ORDER — BUTALBITAL-APAP-CAFFEINE 50-325-40 MG PO TABS: 1.0000 | ORAL_TABLET | Freq: Four times a day (QID) | ORAL | 0 refills | Status: DC | PRN

## 2021-12-22 NOTE — Patient Instructions (Signed)
Consider Shingrix vaccine at some point this year.  

## 2021-12-22 NOTE — Addendum Note (Signed)
Addended by: Nilda Riggs on: 12/22/2021 12:01 PM   Modules accepted: Orders

## 2021-12-22 NOTE — Addendum Note (Signed)
Addended by: Nilda Riggs on: 12/22/2021 01:31 PM   Modules accepted: Orders
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
## 2022-01-21 ENCOUNTER — Other Ambulatory Visit (INDEPENDENT_AMBULATORY_CARE_PROVIDER_SITE_OTHER): Payer: Medicare Other

## 2022-01-21 DIAGNOSIS — N289 Disorder of kidney and ureter, unspecified: Secondary | ICD-10-CM

## 2022-01-21 LAB — BASIC METABOLIC PANEL
BUN: 15 mg/dL (ref 6–23)
CO2: 29 mEq/L (ref 19–32)
Calcium: 9.6 mg/dL (ref 8.4–10.5)
Chloride: 103 mEq/L (ref 96–112)
Creatinine, Ser: 1.19 mg/dL (ref 0.40–1.20)
GFR: 46.35 mL/min — ABNORMAL LOW (ref >60.00)
Glucose, Bld: 104 mg/dL — ABNORMAL HIGH (ref 70–99)
Potassium: 3.9 mEq/L (ref 3.5–5.1)
Sodium: 140 mEq/L (ref 135–145)

## 2022-02-01 ENCOUNTER — Ambulatory Visit (INDEPENDENT_AMBULATORY_CARE_PROVIDER_SITE_OTHER): Payer: Medicare Other | Admitting: Family Medicine

## 2022-02-01 ENCOUNTER — Encounter: Payer: Self-pay | Admitting: Family Medicine

## 2022-02-01 VITALS — BP 110/60 | HR 88 | Temp 98.1°F | Ht 65.75 in | Wt 178.6 lb

## 2022-02-01 DIAGNOSIS — R682 Dry mouth, unspecified: Secondary | ICD-10-CM | POA: Diagnosis not present

## 2022-02-01 NOTE — Patient Instructions (Signed)
Do not see any evidence for thrush.  Consider scaling back the Gabapentin, as discussed.   Try leaving off the Flexeril.
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
## 2022-02-22 ENCOUNTER — Encounter: Payer: Self-pay | Admitting: Family Medicine

## 2022-02-22 ENCOUNTER — Other Ambulatory Visit: Payer: Self-pay

## 2022-02-22 ENCOUNTER — Other Ambulatory Visit: Payer: Self-pay | Admitting: Family Medicine

## 2022-02-22 DIAGNOSIS — I10 Essential (primary) hypertension: Secondary | ICD-10-CM

## 2022-02-22 MED ORDER — AMLODIPINE BESYLATE 5 MG PO TABS: 5.0000 mg | ORAL_TABLET | Freq: Every day | ORAL | 1 refills | Status: DC

## 2022-02-24 ENCOUNTER — Ambulatory Visit (INDEPENDENT_AMBULATORY_CARE_PROVIDER_SITE_OTHER): Payer: Medicare Other | Admitting: Family Medicine

## 2022-02-24 ENCOUNTER — Ambulatory Visit (INDEPENDENT_AMBULATORY_CARE_PROVIDER_SITE_OTHER): Payer: Medicare Other

## 2022-02-24 VITALS — BP 116/64 | HR 70 | Temp 97.4°F | Wt 179.0 lb

## 2022-02-24 DIAGNOSIS — M1611 Unilateral primary osteoarthritis, right hip: Secondary | ICD-10-CM | POA: Diagnosis not present

## 2022-02-24 DIAGNOSIS — M25551 Pain in right hip: Secondary | ICD-10-CM

## 2022-02-24 MED ORDER — TRAMADOL HCL 50 MG PO TABS: 50.0000 mg | ORAL_TABLET | Freq: Four times a day (QID) | ORAL | 0 refills | Status: AC | PRN
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
## 2022-03-19 ENCOUNTER — Other Ambulatory Visit: Payer: Self-pay

## 2022-03-19 ENCOUNTER — Encounter: Payer: Self-pay | Admitting: Family Medicine

## 2022-03-19 DIAGNOSIS — M25519 Pain in unspecified shoulder: Secondary | ICD-10-CM

## 2022-03-19 MED ORDER — GABAPENTIN 600 MG PO TABS: ORAL_TABLET | ORAL | 1 refills | Status: DC

## 2022-03-19 MED ORDER — PANTOPRAZOLE SODIUM 40 MG PO TBEC: 40.0000 mg | DELAYED_RELEASE_TABLET | Freq: Every day | ORAL | 1 refills | Status: DC

## 2022-03-22 ENCOUNTER — Ambulatory Visit (INDEPENDENT_AMBULATORY_CARE_PROVIDER_SITE_OTHER): Payer: Medicare Other

## 2022-03-22 VITALS — BP 118/62 | HR 86 | Temp 98.2°F | Ht 63.75 in | Wt 174.6 lb

## 2022-03-22 DIAGNOSIS — Z0000 Encounter for general adult medical examination without abnormal findings: Secondary | ICD-10-CM
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
## 2022-03-22 NOTE — Patient Instructions (Addendum)
Andrea [name] , Thank you for taking time to come for your Medicare Wellness Visit. I appreciate your ongoing commitment to your health goals. Please review the following plan we discussed and let me know if I can assist you in the future.   These are the goals we discussed:  Goals       Patient Stated (pt-stated)      To maintain my health and  Continue to walk my dog.         This is a list of the screening recommended for you and due dates:  Health Maintenance  Topic Date Due   COVID-19 Vaccine (4 - 2023-24 season) 04/07/2022*   Zoster (Shingles) Vaccine (1 of 2) 06/21/2022*   Mammogram  06/09/2022   Medicare Annual Wellness Visit  03/23/2023   Colon Cancer Screening  05/09/2028   DTaP/Tdap/Td vaccine (3 - Td or Tdap) 08/13/2031   Pneumonia Vaccine  Completed   Flu Shot  Completed   DEXA scan (bone density measurement)  Completed   Hepatitis C Screening: USPSTF Recommendation to screen - Ages 22-79 yo.  Completed   HPV Vaccine  Aged Out  *Topic was postponed. The date shown is not the original due date.    Advanced directives: Please bring a copy of your health care power of attorney and living will to the office to be added to your chart at your convenience.   Conditions/risks identified: None   Next appointment: Follow up in one year for your annual wellness visit     Preventive Care 65 Years and Older, Female Preventive care refers to lifestyle choices and visits with your health care provider that can promote health and wellness. What does preventive care include? A yearly physical exam. This is also called an annual well check. Dental exams once or twice a year. Routine eye exams. Ask your health care provider how often you should have your eyes checked. Personal lifestyle choices, including: Daily care of your teeth and gums. Regular physical activity. Eating a healthy diet. Avoiding tobacco and drug use. Limiting alcohol use. Practicing safe sex. Taking  low-dose aspirin every day. Taking vitamin and mineral supplements as recommended by your health care provider. What happens during an annual well check? The services and screenings done by your health care provider during your annual well check will depend on your age, overall health, lifestyle risk factors, and family history of disease. Counseling  Your health care provider may ask you questions about your: Alcohol use. Tobacco use. Drug use. Emotional well-being. Home and relationship well-being. Sexual activity. Eating habits. History of falls. Memory and ability to understand (cognition). Work and work Statistician. Reproductive health. Screening  You may have the following tests or measurements: Height, weight, and BMI. Blood pressure. Lipid and cholesterol levels. These may be checked every 5 years, or more frequently if you are over 90 years old. Skin check. Lung cancer screening. You may have this screening every year starting at age 61 if you have a 30-pack-year history of smoking and currently smoke or have quit within the past 15 years. Fecal occult blood test (FOBT) of the stool. You may have this test every year starting at age 40. Flexible sigmoidoscopy or colonoscopy. You may have a sigmoidoscopy every 5 years or a colonoscopy every 10 years starting at age 21. Hepatitis C blood test. Hepatitis B blood test. Sexually transmitted disease (STD) testing. Diabetes screening. This is done by checking your blood sugar (glucose) after you have not eaten for  a while (fasting). You may have this done every 1-3 years. Bone density scan. This is done to screen for osteoporosis. You may have this done starting at age 49. Mammogram. This may be done every 1-2 years. Talk to your health care provider about how often you should have regular mammograms. Talk with your health care provider about your test results, treatment options, and if necessary, the need for more tests. Vaccines   Your health care provider may recommend certain vaccines, such as: Influenza vaccine. This is recommended every year. Tetanus, diphtheria, and acellular pertussis (Tdap, Td) vaccine. You may need a Td booster every 10 years. Zoster vaccine. You may need this after age 73. Pneumococcal 13-valent conjugate (PCV13) vaccine. One dose is recommended after age 14. Pneumococcal polysaccharide (PPSV23) vaccine. One dose is recommended after age 20. Talk to your health care provider about which screenings and vaccines you need and how often you need them. This information is not intended to replace advice given to you by your health care provider. Make sure you discuss any questions you have with your health care provider. Document Released: 04/25/2015 Document Revised: 12/17/2015 Document Reviewed: 01/28/2015 Elsevier Interactive Patient Education  2017 [place] Prevention in the Home Falls can cause injuries. They can happen to people of all ages. There are many things you can do to make your home safe and to help prevent falls. What can I do on the outside of my home? Regularly fix the edges of walkways and driveways and fix any cracks. Remove anything that might make you trip as you walk through a door, such as a raised step or threshold. Trim any bushes or trees on the path to your home. Use bright outdoor lighting. Clear any walking paths of anything that might make someone trip, such as rocks or tools. Regularly check to see if handrails are loose or broken. Make sure that both sides of any steps have handrails. Any raised decks and porches should have guardrails on the edges. Have any leaves, snow, or ice cleared regularly. Use sand or salt on walking paths during winter. Clean up any spills in your garage right away. This includes oil or grease spills. What can I do in the bathroom? Use night lights. Install grab bars by the toilet and in the tub and shower. Do not use towel  bars as grab bars. Use non-skid mats or decals in the tub or shower. If you need to sit down in the shower, use a plastic, non-slip stool. Keep the floor dry. Clean up any water that spills on the floor as soon as it happens. Remove soap buildup in the tub or shower regularly. Attach bath mats securely with double-sided non-slip rug tape. Do not have throw rugs and other things on the floor that can make you trip. What can I do in the bedroom? Use night lights. Make sure that you have a light by your bed that is easy to reach. Do not use any sheets or blankets that are too big for your bed. They should not hang down onto the floor. Have a firm chair that has side arms. You can use this for support while you get dressed. Do not have throw rugs and other things on the floor that can make you trip. What can I do in the kitchen? Clean up any spills right away. Avoid walking on wet floors. Keep items that you use a lot in easy-to-reach places. If you need to reach something above  you, use a strong step stool that has a grab bar. Keep electrical cords out of the way. Do not use floor polish or wax that makes floors slippery. If you must use wax, use non-skid floor wax. Do not have throw rugs and other things on the floor that can make you trip. What can I do with my stairs? Do not leave any items on the stairs. Make sure that there are handrails on both sides of the stairs and use them. Fix handrails that are broken or loose. Make sure that handrails are as long as the stairways. Check any carpeting to make sure that it is firmly attached to the stairs. Fix any carpet that is loose or worn. Avoid having throw rugs at the top or bottom of the stairs. If you do have throw rugs, attach them to the floor with carpet tape. Make sure that you have a light switch at the top of the stairs and the bottom of the stairs. If you do not have them, ask someone to add them for you. What else can I do to help  prevent falls? Wear shoes that: Do not have high heels. Have rubber bottoms. Are comfortable and fit you well. Are closed at the toe. Do not wear sandals. If you use a stepladder: Make sure that it is fully opened. Do not climb a closed stepladder. Make sure that both sides of the stepladder are locked into place. Ask someone to hold it for you, if possible. Clearly mark and make sure that you can see: Any grab bars or handrails. First and last steps. Where the edge of each step is. Use tools that help you move around (mobility aids) if they are needed. These include: Canes. Walkers. Scooters. Crutches. Turn on the lights when you go into a dark area. Replace any light bulbs as soon as they burn out. Set up your furniture so you have a clear path. Avoid moving your furniture around. If any of your floors are uneven, fix them. If there are any pets around you, be aware of where they are. Review your medicines with your doctor. Some medicines can make you feel dizzy. This can increase your chance of falling. Ask your doctor what other things that you can do to help prevent falls. This information is not intended to replace advice given to you by your health care provider. Make sure you discuss any questions you have with your health care provider. Document Released: 01/23/2009 Document Revised: 09/04/2015 Document Reviewed: 05/03/2014 Elsevier Interactive Patient Education  2017 [employer].

## 2022-04-02 DIAGNOSIS — M169 Osteoarthritis of hip, unspecified: Secondary | ICD-10-CM | POA: Diagnosis not present

## 2022-04-02 DIAGNOSIS — M1611 Unilateral primary osteoarthritis, right hip: Secondary | ICD-10-CM | POA: Diagnosis not present

## 2022-04-02 DIAGNOSIS — M25551 Pain in right hip: Secondary | ICD-10-CM | POA: Diagnosis not present

## 2022-04-13 ENCOUNTER — Encounter: Payer: Self-pay | Admitting: Family Medicine

## 2022-04-13 MED ORDER — BUTALBITAL-APAP-CAFFEINE 50-325-40 MG PO TABS: 1.0000 | ORAL_TABLET | Freq: Four times a day (QID) | ORAL | 0 refills | Status: DC | PRN

## 2022-04-13 NOTE — Patient Instructions (Signed)
DUE TO COVID-19 ONLY TWO VISITORS  (aged 71 and older)  ARE ALLOWED TO COME WITH YOU AND STAY IN THE WAITING ROOM ONLY DURING PRE OP AND PROCEDURE.   **NO VISITORS ARE ALLOWED IN THE SHORT STAY AREA OR RECOVERY ROOM!!**  IF YOU WILL BE ADMITTED INTO THE HOSPITAL YOU ARE ALLOWED ONLY FOUR SUPPORT PEOPLE DURING VISITATION HOURS ONLY (7 AM -8PM)   The support person(s) must pass our screening, gel in and out, and wear a mask at all times, including in the patient's room. Patients must also wear a mask when staff or their support person are in the room. Visitors GUEST BADGE MUST BE WORN VISIBLY  One adult visitor may remain with you overnight and MUST be in the room by 8 P.M.     Your procedure is scheduled on: 04/27/22   Report to [facility] Main Entrance    Report to admitting at :1:45 PM   Call this number if you have problems the morning of surgery [phone]   Do not eat food :After Midnight.   After Midnight you may have the following liquids until : 1:00  PM DAY OF SURGERY  Water Black Coffee (sugar ok, NO MILK/CREAM OR CREAMERS)  Tea (sugar ok, NO MILK/CREAM OR CREAMERS) regular and decaf                             Plain Jell-O (NO RED)                                           Fruit ices (not with fruit pulp, NO RED)                                     Popsicles (NO RED)                                                                  Juice: apple, WHITE grape, WHITE cranberry Sports drinks like Gatorade (NO RED)   The day of surgery:  Drink ONE (1) Pre-Surgery Clear Ensure or G2 at : 1:00 PM the morning of surgery. Drink in one sitting. Do not sip.  This drink was given to you during your hospital  pre-op appointment visit. Nothing else to drink after completing the  Pre-Surgery Clear Ensure or G2.          If you have questions, please contact your surgeon's office.  Oral Hygiene is also important to reduce your risk of infection.                                     Remember - BRUSH YOUR TEETH THE MORNING OF SURGERY WITH YOUR REGULAR TOOTHPASTE  DENTURES WILL BE REMOVED PRIOR TO SURGERY PLEASE DO NOT APPLY "Poly grip" OR ADHESIVES!!!   Do NOT smoke after Midnight   Take these medicines the morning of surgery with A SIP OF WATER: gabapentin,propanolol,amlodipine,pantoprazole.Tylenol,Fioricet as needed.  You may not have any metal on your body including hair pins, jewelry, and body piercing             Do not wear make-up, lotions, powders, perfumes/cologne, or deodorant  Do not wear nail polish including gel and S&S, artificial/acrylic nails, or any other type of covering on natural nails including finger and toenails. If you have artificial nails, gel coating, etc. that needs to be removed by a nail salon please have this removed prior to surgery or surgery may need to be canceled/ delayed if the surgeon/ anesthesia feels like they are unable to be safely monitored.   Do not shave  48 hours prior to surgery.    Do not bring valuables to the hospital. [place].   Contacts, glasses, or bridgework may not be worn into surgery.   Bring small overnight bag day of surgery.   DO NOT [place]. PHARMACY WILL DISPENSE MEDICATIONS LISTED ON YOUR MEDICATION LIST TO YOU DURING YOUR ADMISSION [place]!    Patients discharged on the day of surgery will not be allowed to drive home.  Someone NEEDS to stay with you for the first 24 hours after anesthesia.   Special Instructions: Bring a copy of your healthcare power of attorney and living will documents         the day of surgery if you haven't scanned them before.              Please read over the following fact sheets you were given: IF YOU HAVE QUESTIONS ABOUT YOUR PRE-OP INSTRUCTIONS PLEASE CALL [phone]    [facility] - Preparing for Surgery Before surgery, you can play an  important role.  Because skin is not sterile, your skin needs to be as free of germs as possible.  You can reduce the number of germs on your skin by washing with CHG (chlorahexidine gluconate) soap before surgery.  CHG is an antiseptic cleaner which kills germs and bonds with the skin to continue killing germs even after washing. Please DO NOT use if you have an allergy to CHG or antibacterial soaps.  If your skin becomes reddened/irritated stop using the CHG and inform your nurse when you arrive at Short Stay. Do not shave (including legs and underarms) for at least 48 hours prior to the first CHG shower.  You may shave your face/neck. Please follow these instructions carefully:  1.  Shower with CHG Soap the night before surgery and the  morning of Surgery.  2.  If you choose to wash your hair, wash your hair first as usual with your  normal  shampoo.  3.  After you shampoo, rinse your hair and body thoroughly to remove the  shampoo.                           4.  Use CHG as you would any other liquid soap.  You can apply chg directly  to the skin and wash                       Gently with a scrungie or clean washcloth.  5.  Apply the CHG Soap to your body ONLY FROM THE NECK DOWN.   Do not use on face/ open  Wound or open sores. Avoid contact with eyes, ears mouth and genitals (private parts).                       Wash face,  Genitals (private parts) with your normal soap.             6.  Wash thoroughly, paying special attention to the area where your surgery  will be performed.  7.  Thoroughly rinse your body with warm water from the neck down.  8.  DO NOT shower/wash with your normal soap after using and rinsing off  the CHG Soap.                9.  Pat yourself dry with a clean towel.            10.  Wear clean pajamas.            11.  Place clean sheets on your bed the night of your first shower and do not  sleep with pets. Day of Surgery : Do not apply any  lotions/deodorants the morning of surgery.  Please wear clean clothes to the hospital/surgery center.  FAILURE TO FOLLOW THESE INSTRUCTIONS MAY RESULT IN THE CANCELLATION OF YOUR SURGERY PATIENT SIGNATURE_________________________________  NURSE SIGNATURE__________________________________  ________________________________________________________________________  Adam Phenix  An incentive spirometer is a tool that can help keep your lungs clear and active. This tool measures how well you are filling your lungs with each breath. Taking long deep breaths may help reverse or decrease the chance of developing breathing (pulmonary) problems (especially infection) following: A long period of time when you are unable to move or be active. BEFORE THE PROCEDURE  If the spirometer includes an indicator to show your best effort, your nurse or respiratory therapist will set it to a desired goal. If possible, sit up straight or lean slightly forward. Try not to slouch. Hold the incentive spirometer in an upright position. INSTRUCTIONS FOR USE  Sit on the edge of your bed if possible, or sit up as far as you can in bed or on a chair. Hold the incentive spirometer in an upright position. Breathe out normally. Place the mouthpiece in your mouth and seal your lips tightly around it. Breathe in slowly and as deeply as possible, raising the piston or the ball toward the top of the column. Hold your breath for 3-5 seconds or for as long as possible. Allow the piston or ball to fall to the bottom of the column. Remove the mouthpiece from your mouth and breathe out normally. Rest for a few seconds and repeat Steps 1 through 7 at least 10 times every 1-2 hours when you are awake. Take your time and take a few normal breaths between deep breaths. The spirometer may include an indicator to show your best effort. Use the indicator as a goal to work toward during each repetition. After each set of 10 deep  breaths, practice coughing to be sure your lungs are clear. If you have an incision (the cut made at the time of surgery), support your incision when coughing by placing a pillow or rolled up towels firmly against it. Once you are able to get out of bed, walk around indoors and cough well. You may stop using the incentive spirometer when instructed by your caregiver.  RISKS AND COMPLICATIONS Take your time so you do not get dizzy or light-headed. If you are in pain, you may need to take or ask  for pain medication before doing incentive spirometry. It is harder to take a deep breath if you are having pain. AFTER USE Rest and breathe slowly and easily. It can be helpful to keep track of a log of your progress. Your caregiver can provide you with a simple table to help with this. If you are using the spirometer at home, follow these instructions: [place] IF:  You are having difficultly using the spirometer. You have trouble using the spirometer as often as instructed. Your pain medication is not giving enough relief while using the spirometer. You develop fever of 100.5 F (38.1 C) or higher. SEEK IMMEDIATE MEDICAL CARE IF:  You cough up bloody sputum that had not been present before. You develop fever of 102 F (38.9 C) or greater. You develop worsening pain at or near the incision site. MAKE SURE YOU:  Understand these instructions. Will watch your condition. Will get help right away if you are not doing well or get worse. Document Released: 08/09/2006 Document Revised: 06/21/2011 Document Reviewed: 10/10/2006 [facility] Patient Information 2014 [place], [place].   ________________________________________________________________________

## 2022-04-13 NOTE — Telephone Encounter (Signed)
Refilled the Fioricet.   Eulas Post MD [facility] '

## 2022-04-14 ENCOUNTER — Other Ambulatory Visit: Payer: Self-pay

## 2022-04-14 ENCOUNTER — Encounter (HOSPITAL_COMMUNITY)
Admission: RE | Admit: 2022-04-14 | Discharge: 2022-04-14 | Disposition: A | Discharge status: Home / Self Care | Payer: Medicare Other | Source: Ambulatory Visit | Attending: Orthopedic Surgery | Admitting: Orthopedic Surgery

## 2022-04-14 ENCOUNTER — Encounter (HOSPITAL_COMMUNITY): Payer: Self-pay

## 2022-04-14 VITALS — BP 128/75 | HR 84 | Temp 98.2°F | Ht 65.0 in | Wt 170.0 lb

## 2022-04-14 DIAGNOSIS — Z01818 Encounter for other preprocedural examination: Secondary | ICD-10-CM | POA: Insufficient documentation

## 2022-04-14 DIAGNOSIS — M1611 Unilateral primary osteoarthritis, right hip: Secondary | ICD-10-CM | POA: Diagnosis not present

## 2022-04-14 DIAGNOSIS — I10 Essential (primary) hypertension: Secondary | ICD-10-CM | POA: Insufficient documentation

## 2022-04-14 LAB — TYPE AND SCREEN
ABO/RH(D): O POS
Antibody Screen: NEGATIVE

## 2022-04-14 LAB — CBC
HCT: 40.4 % (ref 36.0–46.0)
Hemoglobin: 13.4 g/dL (ref 12.0–15.0)
MCH: 29.6 pg (ref 26.0–34.0)
MCHC: 33.2 g/dL (ref 30.0–36.0)
MCV: 89.2 fL (ref 80.0–100.0)
Platelets: 236 10*3/uL (ref 150–400)
RBC: 4.53 MIL/uL (ref 3.87–5.11)
RDW: 11.9 % (ref 11.5–15.5)
WBC: 7 10*3/uL (ref 4.0–10.5)
nRBC: 0 % (ref 0.0–0.2)

## 2022-04-14 LAB — BASIC METABOLIC PANEL
Anion gap: 8 (ref 5–15)
BUN: 15 mg/dL (ref 8–23)
CO2: 27 mmol/L (ref 22–32)
Calcium: 9.3 mg/dL (ref 8.9–10.3)
Chloride: 103 mmol/L (ref 98–111)
Creatinine, Ser: 1.15 mg/dL — ABNORMAL HIGH (ref 0.44–1.00)
GFR, Estimated: 51 mL/min — ABNORMAL LOW (ref >60)
Glucose, Bld: 114 mg/dL — ABNORMAL HIGH (ref 70–99)
Potassium: 3.7 mmol/L (ref 3.5–5.1)
Sodium: 138 mmol/L (ref 135–145)

## 2022-04-14 LAB — SURGICAL PCR SCREEN
MRSA, PCR: NEGATIVE
Staphylococcus aureus: NEGATIVE

## 2022-04-14 NOTE — Progress Notes (Addendum)
For Short Stay: [place] appointment date:  Bowel Prep reminder:   For Anesthesia: PCP - Dr. Carolann Littler. LOV: 02/24/22 Cardiologist - N/A  Chest x-ray -  EKG -  Stress Test -  ECHO - 01/29/16 Cardiac Cath -  Pacemaker/ICD device last checked: Pacemaker orders received: Device Rep notified:  Spinal Cord Stimulator:  Sleep Study -  CPAP -   Fasting Blood Sugar -  Checks Blood Sugar _____ times a day Date and result of last Hgb A1c-  Last dose of GLP1 agonist-  GLP1 instructions:   Last dose of SGLT-2 inhibitors-  SGLT-2 instructions:   Blood Thinner Instructions: Dr. Alvan Dame Aspirin Instructions: Aspirin 325 mg will be held a week before surgery. Last Dose:  Activity level: Can go up a flight of stairs and activities of daily living without stopping and without chest pain and/or shortness of breath   Able to exercise without chest pain and/or shortness of breath  Anesthesia review: Hx: HTN,Stroke.  Patient denies shortness of breath, fever, cough and chest pain at PAT appointment   Patient verbalized understanding of instructions that were given to them at the PAT appointment. Patient was also instructed that they will need to review over the PAT instructions again at home before surgery.

## 2022-04-15 ENCOUNTER — Encounter: Payer: Self-pay | Admitting: Family Medicine

## 2022-04-16 DIAGNOSIS — H52223 Regular astigmatism, bilateral: Secondary | ICD-10-CM | POA: Diagnosis not present

## 2022-04-18 ENCOUNTER — Encounter (HOSPITAL_COMMUNITY): Payer: Self-pay | Admitting: Physician Assistant

## 2022-04-20 NOTE — Telephone Encounter (Signed)
Noted  
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
## 2022-04-26 ENCOUNTER — Encounter: Payer: Self-pay | Admitting: Family Medicine

## 2022-04-26 ENCOUNTER — Ambulatory Visit (INDEPENDENT_AMBULATORY_CARE_PROVIDER_SITE_OTHER): Payer: Medicare Other | Admitting: Family Medicine

## 2022-04-26 VITALS — BP 118/70 | HR 101 | Temp 98.1°F | Ht 65.0 in | Wt 174.3 lb

## 2022-04-26 DIAGNOSIS — Z01818 Encounter for other preprocedural examination: Secondary | ICD-10-CM | POA: Diagnosis not present

## 2022-04-26 DIAGNOSIS — I10 Essential (primary) hypertension: Secondary | ICD-10-CM

## 2022-04-26 DIAGNOSIS — E785 Hyperlipidemia, unspecified: Secondary | ICD-10-CM | POA: Diagnosis not present

## 2022-04-26 DIAGNOSIS — J209 Acute bronchitis, unspecified: Secondary | ICD-10-CM | POA: Diagnosis not present

## 2022-04-26 MED ORDER — HYDROCODONE BIT-HOMATROP MBR 5-1.5 MG/5ML PO SOLN: 5.0000 mL | Freq: Four times a day (QID) | ORAL | 0 refills | Status: DC | PRN
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
## 2022-04-27 ENCOUNTER — Encounter (HOSPITAL_COMMUNITY): Admission: RE | Payer: Self-pay | Source: Ambulatory Visit

## 2022-04-27 ENCOUNTER — Ambulatory Visit (HOSPITAL_COMMUNITY)
Admission: RE | Admit: 2022-04-27 | Payer: BLUE CROSS/BLUE SHIELD | Source: Ambulatory Visit | Admitting: Orthopedic Surgery

## 2022-04-27 DIAGNOSIS — M1611 Unilateral primary osteoarthritis, right hip: Secondary | ICD-10-CM

## 2022-04-27 SURGERY — ARTHROPLASTY, HIP, TOTAL, ANTERIOR APPROACH
Anesthesia: Spinal | Site: Hip | Laterality: Right

## 2022-04-27 NOTE — Progress Notes (Signed)
Patients surgery today with Dr Alvan Dame has been cancelled.  Patient verbalized understanding. Patient has been instructed to contact his office to get rescheduled.  Jasmine Pang BSN, Academic librarian - Perioperative Services [place] [phone]

## 2022-04-29 ENCOUNTER — Encounter: Payer: Self-pay | Admitting: Family Medicine

## 2022-05-11 NOTE — Progress Notes (Signed)
Surgery orders requested via Epic inbox. °

## 2022-05-12 NOTE — Patient Instructions (Addendum)
DUE TO COVID-19 ONLY TWO VISITORS  (aged 71 and older)  ARE ALLOWED TO COME WITH YOU AND STAY IN THE WAITING ROOM ONLY DURING PRE OP AND PROCEDURE.   **NO VISITORS ARE ALLOWED IN THE SHORT STAY AREA OR RECOVERY ROOM!!**  IF YOU WILL BE ADMITTED INTO THE HOSPITAL YOU ARE ALLOWED ONLY FOUR SUPPORT PEOPLE DURING VISITATION HOURS ONLY (7 AM -8PM)   The support person(s) must pass our screening, gel in and out, and wear a mask at all times, including in the patient's room. Patients must also wear a mask when staff or their support person are in the room. Visitors GUEST BADGE MUST BE WORN VISIBLY  One adult visitor may remain with you overnight and MUST be in the room by 8 P.M.     Your procedure is scheduled on: 05-27-22   Report to [facility] Main Entrance    Report to admitting at :      0830 AM   Call this number if you have problems the morning of surgery [phone]   Do not eat food :After Midnight.   After Midnight you may have the following liquids until : 0800 AM DAY OF SURGERY  THEN NOTHING BY MOUTH  Water Black Coffee (sugar ok, NO MILK/CREAM OR CREAMERS)  Tea (sugar ok, NO MILK/CREAM OR CREAMERS) regular and decaf                             Plain Jell-O (NO RED)                                           Fruit ices (not with fruit pulp, NO RED)                                     Popsicles (NO RED)                                                                  Juice: apple, WHITE grape, WHITE cranberry Sports drinks like Gatorade (NO RED)   The day of surgery:  Drink ONE (1) Pre-Surgery Clear Ensure at : 0750 PM the morning of surgery. Drink in one sitting. Do not sip.  This drink was given to you during your hospital  pre-op appointment visit. Nothing else to drink after completing the  Pre-Surgery Clear Ensure           If you have questions, please contact your surgeon's office.  Oral Hygiene is also important to reduce your risk of infection.                                     Remember - BRUSH YOUR TEETH THE MORNING OF SURGERY WITH YOUR REGULAR TOOTHPASTE  DENTURES WILL BE REMOVED PRIOR TO SURGERY PLEASE DO NOT APPLY "Poly grip" OR ADHESIVES!!!   Do NOT smoke after Midnight   Take these medicines the morning of surgery with A SIP OF WATER:  gabapentin, propanolol, amlodipine, pantoprazole.Tylenol if needed,Fioricet if needed., rosuvastatin                              You may not have any metal on your body including hair pins, jewelry, and body piercing             Do not wear make-up, lotions, powders, perfumes/cologne, or deodorant  Do not wear nail polish including gel and S&S, artificial/acrylic nails, or any other type of covering on natural nails including finger and toenails. If you have artificial nails, gel coating, etc. that needs to be removed by a nail salon please have this removed prior to surgery or surgery may need to be canceled/ delayed if the surgeon/ anesthesia feels like they are unable to be safely monitored.   Do not shave  48 hours prior to surgery.    Do not bring valuables to the hospital. [place].   Contacts, glasses, or bridgework may not be worn into surgery.   Bring small overnight bag day of surgery.   DO NOT [place]. PHARMACY WILL DISPENSE MEDICATIONS LISTED ON YOUR MEDICATION LIST TO YOU DURING YOUR ADMISSION [place]!    Patients discharged on the day of surgery will not be allowed to drive home.  Someone NEEDS to stay with you for the first 24 hours after anesthesia.               Please read over the following fact sheets you were given: IF YOU HAVE QUESTIONS ABOUT YOUR PRE-OP INSTRUCTIONS PLEASE CALL [phone]    [facility] - Preparing for Surgery Before surgery, you can play an important role.  Because skin is not sterile, your skin needs to be as free of germs as possible.  You can reduce the number  of germs on your skin by washing with CHG (chlorahexidine gluconate) soap before surgery.  CHG is an antiseptic cleaner which kills germs and bonds with the skin to continue killing germs even after washing. Please DO NOT use if you have an allergy to CHG or antibacterial soaps.  If your skin becomes reddened/irritated stop using the CHG and inform your nurse when you arrive at Short Stay. Do not shave (including legs and underarms) for at least 48 hours prior to the first CHG shower.  You may shave your face/neck. Please follow these instructions carefully:  1.  Shower with CHG Soap the night before surgery and the  morning of Surgery.  2.  If you choose to wash your hair, wash your hair first as usual with your  normal  shampoo.  3.  After you shampoo, rinse your hair and body thoroughly to remove the  shampoo.                           4.  Use CHG as you would any other liquid soap.  You can apply chg directly  to the skin and wash                       Gently with a scrungie or clean washcloth.  5.  Apply the CHG Soap to your body ONLY FROM THE NECK DOWN.   Do not use on face/ open  Wound or open sores. Avoid contact with eyes, ears mouth and genitals (private parts).                       Wash face,  Genitals (private parts) with your normal soap.             6.  Wash thoroughly, paying special attention to the area where your surgery  will be performed.  7.  Thoroughly rinse your body with warm water from the neck down.  8.  DO NOT shower/wash with your normal soap after using and rinsing off  the CHG Soap.                9.  Pat yourself dry with a clean towel.            10.  Wear clean pajamas.            11.  Place clean sheets on your bed the night of your first shower and do not  sleep with pets. Day of Surgery : Do not apply any lotions/deodorants the morning of surgery.  Please wear clean clothes to the hospital/surgery center.  FAILURE TO FOLLOW THESE  INSTRUCTIONS MAY RESULT IN THE CANCELLATION OF YOUR SURGERY PATIENT SIGNATURE_________________________________  NURSE SIGNATURE__________________________________  ________________________________________________________________________  Andrea [name]  An incentive spirometer is a tool that can help keep your lungs clear and active. This tool measures how well you are filling your lungs with each breath. Taking long deep breaths may help reverse or decrease the chance of developing breathing (pulmonary) problems (especially infection) following: A long period of time when you are unable to move or be active. BEFORE THE PROCEDURE  If the spirometer includes an indicator to show your best effort, your nurse or respiratory therapist will set it to a desired goal. If possible, sit up straight or lean slightly forward. Try not to slouch. Hold the incentive spirometer in an upright position. INSTRUCTIONS FOR USE  Sit on the edge of your bed if possible, or sit up as far as you can in bed or on a chair. Hold the incentive spirometer in an upright position. Breathe out normally. Place the mouthpiece in your mouth and seal your lips tightly around it. Breathe in slowly and as deeply as possible, raising the piston or the ball toward the top of the column. Hold your breath for 3-5 seconds or for as long as possible. Allow the piston or ball to fall to the bottom of the column. Remove the mouthpiece from your mouth and breathe out normally. Rest for a few seconds and repeat Steps 1 through 7 at least 10 times every 1-2 hours when you are awake. Take your time and take a few normal breaths between deep breaths. The spirometer may include an indicator to show your best effort. Use the indicator as a goal to work toward during each repetition. After each set of 10 deep breaths, practice coughing to be sure your lungs are clear. If you have an incision (the cut made at the time of surgery), support  your incision when coughing by placing a pillow or rolled up towels firmly against it. Once you are able to get out of bed, walk around indoors and cough well. You may stop using the incentive spirometer when instructed by your caregiver.  RISKS AND COMPLICATIONS Take your time so you do not get dizzy or light-headed. If you are in pain, you may need to take or ask  for pain medication before doing incentive spirometry. It is harder to take a deep breath if you are having pain. AFTER USE Rest and breathe slowly and easily. It can be helpful to keep track of a log of your progress. Your caregiver can provide you with a simple table to help with this. If you are using the spirometer at home, follow these instructions: [place] IF:  You are having difficultly using the spirometer. You have trouble using the spirometer as often as instructed. Your pain medication is not giving enough relief while using the spirometer. You develop fever of 100.5 F (38.1 C) or higher. SEEK IMMEDIATE MEDICAL CARE IF:  You cough up bloody sputum that had not been present before. You develop fever of 102 F (38.9 C) or greater. You develop worsening pain at or near the incision site. MAKE SURE YOU:  Understand these instructions. Will watch your condition. Will get help right away if you are not doing well or get worse. Document Released: 08/09/2006 Document Revised: 06/21/2011 Document Reviewed: 10/10/2006 [facility] Patient Information 2014 [place], [place].   ________________________________________________________________________

## 2022-05-12 NOTE — Progress Notes (Addendum)
PCP - Carolann Littler, MD  clearance 04-26-22 epic Cardiologist - Thompson Grayer , MD LOV 11-12-19   PPM/ICD -  Device Orders -  Rep Notified -   Chest x-ray -  EKG - 04-14-22 epic Stress Test -  ECHO -  Cardiac Cath -   Sleep Study -  CPAP -   Fasting Blood Sugar -  Checks Blood Sugar _____ times a day  Blood Thinner Instructions: Aspirin Instructions: 325 ASA stop 5 days prior  ERAS Protcol - PRE-SURGERY Ensure    COVID vaccine -yes  Activity--Able to climb a flight of stairs without SOB or CP  Anesthesia review: Surgery cancelled 04-27-22 due to URI, cryptogenic stroke ILR implant was monitored 3.5 years with no evidence of a fib  Patient denies shortness of breath, fever, cough and chest pain at PAT appointment   All instructions explained to the patient, with a verbal understanding of the material. Patient agrees to go over the instructions while at home for a better understanding. Patient also instructed to self quarantine after being tested for COVID-19. The opportunity to ask questions was provided.

## 2022-05-18 ENCOUNTER — Other Ambulatory Visit: Payer: Self-pay

## 2022-05-18 ENCOUNTER — Encounter (HOSPITAL_COMMUNITY): Payer: Self-pay

## 2022-05-18 ENCOUNTER — Encounter (HOSPITAL_COMMUNITY)
Admission: RE | Admit: 2022-05-18 | Discharge: 2022-05-18 | Disposition: A | Discharge status: Home / Self Care | Payer: Medicare Other | Source: Ambulatory Visit | Attending: Orthopedic Surgery | Admitting: Orthopedic Surgery

## 2022-05-18 VITALS — BP 141/76 | HR 74 | Temp 98.6°F | Resp 16 | Ht 65.0 in | Wt 170.0 lb

## 2022-05-18 DIAGNOSIS — I10 Essential (primary) hypertension: Secondary | ICD-10-CM | POA: Insufficient documentation

## 2022-05-18 DIAGNOSIS — Z01812 Encounter for preprocedural laboratory examination: Secondary | ICD-10-CM | POA: Insufficient documentation

## 2022-05-18 DIAGNOSIS — M1611 Unilateral primary osteoarthritis, right hip: Secondary | ICD-10-CM | POA: Insufficient documentation

## 2022-05-18 DIAGNOSIS — Z01818 Encounter for other preprocedural examination: Secondary | ICD-10-CM

## 2022-05-18 LAB — CBC
HCT: 40.8 % (ref 36.0–46.0)
Hemoglobin: 13.3 g/dL (ref 12.0–15.0)
MCH: 29.4 pg (ref 26.0–34.0)
MCHC: 32.6 g/dL (ref 30.0–36.0)
MCV: 90.1 fL (ref 80.0–100.0)
Platelets: 227 10*3/uL (ref 150–400)
RBC: 4.53 MIL/uL (ref 3.87–5.11)
RDW: 11.9 % (ref 11.5–15.5)
WBC: 6 10*3/uL (ref 4.0–10.5)
nRBC: 0 % (ref 0.0–0.2)

## 2022-05-18 LAB — BASIC METABOLIC PANEL
Anion gap: 9 (ref 5–15)
BUN: 13 mg/dL (ref 8–23)
CO2: 27 mmol/L (ref 22–32)
Calcium: 9.4 mg/dL (ref 8.9–10.3)
Chloride: 105 mmol/L (ref 98–111)
Creatinine, Ser: 1.12 mg/dL — ABNORMAL HIGH (ref 0.44–1.00)
GFR, Estimated: 53 mL/min — ABNORMAL LOW (ref >60)
Glucose, Bld: 114 mg/dL — ABNORMAL HIGH (ref 70–99)
Potassium: 4.2 mmol/L (ref 3.5–5.1)
Sodium: 141 mmol/L (ref 135–145)

## 2022-05-18 LAB — SURGICAL PCR SCREEN
MRSA, PCR: NEGATIVE
Staphylococcus aureus: POSITIVE — AB

## 2022-05-20 ENCOUNTER — Other Ambulatory Visit: Payer: Self-pay | Admitting: Family Medicine

## 2022-05-27 ENCOUNTER — Encounter (HOSPITAL_COMMUNITY)
Admission: RE | Disposition: A | Discharge status: Home / Self Care | Payer: Self-pay | Source: Home / Self Care | Attending: Orthopedic Surgery

## 2022-05-27 ENCOUNTER — Ambulatory Visit (HOSPITAL_BASED_OUTPATIENT_CLINIC_OR_DEPARTMENT_OTHER): Payer: Medicare Other | Admitting: Certified Registered Nurse Anesthetist

## 2022-05-27 ENCOUNTER — Observation Stay (HOSPITAL_COMMUNITY)
Admission: RE | Admit: 2022-05-27 | Discharge: 2022-05-28 | Disposition: A | Discharge status: Home / Self Care | Payer: Medicare Other | Attending: Orthopedic Surgery | Admitting: Orthopedic Surgery

## 2022-05-27 ENCOUNTER — Encounter (HOSPITAL_COMMUNITY): Payer: Self-pay | Admitting: Orthopedic Surgery

## 2022-05-27 ENCOUNTER — Ambulatory Visit (HOSPITAL_COMMUNITY): Payer: Medicare Other

## 2022-05-27 ENCOUNTER — Other Ambulatory Visit: Payer: Self-pay

## 2022-05-27 ENCOUNTER — Observation Stay (HOSPITAL_COMMUNITY): Payer: Medicare Other

## 2022-05-27 ENCOUNTER — Ambulatory Visit (HOSPITAL_COMMUNITY): Payer: Medicare Other | Admitting: Physician Assistant

## 2022-05-27 DIAGNOSIS — M1611 Unilateral primary osteoarthritis, right hip: Secondary | ICD-10-CM | POA: Diagnosis not present

## 2022-05-27 DIAGNOSIS — Z8673 Personal history of transient ischemic attack (TIA), and cerebral infarction without residual deficits: Secondary | ICD-10-CM | POA: Insufficient documentation

## 2022-05-27 DIAGNOSIS — Z96641 Presence of right artificial hip joint: Secondary | ICD-10-CM | POA: Diagnosis not present

## 2022-05-27 DIAGNOSIS — Z7982 Long term (current) use of aspirin: Secondary | ICD-10-CM | POA: Insufficient documentation

## 2022-05-27 DIAGNOSIS — Z79899 Other long term (current) drug therapy: Secondary | ICD-10-CM | POA: Insufficient documentation

## 2022-05-27 DIAGNOSIS — I10 Essential (primary) hypertension: Secondary | ICD-10-CM | POA: Insufficient documentation

## 2022-05-27 DIAGNOSIS — Z471 Aftercare following joint replacement surgery: Secondary | ICD-10-CM | POA: Diagnosis not present

## 2022-05-27 DIAGNOSIS — M25551 Pain in right hip: Secondary | ICD-10-CM | POA: Diagnosis not present

## 2022-05-27 DIAGNOSIS — M1711 Unilateral primary osteoarthritis, right knee: Secondary | ICD-10-CM | POA: Insufficient documentation

## 2022-05-27 HISTORY — PX: TOTAL HIP ARTHROPLASTY: SHX124

## 2022-05-27 LAB — TYPE AND SCREEN
ABO/RH(D): O POS
Antibody Screen: NEGATIVE

## 2022-05-27 SURGERY — ARTHROPLASTY, HIP, TOTAL, ANTERIOR APPROACH
Anesthesia: Spinal | Site: Hip | Laterality: Right

## 2022-05-27 MED ORDER — ORAL CARE MOUTH RINSE: 15.0000 mL | Freq: Once | OROMUCOSAL | Status: AC

## 2022-05-27 MED ORDER — ONDANSETRON HCL 4 MG/2ML IJ SOLN
4.0000 mg | Freq: Once | INTRAMUSCULAR | Status: AC | PRN
  Administered 2022-05-27: 4 mg via INTRAVENOUS

## 2022-05-27 MED ORDER — METOCLOPRAMIDE HCL 5 MG PO TABS: 5.0000 mg | ORAL_TABLET | Freq: Three times a day (TID) | ORAL | Status: DC | PRN

## 2022-05-27 MED ORDER — LISINOPRIL 20 MG PO TABS
20.0000 mg | ORAL_TABLET | Freq: Every day | ORAL | Status: DC
  Filled 2022-05-27: qty 1

## 2022-05-27 MED ORDER — DEXAMETHASONE SODIUM PHOSPHATE 10 MG/ML IJ SOLN
INTRAMUSCULAR | Status: DC | PRN
  Administered 2022-05-27: 10 mg via INTRAVENOUS

## 2022-05-27 MED ORDER — KETOROLAC TROMETHAMINE 30 MG/ML IJ SOLN
INTRAMUSCULAR | Status: AC
  Filled 2022-05-27: qty 1

## 2022-05-27 MED ORDER — METOCLOPRAMIDE HCL 5 MG/ML IJ SOLN
5.0000 mg | Freq: Three times a day (TID) | INTRAMUSCULAR | Status: DC | PRN
  Administered 2022-05-27: 10 mg via INTRAVENOUS
  Filled 2022-05-27: qty 2

## 2022-05-27 MED ORDER — SUGAMMADEX SODIUM 200 MG/2ML IV SOLN
INTRAVENOUS | Status: DC | PRN
  Administered 2022-05-27: 200 mg via INTRAVENOUS

## 2022-05-27 MED ORDER — CELECOXIB 200 MG PO CAPS
200.0000 mg | ORAL_CAPSULE | Freq: Two times a day (BID) | ORAL | Status: DC
  Administered 2022-05-27 – 2022-05-28 (×3): 200 mg via ORAL
  Filled 2022-05-27 (×3): qty 1

## 2022-05-27 MED ORDER — ONDANSETRON HCL 4 MG PO TABS: 4.0000 mg | ORAL_TABLET | Freq: Four times a day (QID) | ORAL | Status: DC | PRN

## 2022-05-27 MED ORDER — SODIUM CHLORIDE (PF) 0.9 % IJ SOLN
INTRAMUSCULAR | Status: DC | PRN
  Administered 2022-05-27: 30 mL

## 2022-05-27 MED ORDER — TRANEXAMIC ACID-NACL 1000-0.7 MG/100ML-% IV SOLN
1000.0000 mg | INTRAVENOUS | Status: AC
  Administered 2022-05-27: 1000 mg via INTRAVENOUS
  Filled 2022-05-27: qty 100

## 2022-05-27 MED ORDER — HYDROMORPHONE HCL 1 MG/ML IJ SOLN
INTRAMUSCULAR | Status: AC
  Filled 2022-05-27: qty 1

## 2022-05-27 MED ORDER — 0.9 % SODIUM CHLORIDE (POUR BTL) OPTIME
TOPICAL | Status: DC | PRN
  Administered 2022-05-27 (×2): 1000 mL

## 2022-05-27 MED ORDER — POVIDONE-IODINE 10 % EX SWAB
2.0000 | Freq: Once | CUTANEOUS | Status: AC
  Administered 2022-05-27: 2 via TOPICAL

## 2022-05-27 MED ORDER — DEXAMETHASONE SODIUM PHOSPHATE 10 MG/ML IJ SOLN
10.0000 mg | Freq: Once | INTRAMUSCULAR | Status: AC
  Administered 2022-05-28: 10 mg via INTRAVENOUS
  Filled 2022-05-27: qty 1

## 2022-05-27 MED ORDER — FENTANYL CITRATE (PF) 100 MCG/2ML IJ SOLN
INTRAMUSCULAR | Status: AC
  Filled 2022-05-27: qty 2

## 2022-05-27 MED ORDER — ACETAMINOPHEN 325 MG PO TABS
325.0000 mg | ORAL_TABLET | Freq: Four times a day (QID) | ORAL | Status: DC | PRN
  Administered 2022-05-28: 650 mg via ORAL
  Filled 2022-05-27: qty 2

## 2022-05-27 MED ORDER — ROSUVASTATIN CALCIUM 20 MG PO TABS
40.0000 mg | ORAL_TABLET | Freq: Every day | ORAL | Status: DC
  Administered 2022-05-27: 40 mg via ORAL
  Filled 2022-05-27: qty 2

## 2022-05-27 MED ORDER — CEFAZOLIN SODIUM-DEXTROSE 2-4 GM/100ML-% IV SOLN
2.0000 g | INTRAVENOUS | Status: AC
  Administered 2022-05-27: 2 g via INTRAVENOUS
  Filled 2022-05-27: qty 100

## 2022-05-27 MED ORDER — BUPIVACAINE-EPINEPHRINE (PF) 0.25% -1:200000 IJ SOLN
INTRAMUSCULAR | Status: DC | PRN
  Administered 2022-05-27: 30 mL via PERINEURAL

## 2022-05-27 MED ORDER — LACTATED RINGERS IV SOLN: INTRAVENOUS | Status: DC

## 2022-05-27 MED ORDER — HYDROMORPHONE HCL 1 MG/ML IJ SOLN
0.2500 mg | INTRAMUSCULAR | Status: DC | PRN
  Administered 2022-05-27: 0.5 mg via INTRAVENOUS

## 2022-05-27 MED ORDER — CHLORHEXIDINE GLUCONATE 0.12 % MT SOLN
15.0000 mL | Freq: Once | OROMUCOSAL | Status: AC
  Administered 2022-05-27: 15 mL via OROMUCOSAL

## 2022-05-27 MED ORDER — TRANEXAMIC ACID-NACL 1000-0.7 MG/100ML-% IV SOLN
1000.0000 mg | Freq: Once | INTRAVENOUS | Status: AC
  Administered 2022-05-27: 1000 mg via INTRAVENOUS
  Filled 2022-05-27: qty 100

## 2022-05-27 MED ORDER — ROCURONIUM BROMIDE 100 MG/10ML IV SOLN
INTRAVENOUS | Status: DC | PRN
  Administered 2022-05-27: 50 mg via INTRAVENOUS

## 2022-05-27 MED ORDER — SODIUM CHLORIDE 0.9 % IV SOLN: INTRAVENOUS | Status: DC

## 2022-05-27 MED ORDER — HYDROCODONE-ACETAMINOPHEN 5-325 MG PO TABS
1.0000 | ORAL_TABLET | ORAL | Status: DC | PRN
  Administered 2022-05-27 – 2022-05-28 (×3): 2 via ORAL
  Filled 2022-05-27 (×3): qty 2

## 2022-05-27 MED ORDER — MENTHOL 3 MG MT LOZG: 1.0000 | LOZENGE | OROMUCOSAL | Status: DC | PRN

## 2022-05-27 MED ORDER — DOCUSATE SODIUM 100 MG PO CAPS
100.0000 mg | ORAL_CAPSULE | Freq: Two times a day (BID) | ORAL | Status: DC
  Administered 2022-05-27 (×2): 100 mg via ORAL
  Filled 2022-05-27 (×3): qty 1

## 2022-05-27 MED ORDER — PROPOFOL 10 MG/ML IV BOLUS
INTRAVENOUS | Status: DC | PRN
  Administered 2022-05-27: 100 mg via INTRAVENOUS

## 2022-05-27 MED ORDER — DIPHENHYDRAMINE HCL 12.5 MG/5ML PO ELIX: 12.5000 mg | ORAL_SOLUTION | ORAL | Status: DC | PRN

## 2022-05-27 MED ORDER — MIDAZOLAM HCL 2 MG/2ML IJ SOLN
INTRAMUSCULAR | Status: AC
  Filled 2022-05-27: qty 2

## 2022-05-27 MED ORDER — ONDANSETRON HCL 4 MG/2ML IJ SOLN
INTRAMUSCULAR | Status: AC
  Filled 2022-05-27: qty 2

## 2022-05-27 MED ORDER — OXYCODONE HCL 5 MG/5ML PO SOLN: 5.0000 mg | Freq: Once | ORAL | Status: DC | PRN

## 2022-05-27 MED ORDER — OXYCODONE HCL 5 MG PO TABS: 5.0000 mg | ORAL_TABLET | Freq: Once | ORAL | Status: DC | PRN

## 2022-05-27 MED ORDER — PANTOPRAZOLE SODIUM 40 MG PO TBEC
40.0000 mg | DELAYED_RELEASE_TABLET | Freq: Every day | ORAL | Status: DC
  Administered 2022-05-27: 40 mg via ORAL
  Filled 2022-05-27 (×2): qty 1

## 2022-05-27 MED ORDER — DEXAMETHASONE SODIUM PHOSPHATE 10 MG/ML IJ SOLN: 8.0000 mg | Freq: Once | INTRAMUSCULAR | Status: DC

## 2022-05-27 MED ORDER — ONDANSETRON HCL 4 MG/2ML IJ SOLN
4.0000 mg | Freq: Four times a day (QID) | INTRAMUSCULAR | Status: DC | PRN
  Administered 2022-05-27: 4 mg via INTRAVENOUS
  Filled 2022-05-27: qty 2

## 2022-05-27 MED ORDER — SODIUM CHLORIDE (PF) 0.9 % IJ SOLN
INTRAMUSCULAR | Status: AC
  Filled 2022-05-27: qty 30

## 2022-05-27 MED ORDER — KETOROLAC TROMETHAMINE 30 MG/ML IJ SOLN
INTRAMUSCULAR | Status: DC | PRN
  Administered 2022-05-27: 30 mg

## 2022-05-27 MED ORDER — CEFAZOLIN SODIUM-DEXTROSE 2-4 GM/100ML-% IV SOLN
2.0000 g | Freq: Four times a day (QID) | INTRAVENOUS | Status: AC
  Administered 2022-05-27 (×2): 2 g via INTRAVENOUS
  Filled 2022-05-27 (×2): qty 100

## 2022-05-27 MED ORDER — GABAPENTIN 300 MG PO CAPS
600.0000 mg | ORAL_CAPSULE | Freq: Three times a day (TID) | ORAL | Status: DC
  Administered 2022-05-27 – 2022-05-28 (×3): 600 mg via ORAL
  Filled 2022-05-27 (×3): qty 2

## 2022-05-27 MED ORDER — FENTANYL CITRATE (PF) 100 MCG/2ML IJ SOLN
INTRAMUSCULAR | Status: DC | PRN
  Administered 2022-05-27: 50 ug via INTRAVENOUS
  Administered 2022-05-27: 100 ug via INTRAVENOUS
  Administered 2022-05-27: 50 ug via INTRAVENOUS

## 2022-05-27 MED ORDER — POLYETHYLENE GLYCOL 3350 17 G PO PACK
17.0000 g | PACK | Freq: Two times a day (BID) | ORAL | Status: DC
  Filled 2022-05-27 (×3): qty 1

## 2022-05-27 MED ORDER — PHENOL 1.4 % MT LIQD: 1.0000 | OROMUCOSAL | Status: DC | PRN

## 2022-05-27 MED ORDER — MIDAZOLAM HCL 5 MG/5ML IJ SOLN
INTRAMUSCULAR | Status: DC | PRN
  Administered 2022-05-27: 2 mg via INTRAVENOUS

## 2022-05-27 MED ORDER — AMLODIPINE BESYLATE 5 MG PO TABS
5.0000 mg | ORAL_TABLET | Freq: Every day | ORAL | Status: DC
  Administered 2022-05-27: 5 mg via ORAL
  Filled 2022-05-27 (×2): qty 1

## 2022-05-27 MED ORDER — DEXAMETHASONE SODIUM PHOSPHATE 10 MG/ML IJ SOLN
INTRAMUSCULAR | Status: AC
  Filled 2022-05-27: qty 1

## 2022-05-27 MED ORDER — BUPIVACAINE HCL 0.25 % IJ SOLN
INTRAMUSCULAR | Status: AC
  Filled 2022-05-27: qty 1

## 2022-05-27 MED ORDER — STERILE WATER FOR IRRIGATION IR SOLN
Status: DC | PRN
  Administered 2022-05-27: 2000 mL

## 2022-05-27 MED ORDER — HYDROCODONE-ACETAMINOPHEN 7.5-325 MG PO TABS: 1.0000 | ORAL_TABLET | ORAL | Status: DC | PRN

## 2022-05-27 MED ORDER — BISACODYL 10 MG RE SUPP: 10.0000 mg | Freq: Every day | RECTAL | Status: DC | PRN

## 2022-05-27 MED ORDER — CYCLOBENZAPRINE HCL 5 MG PO TABS
5.0000 mg | ORAL_TABLET | Freq: Three times a day (TID) | ORAL | Status: DC | PRN
  Administered 2022-05-27 – 2022-05-28 (×2): 5 mg via ORAL
  Filled 2022-05-27 (×2): qty 1

## 2022-05-27 MED ORDER — MORPHINE SULFATE (PF) 2 MG/ML IV SOLN
0.5000 mg | INTRAVENOUS | Status: DC | PRN
  Administered 2022-05-27: 1 mg via INTRAVENOUS
  Filled 2022-05-27: qty 1

## 2022-05-27 MED ORDER — ASPIRIN 81 MG PO CHEW
81.0000 mg | CHEWABLE_TABLET | Freq: Two times a day (BID) | ORAL | Status: DC
  Administered 2022-05-27 – 2022-05-28 (×2): 81 mg via ORAL
  Filled 2022-05-27 (×2): qty 1

## 2022-05-27 SURGICAL SUPPLY — 38 items
BAG COUNTER SPONGE SURGICOUNT (BAG) IMPLANT
BAG DECANTER FOR FLEXI CONT (MISCELLANEOUS) IMPLANT
BAG ZIPLOCK 12X15 (MISCELLANEOUS) IMPLANT
BLADE SAG 18X100X1.27 (BLADE) ×1 IMPLANT
COVER PERINEAL POST (MISCELLANEOUS) ×1 IMPLANT
COVER SURGICAL LIGHT HANDLE (MISCELLANEOUS) ×1 IMPLANT
CUP ACETBLR 52 OD PINNACLE (Hips) IMPLANT
DERMABOND ADVANCED .7 DNX12 (GAUZE/BANDAGES/DRESSINGS) ×1 IMPLANT
DRAPE FOOT SWITCH (DRAPES) ×1 IMPLANT
DRAPE STERI IOBAN 125X83 (DRAPES) ×1 IMPLANT
DRAPE U-SHAPE 47X51 STRL (DRAPES) ×2 IMPLANT
DRESSING AQUACEL AG SP 3.5X10 (GAUZE/BANDAGES/DRESSINGS) ×1 IMPLANT
DRSG AQUACEL AG ADV 3.5X10 (GAUZE/BANDAGES/DRESSINGS) IMPLANT
DRSG AQUACEL AG SP 3.5X10 (GAUZE/BANDAGES/DRESSINGS) ×1
DURAPREP 26ML APPLICATOR (WOUND CARE) ×1 IMPLANT
ELECT REM PT RETURN 15FT ADLT (MISCELLANEOUS) ×1 IMPLANT
GLOVE BIO SURGEON STRL SZ 6 (GLOVE) ×1 IMPLANT
GLOVE BIOGEL PI IND STRL 6.5 (GLOVE) ×1 IMPLANT
GLOVE BIOGEL PI IND STRL 7.5 (GLOVE) ×1 IMPLANT
GLOVE ORTHO TXT STRL SZ7.5 (GLOVE) ×2 IMPLANT
GOWN STRL REUS W/ TWL LRG LVL3 (GOWN DISPOSABLE) ×2 IMPLANT
GOWN STRL REUS W/TWL LRG LVL3 (GOWN DISPOSABLE) ×2
HEAD CERAMIC DELTA 36 PLUS 1.5 (Hips) IMPLANT
HOLDER FOLEY CATH W/STRAP (MISCELLANEOUS) ×1 IMPLANT
KIT TURNOVER KIT A (KITS) IMPLANT
LINER NEUTRAL 52X36MM PLUS 4 (Liner) IMPLANT
PACK ANTERIOR HIP CUSTOM (KITS) ×1 IMPLANT
SCREW 6.5MMX30MM (Screw) IMPLANT
STEM FEMORAL SZ6 HIGH ACTIS (Stem) IMPLANT
SUT MNCRL AB 4-0 PS2 18 (SUTURE) ×1 IMPLANT
SUT STRATAFIX 0 PDS 27 VIOLET (SUTURE) ×1
SUT VIC AB 1 CT1 36 (SUTURE) ×3 IMPLANT
SUT VIC AB 2-0 CT1 27 (SUTURE) ×2
SUT VIC AB 2-0 CT1 TAPERPNT 27 (SUTURE) ×2 IMPLANT
SUTURE STRATFX 0 PDS 27 VIOLET (SUTURE) ×1 IMPLANT
TRAY FOLEY MTR SLVR 16FR STAT (SET/KITS/TRAYS/PACK) IMPLANT
TUBE SUCTION HIGH CAP CLEAR NV (SUCTIONS) ×1 IMPLANT
WATER STERILE IRR 1000ML POUR (IV SOLUTION) ×1 IMPLANT
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
## 2022-05-27 NOTE — Interval H&P Note (Signed)
History and Physical Interval Note:  05/27/2022 9:26 AM  Andrea [name]  has presented today for surgery, with the diagnosis of Right hip osteoarthritis.  The various methods of treatment have been discussed with the patient and family. After consideration of risks, benefits and other options for treatment, the patient has consented to  Procedure(s): TOTAL HIP ARTHROPLASTY ANTERIOR APPROACH (Right) as a surgical intervention.  The patient's history has been reviewed, patient examined, no change in status, stable for surgery.  I have reviewed the patient's chart and labs.  Questions were answered to the patient's satisfaction.     Mauri Pole

## 2022-05-27 NOTE — Transfer of Care (Signed)
Immediate Anesthesia Transfer of Care Note  Patient: Andrea [name]  Procedure(s) Performed: TOTAL HIP ARTHROPLASTY ANTERIOR APPROACH (Right: Hip)  Patient Location: PACU  Anesthesia Type:General  Level of Consciousness: awake, alert , and oriented  Airway & Oxygen Therapy: Patient Spontanous Breathing and Patient connected to face mask oxygen  Post-op Assessment: Report given to RN and Post -op Vital signs reviewed and stable  Post vital signs: Reviewed and stable  Last Vitals:  Vitals Value Taken Time  BP 129/69 05/27/22 1215  Temp    Pulse 64 05/27/22 1217  Resp 15 05/27/22 1217  SpO2 100 % 05/27/22 1217  Vitals shown include unvalidated device data.  Last Pain:  Vitals:   05/27/22 0853  TempSrc:   PainSc: 0-No pain         Complications: No notable events documented.

## 2022-05-27 NOTE — Op Note (Signed)
NAME:  Andrea [name]                ACCOUNT NO.: [id number]      MEDICAL RECORD NO.: [id number]      FACILITY:  [facility]      PHYSICIAN:  Mauri Pole  DATE OF BIRTH:  1950/07/06     DATE OF PROCEDURE:  05/27/2022                                 OPERATIVE REPORT         PREOPERATIVE DIAGNOSIS: Right  hip osteoarthritis.      POSTOPERATIVE DIAGNOSIS:  Right hip osteoarthritis.      PROCEDURE:  Right total hip replacement through an anterior approach   utilizing DePuy THR system, component size 52 mm pinnacle cup, a size 36+4 neutral   Altrex liner, a size 6 Hi Actis stem with a 36+1.5 delta ceramic   ball.      SURGEON:  Pietro Cassis. Alvan Dame, M.D.      ASSISTANT:  Costella Hatcher, PA-C     ANESTHESIA:  Spinal.      SPECIMENS:  None.      COMPLICATIONS:  None.      BLOOD LOSS:  600 cc     DRAINS:  None.      INDICATION OF THE PROCEDURE:  Andrea [name] is a 72 y.o. female who had   presented to office for evaluation of right hip pain.  Radiographs revealed   progressive degenerative changes with bone-on-bone   articulation of the  hip joint, including subchondral cystic changes and osteophytes.  The patient had painful limited range of   motion significantly affecting their overall quality of life and function.  The patient was failing to    respond to conservative measures including medications and/or injections and activity modification and at this point was ready   to proceed with more definitive measures.  Consent was obtained for   benefit of pain relief.  Specific risks of infection, DVT, component   failure, dislocation, neurovascular injury, and need for revision surgery were reviewed in the office.     PROCEDURE IN DETAIL:  The patient was brought to operative theater.   Once adequate anesthesia, preoperative antibiotics, 2 gm of Ancef, 1 gm of Tranexamic Acid, and 10 mg of Decadron were administered, the patient was positioned supine on the [employer] table.  Once the patient was safely positioned with adequate padding of boney prominences we predraped out the hip, and used fluoroscopy to confirm orientation of the pelvis.      The right hip was then prepped and draped from proximal iliac crest to   mid thigh with a shower curtain technique.      Time-out was performed identifying the patient, planned procedure, and the appropriate extremity.     An incision was then made 2 cm lateral to the   anterior superior iliac spine extending over the orientation of the   tensor fascia lata muscle and sharp dissection was carried down to the   fascia of the muscle.      The fascia was then incised.  The muscle belly was identified and swept   laterally and retractor placed along the superior neck.  Following   cauterization of the circumflex vessels and removing some pericapsular   fat, a second cobra retractor was placed on the inferior neck.  A T-capsulotomy was made along the line of the   superior neck to the trochanteric fossa, then extended proximally and   distally.  Tag sutures were placed and the retractors were then placed   intracapsular.  We then identified the trochanteric fossa and   orientation of my neck cut and then made a neck osteotomy with the femur on traction.  The femoral   head was removed without difficulty or complication.  Traction was let   off and retractors were placed posterior and anterior around the   acetabulum.      The labrum and foveal tissue were debrided.  I began reaming with a 45 mm   reamer and reamed up to 51 mm reamer with good bony bed preparation and a 52 mm  cup was chosen.  The final 52 mm Pinnacle cup was then impacted under fluoroscopy to confirm the depth of penetration and orientation with respect to   Abduction and forward flexion.  A screw was placed into the ilium followed by the hole eliminator.  The final   36+4 neutral Altrex liner was impacted with good visualized rim fit.  The cup  was positioned anatomically within the acetabular portion of the pelvis.      At this point, the femur was rolled to 100 degrees.  Further capsule was   released off the inferior aspect of the femoral neck.  I then   released the superior capsule proximally.  With the leg in a neutral position the hook was placed laterally   along the femur under the vastus lateralis origin and elevated manually and then held in position using the hook attachment on the bed.  The leg was then extended and adducted with the leg rolled to 100   degrees of external rotation.  Retractors were placed along the medial calcar and posteriorly over the greater trochanter.  Once the proximal femur was fully   exposed, I used a box osteotome to set orientation.  I then began   broaching with the starting chili pepper broach and passed this by hand and then broached up to 6.  With the 6 broach in place I chose a high offset neck and did several trial reductions.  The offset was appropriate, leg lengths   appeared to be equal best matched with the +1.5 head ball trial confirmed radiographically.   Given these findings, I went ahead and dislocated the hip, repositioned all   retractors and positioned the right hip in the extended and abducted position.  The final 6 Hi Actis stem was   chosen and it was impacted down to the level of neck cut.  Based on this   and the trial reductions, a final 36+1.5 delta ceramic ball was chosen and   impacted onto a clean and dry trunnion, and the hip was reduced.  The   hip had been irrigated throughout the case again at this point.  I did   reapproximate the superior capsular leaflet to the anterior leaflet   using #1 Vicryl.  The fascia of the   tensor fascia lata muscle was then reapproximated using #1 Vicryl and #0 Stratafix sutures.  The   remaining wound was closed with 2-0 Vicryl and running 4-0 Monocryl.   The hip was cleaned, dried, and dressed sterilely using Dermabond and    Aquacel dressing.  The patient was then brought   to recovery room in stable condition tolerating the procedure well.    Costella Hatcher, PA-C  was present for the entirety of the case involved from   preoperative positioning, perioperative retractor management, general   facilitation of the case, as well as primary wound closure as assistant.            Pietro Cassis Alvan Dame, M.D.        05/27/2022 10:38 AM

## 2022-05-27 NOTE — Anesthesia Preprocedure Evaluation (Addendum)
Anesthesia Evaluation  Patient identified by MRN, date of birth, ID band Patient awake    Reviewed: Allergy & Precautions, H&P , NPO status , Patient's Chart, lab work & pertinent test results  History of Anesthesia Complications Negative for: history of anesthetic complications  Airway Mallampati: III  TM Distance: >3 FB Neck ROM: Limited   Comment: Extremely limited neck extension. Dental no notable dental hx. (+) Dental Advisory Given   Pulmonary neg pulmonary ROS   Pulmonary exam normal breath sounds clear to auscultation       Cardiovascular hypertension, Pt. 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 She declines any attempt at spinal anesthesia.  Takes aspirin 325 mg daily. Last dose a week ago.  Reproductive/Obstetrics negative OB ROS                             Anesthesia Physical Anesthesia Plan  ASA: 2  Anesthesia Plan: General   Post-op Pain Management: Ofirmev IV (intra-op)*   Induction: Intravenous  PONV Risk Score and Plan: 3 and Ondansetron, Treatment may vary due to age or medical condition and Dexamethasone  Airway Management Planned: Oral ETT and Video Laryngoscope Planned  Additional Equipment:   Intra-op Plan:   Post-operative Plan: Extubation in  OR  Informed Consent: I have reviewed the patients History and Physical, chart, labs and discussed the procedure including the risks, benefits and alternatives for the proposed anesthesia with the patient or authorized representative who has indicated his/her understanding and acceptance.     Dental advisory given  Plan Discussed with: CRNA, Surgeon and Anesthesiologist  Anesthesia Plan Comments: (Risks of general anesthesia discussed including, but not limited to, sore throat, hoarse voice, chipped/damaged teeth, injury to vocal cords, nausea and vomiting, allergic reactions, lung infection, heart attack, stroke, and death. All questions answered. )       Anesthesia Quick Evaluation

## 2022-05-27 NOTE — Evaluation (Signed)
Physical Therapy Evaluation Patient Details Name: Andrea [name] MRN: [id number] DOB: Oct 24, 1950 Today's Date: 05/27/2022  History of Present Illness  72 yo female s/p R THA, AA on 05/27/21. PMH: HTN, CVA, HLD, chronic back pain, back surgeryx3, CTR, RCR  Clinical Impression  Pt is s/p THA resulting in the deficits listed below (see PT Problem List).  Pt agreeable to mobilize, limited by nausea and dizziness  (RN gave nausea meds during PT session).  Pt able to amb a few feet in room, returned to recliner. Anticipate steady progress in acute setting.  Pt will benefit from skilled PT to increase their independence and safety with mobility to allow discharge to the venue listed below.         Recommendations for follow up therapy are one component of a multi-disciplinary discharge planning process, led by the attending physician.  Recommendations may be updated based on patient status, additional functional criteria and insurance authorization.  Follow Up Recommendations Follow physician's recommendations for discharge plan and follow up therapies      Assistance Recommended at Discharge Intermittent Supervision/Assistance  Patient can return home with the following  A little help with walking and/or transfers;A little help with bathing/dressing/bathroom;Assist for transportation;Help with stairs or ramp for entrance;Assistance with cooking/housework    Equipment Recommendations None recommended by PT  Recommendations for Other Services       Functional Status Assessment Patient has had a recent decline in their functional status and demonstrates the ability to make significant improvements in function in a reasonable and predictable amount of time.     Precautions / Restrictions Precautions Precautions: Fall Restrictions Weight Bearing Restrictions: No      Mobility  Bed Mobility Overal bed mobility: Needs Assistance Bed Mobility: Supine to Sit     Supine to sit: Min guard,  HOB elevated     General bed mobility comments: verbal cues for technique, no physical assist    Transfers Overall transfer level: Needs assistance Equipment used: Rolling walker (2 wheels) Transfers: Sit to/from Stand Sit to Stand: Min guard, Min assist           General transfer comment: light assist to rise, steady and transition to RW    Ambulation/Gait Ambulation/Gait assistance: Min guard Gait Distance (Feet): 4 Feet Assistive device: Rolling walker (2 wheels) Gait Pattern/deviations: Step-to pattern       General Gait Details: verbal cues for sequence, min/guard for safety. ltd by nausea and dizziness  Stairs            Wheelchair Mobility    Modified Rankin (Stroke Patients Only)       Balance                                             Pertinent Vitals/Pain Pain Assessment Pain Assessment: Faces Faces Pain Scale: Hurts whole lot Pain Location: righ thip Pain Descriptors / Indicators: Sore, Guarding, Grimacing Pain Intervention(s): Limited activity within patient's tolerance, Monitored during session, Premedicated before session, Repositioned    Home Living Family/patient expects to be discharged to:: Private residence Living Arrangements: Spouse/significant other Available Help at Discharge: Family Type of Home: House (townhouse) Home Access: Stairs to enter   Technical brewer of Steps: threhsold   Home Layout: One level Home Equipment: Conservation officer, nature (2 wheels);Rollator (4 wheels)      Prior Function Prior Level of Function : Independent/Modified Independent  Hand Dominance        Extremity/Trunk Assessment   Upper Extremity Assessment Upper Extremity Assessment: Overall WFL for tasks assessed    Lower Extremity Assessment Lower Extremity Assessment: RLE deficits/detail RLE Deficits / Details: ankle 4+/5, knee and hip grossly 2+ to 3/5, limtied by post op pain        Communication   Communication: No difficulties  Cognition Arousal/Alertness: Awake/alert Behavior During Therapy: WFL for tasks assessed/performed Overall Cognitive Status: Within Functional Limits for tasks assessed                                          General Comments      Exercises Total Joint Exercises Ankle Circles/Pumps: AROM, Both, 10 reps   Assessment/Plan    PT Assessment Patient needs continued PT services  PT Problem List Decreased strength;Decreased activity tolerance;Decreased balance;Decreased mobility;Pain;Decreased knowledge of use of DME       PT Treatment Interventions DME instruction;Therapeutic exercise;Gait training;Functional mobility training;Therapeutic activities;Patient/family education    PT Goals (Current goals can be found in the Care Plan section)  Acute Rehab PT Goals Patient Stated Goal: home PT Goal Formulation: With patient Time For Goal Achievement: 06/03/22 Potential to Achieve Goals: Good    Frequency 7X/week     Co-evaluation               AM-PAC PT "6 Clicks" Mobility  Outcome Measure Help needed turning from your back to your side while in a flat bed without using bedrails?: A Little Help needed moving from lying on your back to sitting on the side of a flat bed without using bedrails?: A Little Help needed moving to and from a bed to a chair (including a wheelchair)?: A Little Help needed standing up from a chair using your arms (e.g., wheelchair or bedside chair)?: A Little Help needed to walk in hospital room?: A Little Help needed climbing 3-5 steps with a railing? : A Little 6 Click Score: 18    End of Session Equipment Utilized During Treatment: Gait belt Activity Tolerance: Treatment limited secondary to medical complications (Comment) (nausea, dizziness) Patient left: in chair;with call bell/phone within reach;with chair alarm set Nurse Communication: Mobility status PT Visit Diagnosis:  Other abnormalities of gait and mobility (R26.89);Difficulty in walking, not elsewhere classified (R26.2)    Time: [id number] PT Time Calculation (min) (ACUTE ONLY): 24 min   Charges:   PT Evaluation $PT Eval Low Complexity: 1 Low PT Treatments $Gait Training: 8-22 mins        Baxter Flattery, PT  Acute Rehab Dept [facility]) [phone]  [facility] Weekend Pager [facility] only)  [phone]  05/27/2022   [facility] 05/27/2022, 3:32 PM

## 2022-05-27 NOTE — Anesthesia Postprocedure Evaluation (Signed)
Anesthesia Post Note  Patient: Andrea [name]  Procedure(s) Performed: TOTAL HIP ARTHROPLASTY ANTERIOR APPROACH (Right: Hip)     Patient location during evaluation: PACU Anesthesia Type: Spinal Level of consciousness: awake Pain management: pain level controlled Vital Signs Assessment: post-procedure vital signs reviewed and stable Respiratory status: spontaneous breathing, nonlabored ventilation and respiratory function stable Cardiovascular status: blood pressure returned to baseline and stable Postop Assessment: no apparent nausea or vomiting Anesthetic complications: no   No notable events documented.  Last Vitals:  Vitals:   05/27/22 1300 05/27/22 1320  BP: (!) 141/85 133/69  Pulse: (!) 58 61  Resp: 12 16  Temp: (!) 36.4 C 36.6 C  SpO2: 100% 98%    Last Pain:  Vitals:   05/27/22 1349  TempSrc:   PainSc: 8                  Nilda Simmer

## 2022-05-27 NOTE — Anesthesia Procedure Notes (Signed)
Procedure Name: Intubation Date/Time: 05/27/2022 10:42 AM  Performed by: [place] Manus Rudd, CRNAPre-anesthesia Checklist: Patient identified, Emergency Drugs available, Suction available and Patient being monitored Patient Re-evaluated:Patient Re-evaluated prior to induction Oxygen Delivery Method: Circle system utilized Preoxygenation: Pre-oxygenation with 100% oxygen Induction Type: IV induction Ventilation: Mask ventilation without difficulty Laryngoscope Size: Mac, 3 and Glidescope Grade View: Grade I Tube type: Oral Number of attempts: 1 Airway Equipment and Method: Stylet and Oral airway Placement Confirmation: ETT inserted through vocal cords under direct vision, positive ETCO2 and breath sounds checked- equal and bilateral Secured at: 21 cm Tube secured with: Tape Dental Injury: Teeth and Oropharynx as per pre-operative assessment  Comments: Elective glidescope due to hardware in neck- neck remained neutral throughout

## 2022-05-27 NOTE — Discharge Instructions (Addendum)

## 2022-05-28 ENCOUNTER — Encounter (HOSPITAL_COMMUNITY): Payer: Self-pay | Admitting: Orthopedic Surgery

## 2022-05-28 DIAGNOSIS — Z79899 Other long term (current) drug therapy: Secondary | ICD-10-CM | POA: Diagnosis not present

## 2022-05-28 DIAGNOSIS — I10 Essential (primary) hypertension: Secondary | ICD-10-CM | POA: Diagnosis not present

## 2022-05-28 DIAGNOSIS — Z7982 Long term (current) use of aspirin: Secondary | ICD-10-CM | POA: Diagnosis not present

## 2022-05-28 DIAGNOSIS — M25551 Pain in right hip: Secondary | ICD-10-CM | POA: Diagnosis not present

## 2022-05-28 DIAGNOSIS — Z8673 Personal history of transient ischemic attack (TIA), and cerebral infarction without residual deficits: Secondary | ICD-10-CM | POA: Diagnosis not present

## 2022-05-28 DIAGNOSIS — M1611 Unilateral primary osteoarthritis, right hip: Secondary | ICD-10-CM | POA: Diagnosis not present

## 2022-05-28 LAB — BASIC METABOLIC PANEL
Anion gap: 8 (ref 5–15)
BUN: 16 mg/dL (ref 8–23)
CO2: 24 mmol/L (ref 22–32)
Calcium: 8.3 mg/dL — ABNORMAL LOW (ref 8.9–10.3)
Chloride: 103 mmol/L (ref 98–111)
Creatinine, Ser: 1.04 mg/dL — ABNORMAL HIGH (ref 0.44–1.00)
GFR, Estimated: 58 mL/min — ABNORMAL LOW (ref >60)
Glucose, Bld: 146 mg/dL — ABNORMAL HIGH (ref 70–99)
Potassium: 4.1 mmol/L (ref 3.5–5.1)
Sodium: 135 mmol/L (ref 135–145)

## 2022-05-28 LAB — CBC
HCT: 29 % — ABNORMAL LOW (ref 36.0–46.0)
Hemoglobin: 9.7 g/dL — ABNORMAL LOW (ref 12.0–15.0)
MCH: 29.5 pg (ref 26.0–34.0)
MCHC: 33.4 g/dL (ref 30.0–36.0)
MCV: 88.1 fL (ref 80.0–100.0)
Platelets: 173 10*3/uL (ref 150–400)
RBC: 3.29 MIL/uL — ABNORMAL LOW (ref 3.87–5.11)
RDW: 11.8 % (ref 11.5–15.5)
WBC: 14.8 10*3/uL — ABNORMAL HIGH (ref 4.0–10.5)
nRBC: 0 % (ref 0.0–0.2)

## 2022-05-28 MED ORDER — CYCLOBENZAPRINE HCL 5 MG PO TABS: 5.0000 mg | ORAL_TABLET | Freq: Three times a day (TID) | ORAL | 1 refills | Status: DC | PRN

## 2022-05-28 MED ORDER — SENNA 8.6 MG PO TABS: 1.0000 | ORAL_TABLET | Freq: Every day | ORAL | 0 refills | Status: AC

## 2022-05-28 MED ORDER — POLYETHYLENE GLYCOL 3350 17 G PO PACK: 17.0000 g | PACK | Freq: Two times a day (BID) | ORAL | 0 refills | Status: DC

## 2022-05-28 MED ORDER — HYDROCODONE-ACETAMINOPHEN 5-325 MG PO TABS: 1.0000 | ORAL_TABLET | ORAL | 0 refills | Status: DC | PRN

## 2022-05-28 MED ORDER — CELECOXIB 200 MG PO CAPS: 200.0000 mg | ORAL_CAPSULE | Freq: Two times a day (BID) | ORAL | 0 refills | Status: DC

## 2022-05-28 MED ORDER — ASPIRIN 81 MG PO CHEW: 81.0000 mg | CHEWABLE_TABLET | Freq: Two times a day (BID) | ORAL | 0 refills | Status: AC
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
## 2022-05-28 NOTE — Progress Notes (Signed)
Patient discharged to home w/ family. Given all belongings, instructions. Verbalized understanding of all instructions. Escorted to pov via w/c. 

## 2022-05-28 NOTE — Progress Notes (Signed)
Physical Therapy Treatment Patient Details Name: Andrea [name] MRN: [id number] DOB: [birth year] Today's Date: 05/28/2022   History of Present Illness 72 yo female s/p R THA, AA on 05/27/21. PMH: HTN, CVA, HLD, chronic back pain, back surgeryx3, CTR, RCR    PT Comments    Pt is s/p R anterior THA resulting in the deficits listed below (see PT Problem List).  Pt will benefit from skilled PT to increase their independence and safety with mobility to allow discharge home with family and social support. Pt reports she is feeling much better this am and is ready to go home. PT able to guide pt though LE HEP including supine, seated and standing TE to progress with R LE strength and ROM, HO provided and pt performed return demonstration with minimal cues. Pt amb in hallway with RW and S 110 feet with good reciprocal pattern, Les passing in stance phase with slow cadence, S for transfer tasks to bed and recliner with min cues for proper UE and AD placement and bed mobility with S. Pt is to follow up with Dr. Alvan Dame in 2 wks. Pt ed provided on safety, fall risk prevention, use of RW in home setting and pain management.   Recommendations for follow up therapy are one component of a multi-disciplinary discharge planning process, led by the attending physician.  Recommendations may be updated based on patient status, additional functional criteria and insurance authorization.  Follow Up Recommendations  Follow physician's recommendations for discharge plan and follow up therapies     Assistance Recommended at Discharge Intermittent Supervision/Assistance  Patient can return home with the following A little help with walking and/or transfers;A little help with bathing/dressing/bathroom;Assistance with cooking/housework;Assist for transportation;Help with stairs or ramp for entrance   Equipment Recommendations  Other (comment) (pt reports having DME in home setting)    Recommendations for Other Services        Precautions / Restrictions Precautions Precautions: Fall Restrictions Weight Bearing Restrictions: No RLE Weight Bearing: Weight bearing as tolerated     Mobility  Bed Mobility Overal bed mobility: Needs Assistance Bed Mobility: Supine to Sit, Sit to Supine     Supine to sit: Supervision Sit to supine: Supervision   General bed mobility comments: verbal cues for technique, no physical assist    Transfers   Equipment used: Rolling walker (2 wheels) Transfers: Sit to/from Stand Sit to Stand: Supervision           General transfer comment: cues for proper hand placement    Ambulation/Gait Ambulation/Gait assistance: Supervision Gait Distance (Feet): 110 Feet Assistive device: Rolling walker (2 wheels) Gait Pattern/deviations: Step-through pattern Gait velocity: decreaed     General Gait Details: no reports of dizziness or nausea today. pt required min verbal cues for sequencing and RW placement   Stairs             Wheelchair Mobility    Modified Rankin (Stroke Patients Only)       Balance Overall balance assessment: Needs assistance Sitting-balance support: No upper extremity supported, Feet unsupported Sitting balance-Leahy Scale: Fair     Standing balance support: No upper extremity supported Standing balance-Leahy Scale: Fair                              Cognition Arousal/Alertness: Awake/alert Behavior During Therapy: WFL for tasks assessed/performed Overall Cognitive Status: Within Functional Limits for tasks assessed  Exercises Total Joint Exercises Ankle Circles/Pumps: AROM, Both, 20 reps Quad Sets: AROM, Right, 5 reps Short Arc Quad: AROM, Right, 5 reps Heel Slides: AROM, Right, 5 reps Hip ABduction/ADduction: AROM, Right, 5 reps Long Arc Quad: AROM, Right, 5 reps, Seated Knee Flexion: AROM, Right, 5 reps, Standing Marching in Standing: AROM, Right, 5  reps Standing Hip Extension: AROM, Right, 5 reps    General Comments        Pertinent Vitals/Pain Pain Assessment Pain Assessment: 0-10 Pain Score: 7  Pain Location: R hip Pain Descriptors / Indicators: Discomfort, Guarding Pain Intervention(s): Repositioned, Monitored during session, Patient requesting pain meds-RN notified, Ice applied    Home Living Family/patient expects to be discharged to:: Private residence Living Arrangements: Spouse/significant other Available Help at Discharge: Family Type of Home: House Home Access: Stairs to enter Entrance Stairs-Rails: Right Entrance Stairs-Number of Steps: threhsold   Home Layout: One level Home Equipment: Conservation officer, nature (2 wheels);Rollator (4 wheels)      Prior Function            PT Goals (current goals can now be found in the care plan section) Acute Rehab PT Goals Patient Stated Goal:  (pain manaement and resume driving with pt drected to communicte with MD) PT Goal Formulation: With patient Time For Goal Achievement: 06/03/22 Potential to Achieve Goals: Good    Frequency    7X/week      PT Plan      Co-evaluation              AM-PAC PT "6 Clicks" Mobility   Outcome Measure  Help needed turning from your back to your side while in a flat bed without using bedrails?: None Help needed moving from lying on your back to sitting on the side of a flat bed without using bedrails?: A Little Help needed moving to and from a bed to a chair (including a wheelchair)?: A Little Help needed standing up from a chair using your arms (e.g., wheelchair or bedside chair)?: A Little Help needed to walk in hospital room?: A Little Help needed climbing 3-5 steps with a railing? : A Little 6 Click Score: 19    End of Session Equipment Utilized During Treatment: Gait belt Activity Tolerance: Patient tolerated treatment well Patient left: in bed;with call bell/phone within reach Nurse Communication: Mobility  status;Other (comment);Patient requests pain meds (progression toward d/c home) PT Visit Diagnosis: Other abnormalities of gait and mobility (R26.89);Unsteadiness on feet (R26.81);Muscle weakness (generalized) (M62.81);Pain Pain - Right/Left: Right Pain - part of body: Hip     Time: [id number] PT Time Calculation (min) (ACUTE ONLY): 26 min  Charges:  $Gait Training: 8-22 mins $Therapeutic Exercise: 8-22 mins                    Baird Lyons, PT    Adair Patter 05/28/2022, 10:25 AM

## 2022-05-28 NOTE — TOC Transition Note (Signed)
Transition of Care [facility]) - CM/SW Discharge Note   Patient Details  Name: Andrea [name] MRN: [id number] Date of Birth: 1950/08/16  Transition of [facility]) CM/SW Contact:  Henrietta Dine, RN Phone Number: 05/28/2022, 9:52 AM   Clinical Narrative:    Spoke w/ pt; she says she plans to return home at d/c; pt confirms HEP and no DME needs; no further TOC needs.   Final next level of care: Home/Self Care Barriers to Discharge: No Barriers Identified   Patient Goals and CMS Choice      Discharge Placement                         Discharge Plan and Services Additional resources added to the After Visit Summary for                                       Social Determinants of Health (SDOH) Interventions SDOH Screenings   Food Insecurity: No Food Insecurity (05/27/2022)  Housing: Low Risk  (05/27/2022)  Transportation Needs: No Transportation Needs (05/27/2022)  Utilities: Not At Risk (05/27/2022)  Alcohol Screen: Low Risk  (03/22/2022)  Depression (PHQ2-9): Low Risk  (03/22/2022)  Financial Resource Strain: Low Risk  (03/22/2022)  Physical Activity: Sufficiently Active (03/22/2022)  Social Connections: Moderately Isolated (03/22/2022)  Stress: No Stress Concern Present (03/22/2022)  Tobacco Use: Low Risk  (05/27/2022)     Readmission Risk Interventions     No data to display
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
## 2022-06-09 ENCOUNTER — Encounter: Payer: Self-pay | Admitting: Family Medicine

## 2022-06-10 MED ORDER — ROSUVASTATIN CALCIUM 40 MG PO TABS: 40.0000 mg | ORAL_TABLET | Freq: Every day | ORAL | 3 refills | Status: DC

## 2022-06-21 DIAGNOSIS — Z124 Encounter for screening for malignant neoplasm of cervix: Secondary | ICD-10-CM | POA: Diagnosis not present

## 2022-06-21 DIAGNOSIS — Z1151 Encounter for screening for human papillomavirus (HPV): Secondary | ICD-10-CM | POA: Diagnosis not present

## 2022-06-21 DIAGNOSIS — Z01419 Encounter for gynecological examination (general) (routine) without abnormal findings: Secondary | ICD-10-CM | POA: Diagnosis not present

## 2022-06-21 DIAGNOSIS — Z1231 Encounter for screening mammogram for malignant neoplasm of breast: Secondary | ICD-10-CM | POA: Diagnosis not present

## 2022-06-21 DIAGNOSIS — Z6828 Body mass index (BMI) 28.0-28.9, adult: Secondary | ICD-10-CM | POA: Diagnosis not present

## 2022-07-05 ENCOUNTER — Encounter: Payer: Self-pay | Admitting: Family Medicine

## 2022-07-06 ENCOUNTER — Other Ambulatory Visit: Payer: Self-pay | Admitting: Adult Health

## 2022-07-06 MED ORDER — BUTALBITAL-APAP-CAFFEINE 50-325-40 MG PO TABS: 1.0000 | ORAL_TABLET | Freq: Four times a day (QID) | ORAL | 0 refills | Status: DC | PRN

## 2022-07-09 DIAGNOSIS — Z96641 Presence of right artificial hip joint: Secondary | ICD-10-CM | POA: Diagnosis not present

## 2022-07-09 DIAGNOSIS — Z471 Aftercare following joint replacement surgery: Secondary | ICD-10-CM | POA: Diagnosis not present

## 2022-07-27 ENCOUNTER — Encounter: Payer: Self-pay | Admitting: Family Medicine

## 2022-07-28 MED ORDER — LISINOPRIL 20 MG PO TABS: 20.0000 mg | ORAL_TABLET | Freq: Every day | ORAL | 1 refills | Status: DC

## 2022-08-10 ENCOUNTER — Other Ambulatory Visit: Payer: Self-pay | Admitting: Family Medicine

## 2022-08-10 DIAGNOSIS — I10 Essential (primary) hypertension: Secondary | ICD-10-CM

## 2022-08-20 ENCOUNTER — Other Ambulatory Visit: Payer: Self-pay | Admitting: Family Medicine

## 2022-08-20 DIAGNOSIS — M25519 Pain in unspecified shoulder: Secondary | ICD-10-CM

## 2022-09-07 ENCOUNTER — Encounter: Payer: Self-pay | Admitting: Family Medicine

## 2022-09-08 MED ORDER — CYCLOBENZAPRINE HCL 5 MG PO TABS: 5.0000 mg | ORAL_TABLET | Freq: Three times a day (TID) | ORAL | 2 refills | Status: DC | PRN

## 2022-09-09 ENCOUNTER — Encounter: Payer: Self-pay | Admitting: Family Medicine

## 2022-09-16 ENCOUNTER — Encounter: Payer: Self-pay | Admitting: Family Medicine

## 2022-09-22 ENCOUNTER — Telehealth: Payer: Self-pay | Admitting: Family Medicine

## 2022-09-22 ENCOUNTER — Encounter: Payer: Self-pay | Admitting: Family Medicine

## 2022-09-22 NOTE — Telephone Encounter (Addendum)
Pt's spouse will be here on Tuesday, 10/05/22 for an OV at 2:30 pm.  Pt would like to know if it is possible for her to come in and see MD on this day, as well?  (Pt has already been scheduled for Wednesday, 10/06/22 at 11:30 am.)  MD does have a 15 minute slot available right after the 2:30 pm with Pt's spouse on 10/05/22, but Pt is 72 years old.  Please advise.

## 2022-09-23 NOTE — Telephone Encounter (Signed)
Called Pt to schedule.   Pt changed her mind and will come in for her OV on 10/06/22, instead.

## 2022-09-23 NOTE — Telephone Encounter (Signed)
Attempted to reach pt. Left a voicemail to call us back.  

## 2022-10-06 ENCOUNTER — Encounter: Payer: Self-pay | Admitting: Family Medicine

## 2022-10-06 ENCOUNTER — Ambulatory Visit (INDEPENDENT_AMBULATORY_CARE_PROVIDER_SITE_OTHER): Payer: Medicare Other | Admitting: Family Medicine

## 2022-10-06 VITALS — BP 122/70 | HR 97 | Temp 98.1°F | Ht 65.0 in | Wt 171.0 lb

## 2022-10-06 DIAGNOSIS — I10 Essential (primary) hypertension: Secondary | ICD-10-CM

## 2022-10-06 DIAGNOSIS — W57XXXA Bitten or stung by nonvenomous insect and other nonvenomous arthropods, initial encounter: Secondary | ICD-10-CM

## 2022-10-06 DIAGNOSIS — S40262A Insect bite (nonvenomous) of left shoulder, initial encounter: Secondary | ICD-10-CM | POA: Diagnosis not present

## 2022-10-06 DIAGNOSIS — F439 Reaction to severe stress, unspecified: Secondary | ICD-10-CM | POA: Diagnosis not present

## 2022-10-06 DIAGNOSIS — E785 Hyperlipidemia, unspecified: Secondary | ICD-10-CM | POA: Diagnosis not present
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
## 2022-10-27 ENCOUNTER — Encounter: Payer: Self-pay | Admitting: Family Medicine

## 2022-10-28 MED ORDER — BUTALBITAL-APAP-CAFFEINE 50-325-40 MG PO TABS: 1.0000 | ORAL_TABLET | Freq: Four times a day (QID) | ORAL | 0 refills | Status: DC | PRN

## 2022-11-17 DIAGNOSIS — L814 Other melanin hyperpigmentation: Secondary | ICD-10-CM | POA: Diagnosis not present

## 2022-11-17 DIAGNOSIS — D225 Melanocytic nevi of trunk: Secondary | ICD-10-CM | POA: Diagnosis not present

## 2022-11-17 DIAGNOSIS — D485 Neoplasm of uncertain behavior of skin: Secondary | ICD-10-CM | POA: Diagnosis not present

## 2022-11-17 DIAGNOSIS — C44321 Squamous cell carcinoma of skin of nose: Secondary | ICD-10-CM | POA: Diagnosis not present

## 2022-11-17 DIAGNOSIS — L821 Other seborrheic keratosis: Secondary | ICD-10-CM | POA: Diagnosis not present

## 2022-11-25 ENCOUNTER — Encounter (INDEPENDENT_AMBULATORY_CARE_PROVIDER_SITE_OTHER): Payer: Self-pay

## 2022-11-29 DIAGNOSIS — D0439 Carcinoma in situ of skin of other parts of face: Secondary | ICD-10-CM | POA: Diagnosis not present

## 2022-11-29 DIAGNOSIS — L218 Other seborrheic dermatitis: Secondary | ICD-10-CM | POA: Diagnosis not present

## 2022-12-06 ENCOUNTER — Encounter: Payer: Self-pay | Admitting: Family Medicine

## 2022-12-06 DIAGNOSIS — I10 Essential (primary) hypertension: Secondary | ICD-10-CM

## 2022-12-07 MED ORDER — AMLODIPINE BESYLATE 5 MG PO TABS: ORAL_TABLET | ORAL | 1 refills | Status: DC

## 2022-12-07 MED ORDER — ROSUVASTATIN CALCIUM 40 MG PO TABS: 40.0000 mg | ORAL_TABLET | Freq: Every day | ORAL | 1 refills | Status: DC

## 2022-12-07 MED ORDER — LISINOPRIL 20 MG PO TABS: 20.0000 mg | ORAL_TABLET | Freq: Every day | ORAL | 1 refills | Status: DC

## 2022-12-13 ENCOUNTER — Encounter: Payer: Self-pay | Admitting: Family Medicine

## 2022-12-14 DIAGNOSIS — L817 Pigmented purpuric dermatosis: Secondary | ICD-10-CM | POA: Diagnosis not present

## 2022-12-15 MED ORDER — CYCLOBENZAPRINE HCL 5 MG PO TABS: 5.0000 mg | ORAL_TABLET | Freq: Three times a day (TID) | ORAL | 2 refills | Status: DC | PRN

## 2023-01-03 ENCOUNTER — Ambulatory Visit (INDEPENDENT_AMBULATORY_CARE_PROVIDER_SITE_OTHER): Payer: Medicare Other | Admitting: Family Medicine

## 2023-01-03 ENCOUNTER — Encounter: Payer: Self-pay | Admitting: Family Medicine

## 2023-01-03 VITALS — BP 118/64 | HR 90 | Temp 98.1°F | Ht 65.0 in | Wt 171.5 lb

## 2023-01-03 DIAGNOSIS — Z13220 Encounter for screening for lipoid disorders: Secondary | ICD-10-CM

## 2023-01-03 DIAGNOSIS — Z0000 Encounter for general adult medical examination without abnormal findings: Secondary | ICD-10-CM | POA: Diagnosis not present

## 2023-01-03 DIAGNOSIS — Z23 Encounter for immunization: Secondary | ICD-10-CM | POA: Diagnosis not present

## 2023-01-03 LAB — HEPATIC FUNCTION PANEL
ALT: 12 U/L (ref 0–35)
AST: 17 U/L (ref 0–37)
Albumin: 4.3 g/dL (ref 3.5–5.2)
Alkaline Phosphatase: 90 U/L (ref 39–117)
Bilirubin, Direct: 0.1 mg/dL (ref 0.0–0.3)
Total Bilirubin: 0.5 mg/dL (ref 0.2–1.2)
Total Protein: 6.6 g/dL (ref 6.0–8.3)

## 2023-01-03 LAB — CBC WITH DIFFERENTIAL/PLATELET
Basophils Absolute: 0.1 10*3/uL (ref 0.0–0.1)
Basophils Relative: 1.1 % (ref 0.0–3.0)
Eosinophils Absolute: 0.3 10*3/uL (ref 0.0–0.7)
Eosinophils Relative: 5.1 % — ABNORMAL HIGH (ref 0.0–5.0)
HCT: 39.2 % (ref 36.0–46.0)
Hemoglobin: 12.9 g/dL (ref 12.0–15.0)
Lymphocytes Relative: 28.7 % (ref 12.0–46.0)
Lymphs Abs: 1.7 10*3/uL (ref 0.7–4.0)
MCHC: 32.8 g/dL (ref 30.0–36.0)
MCV: 86.3 fl (ref 78.0–100.0)
Monocytes Absolute: 0.4 10*3/uL (ref 0.1–1.0)
Monocytes Relative: 7.4 % (ref 3.0–12.0)
Neutro Abs: 3.4 10*3/uL (ref 1.4–7.7)
Neutrophils Relative %: 57.7 % (ref 43.0–77.0)
Platelets: 239 10*3/uL (ref 150.0–400.0)
RBC: 4.54 Mil/uL (ref 3.87–5.11)
RDW: 13 % (ref 11.5–15.5)
WBC: 5.9 10*3/uL (ref 4.0–10.5)

## 2023-01-03 LAB — LIPID PANEL
Cholesterol: 122 mg/dL (ref 0–200)
HDL: 47.8 mg/dL (ref >39.00)
LDL Cholesterol: 50 mg/dL (ref 0–99)
NonHDL: 74.68
Total CHOL/HDL Ratio: 3
Triglycerides: 124 mg/dL (ref 0.0–149.0)
VLDL: 24.8 mg/dL (ref 0.0–40.0)

## 2023-01-03 LAB — BASIC METABOLIC PANEL
BUN: 13 mg/dL (ref 6–23)
CO2: 29 mEq/L (ref 19–32)
Calcium: 9.5 mg/dL (ref 8.4–10.5)
Chloride: 105 mEq/L (ref 96–112)
Creatinine, Ser: 1.1 mg/dL (ref 0.40–1.20)
GFR: 50.59 mL/min — ABNORMAL LOW (ref >60.00)
Glucose, Bld: 109 mg/dL — ABNORMAL HIGH (ref 70–99)
Potassium: 4.1 mEq/L (ref 3.5–5.1)
Sodium: 142 mEq/L (ref 135–145)
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
## 2023-01-10 ENCOUNTER — Ambulatory Visit: Payer: Medicare Other

## 2023-01-10 ENCOUNTER — Encounter: Payer: Self-pay | Admitting: Family Medicine

## 2023-01-11 MED ORDER — BUTALBITAL-APAP-CAFFEINE 50-325-40 MG PO TABS: 1.0000 | ORAL_TABLET | Freq: Four times a day (QID) | ORAL | 0 refills | Status: DC | PRN

## 2023-01-13 ENCOUNTER — Encounter: Payer: Self-pay | Admitting: Family Medicine

## 2023-01-13 NOTE — Telephone Encounter (Signed)
Message sent to Dr Clent Ridges for review as Rx was sent to the mail order pharmacy instead of local pharmacy.

## 2023-01-14 MED ORDER — BUTALBITAL-APAP-CAFFEINE 50-325-40 MG PO TABS: 1.0000 | ORAL_TABLET | Freq: Four times a day (QID) | ORAL | 0 refills | Status: DC | PRN

## 2023-01-14 NOTE — Telephone Encounter (Signed)
I sent them to [facility]

## 2023-02-21 ENCOUNTER — Encounter: Payer: Self-pay | Admitting: Family Medicine

## 2023-02-22 MED ORDER — CYCLOBENZAPRINE HCL 5 MG PO TABS: 5.0000 mg | ORAL_TABLET | Freq: Three times a day (TID) | ORAL | 0 refills | Status: DC | PRN

## 2023-02-22 NOTE — Telephone Encounter (Signed)
Rx done. 

## 2023-03-25 ENCOUNTER — Ambulatory Visit (INDEPENDENT_AMBULATORY_CARE_PROVIDER_SITE_OTHER): Payer: Medicare Other

## 2023-03-25 VITALS — BP 120/60 | HR 77 | Temp 98.2°F | Ht 65.0 in | Wt 173.8 lb

## 2023-03-25 DIAGNOSIS — Z0000 Encounter for general adult medical examination without abnormal findings: Secondary | ICD-10-CM | POA: Diagnosis not present

## 2023-03-25 NOTE — Patient Instructions (Addendum)
Ms. Donate , Thank you for taking time to come for your Medicare Wellness Visit. I appreciate your ongoing commitment to your health goals. Please review the following plan we discussed and let me know if I can assist you in the future.   Referrals/Orders/Follow-Ups/Clinician Recommendations:   This is a list of the screening recommended for you and due dates:  Health Maintenance  Topic Date Due   COVID-19 Vaccine (6 - 2024-25 season) 12/12/2022   Mammogram  06/22/2023*   Medicare Annual Wellness Visit  03/24/2024   Colon Cancer Screening  05/09/2028   DTaP/Tdap/Td vaccine (3 - Td or Tdap) 08/13/2031   Pneumonia Vaccine  Completed   Flu Shot  Completed   DEXA scan (bone density measurement)  Completed   Hepatitis C Screening  Completed   Zoster (Shingles) Vaccine  Completed   HPV Vaccine  Aged Out  *Topic was postponed. The date shown is not the original due date.    Advanced directives: (Copy Requested) Please bring a copy of your health care power of attorney and living will to the office to be added to your chart at your convenience.  Next Medicare Annual Wellness Visit scheduled for next year: Yes
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
## 2023-03-28 ENCOUNTER — Other Ambulatory Visit: Payer: Self-pay | Admitting: Family Medicine

## 2023-03-28 DIAGNOSIS — M25519 Pain in unspecified shoulder: Secondary | ICD-10-CM

## 2023-03-29 ENCOUNTER — Other Ambulatory Visit: Payer: Self-pay

## 2023-03-29 MED ORDER — BUTALBITAL-APAP-CAFFEINE 50-325-40 MG PO TABS: 1.0000 | ORAL_TABLET | Freq: Four times a day (QID) | ORAL | 0 refills | Status: DC | PRN

## 2023-04-15 DIAGNOSIS — H52223 Regular astigmatism, bilateral: Secondary | ICD-10-CM | POA: Diagnosis not present

## 2023-05-27 ENCOUNTER — Encounter: Payer: Self-pay | Admitting: Family Medicine

## 2023-05-27 MED ORDER — ROSUVASTATIN CALCIUM 40 MG PO TABS: 40.0000 mg | ORAL_TABLET | Freq: Every day | ORAL | 1 refills | Status: DC

## 2023-05-27 MED ORDER — LISINOPRIL 20 MG PO TABS: 20.0000 mg | ORAL_TABLET | Freq: Every day | ORAL | 1 refills | Status: DC

## 2023-05-30 ENCOUNTER — Encounter: Payer: Self-pay | Admitting: Family Medicine

## 2023-05-30 MED ORDER — PANTOPRAZOLE SODIUM 40 MG PO TBEC: 40.0000 mg | DELAYED_RELEASE_TABLET | Freq: Every day | ORAL | 1 refills | Status: DC

## 2023-06-01 DIAGNOSIS — D225 Melanocytic nevi of trunk: Secondary | ICD-10-CM | POA: Diagnosis not present

## 2023-06-01 DIAGNOSIS — L821 Other seborrheic keratosis: Secondary | ICD-10-CM | POA: Diagnosis not present

## 2023-06-01 DIAGNOSIS — L814 Other melanin hyperpigmentation: Secondary | ICD-10-CM | POA: Diagnosis not present

## 2023-06-01 DIAGNOSIS — Z08 Encounter for follow-up examination after completed treatment for malignant neoplasm: Secondary | ICD-10-CM | POA: Diagnosis not present

## 2023-06-01 MED ORDER — CYCLOBENZAPRINE HCL 5 MG PO TABS
5.0000 mg | ORAL_TABLET | Freq: Three times a day (TID) | ORAL | 0 refills | Status: DC | PRN
Start: 2023-06-01 — End: 2023-09-06

## 2023-06-02 DIAGNOSIS — Z96641 Presence of right artificial hip joint: Secondary | ICD-10-CM | POA: Diagnosis not present

## 2023-06-02 DIAGNOSIS — Z4789 Encounter for other orthopedic aftercare: Secondary | ICD-10-CM | POA: Diagnosis not present

## 2023-06-22 DIAGNOSIS — Z1231 Encounter for screening mammogram for malignant neoplasm of breast: Secondary | ICD-10-CM | POA: Diagnosis not present

## 2023-06-22 DIAGNOSIS — M8588 Other specified disorders of bone density and structure, other site: Secondary | ICD-10-CM | POA: Diagnosis not present

## 2023-06-22 DIAGNOSIS — Z01419 Encounter for gynecological examination (general) (routine) without abnormal findings: Secondary | ICD-10-CM | POA: Diagnosis not present

## 2023-06-22 DIAGNOSIS — Z6828 Body mass index (BMI) 28.0-28.9, adult: Secondary | ICD-10-CM | POA: Diagnosis not present

## 2023-06-22 LAB — HM DEXA SCAN

## 2023-06-22 LAB — HM MAMMOGRAPHY

## 2023-07-06 ENCOUNTER — Ambulatory Visit (INDEPENDENT_AMBULATORY_CARE_PROVIDER_SITE_OTHER): Payer: Medicare Other | Admitting: Family Medicine

## 2023-07-06 ENCOUNTER — Encounter: Payer: Self-pay | Admitting: Family Medicine

## 2023-07-06 VITALS — BP 140/70 | HR 91 | Temp 97.9°F | Wt 175.3 lb

## 2023-07-06 DIAGNOSIS — M25519 Pain in unspecified shoulder: Secondary | ICD-10-CM

## 2023-07-06 DIAGNOSIS — I10 Essential (primary) hypertension: Secondary | ICD-10-CM

## 2023-07-06 DIAGNOSIS — M545 Low back pain, unspecified: Secondary | ICD-10-CM | POA: Diagnosis not present

## 2023-07-06 DIAGNOSIS — E785 Hyperlipidemia, unspecified: Secondary | ICD-10-CM

## 2023-07-06 DIAGNOSIS — M8580 Other specified disorders of bone density and structure, unspecified site: Secondary | ICD-10-CM

## 2023-07-06 DIAGNOSIS — G8929 Other chronic pain: Secondary | ICD-10-CM

## 2023-07-06 MED ORDER — BUTALBITAL-APAP-CAFFEINE 50-325-40 MG PO TABS: 1.0000 | ORAL_TABLET | Freq: Four times a day (QID) | ORAL | 0 refills | Status: DC | PRN

## 2023-07-06 MED ORDER — TRAMADOL HCL 50 MG PO TABS: 50.0000 mg | ORAL_TABLET | Freq: Four times a day (QID) | ORAL | 0 refills | Status: AC | PRN

## 2023-07-06 MED ORDER — GABAPENTIN 600 MG PO TABS: ORAL_TABLET | ORAL | 3 refills | Status: AC

## 2023-07-06 MED ORDER — AMLODIPINE BESYLATE 5 MG PO TABS: ORAL_TABLET | ORAL | 3 refills | Status: AC

## 2023-07-06 NOTE — Patient Instructions (Signed)
 Monitor blood pressure at least couple of times per week  Be in touch if BP consistently >130/80
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
## 2023-08-17 ENCOUNTER — Other Ambulatory Visit: Payer: Self-pay | Admitting: Family Medicine

## 2023-08-17 MED ORDER — BUTALBITAL-APAP-CAFFEINE 50-325-40 MG PO TABS: 1.0000 | ORAL_TABLET | Freq: Four times a day (QID) | ORAL | 1 refills | Status: DC | PRN

## 2023-09-06 ENCOUNTER — Encounter: Payer: Self-pay | Admitting: Family Medicine

## 2023-09-06 MED ORDER — CYCLOBENZAPRINE HCL 5 MG PO TABS: 5.0000 mg | ORAL_TABLET | Freq: Three times a day (TID) | ORAL | 0 refills | Status: DC | PRN

## 2023-11-01 ENCOUNTER — Encounter: Payer: Self-pay | Admitting: Family Medicine

## 2023-11-01 MED ORDER — BUTALBITAL-APAP-CAFFEINE 50-325-40 MG PO TABS: 1.0000 | ORAL_TABLET | Freq: Four times a day (QID) | ORAL | 1 refills | Status: DC | PRN

## 2023-11-17 DIAGNOSIS — L218 Other seborrheic dermatitis: Secondary | ICD-10-CM | POA: Diagnosis not present

## 2023-11-17 DIAGNOSIS — D1801 Hemangioma of skin and subcutaneous tissue: Secondary | ICD-10-CM | POA: Diagnosis not present

## 2023-11-17 DIAGNOSIS — L814 Other melanin hyperpigmentation: Secondary | ICD-10-CM | POA: Diagnosis not present

## 2023-11-17 DIAGNOSIS — L821 Other seborrheic keratosis: Secondary | ICD-10-CM | POA: Diagnosis not present

## 2023-12-06 ENCOUNTER — Encounter: Payer: Self-pay | Admitting: Family Medicine

## 2023-12-06 MED ORDER — BUTALBITAL-APAP-CAFFEINE 50-325-40 MG PO TABS: 1.0000 | ORAL_TABLET | Freq: Four times a day (QID) | ORAL | 1 refills | Status: DC | PRN

## 2023-12-06 MED ORDER — CYCLOBENZAPRINE HCL 5 MG PO TABS: 5.0000 mg | ORAL_TABLET | Freq: Three times a day (TID) | ORAL | 0 refills | Status: DC | PRN

## 2023-12-06 MED ORDER — ROSUVASTATIN CALCIUM 40 MG PO TABS: 40.0000 mg | ORAL_TABLET | Freq: Every day | ORAL | 1 refills | Status: AC

## 2023-12-06 MED ORDER — LISINOPRIL 20 MG PO TABS: 20.0000 mg | ORAL_TABLET | Freq: Every day | ORAL | 1 refills | Status: AC

## 2023-12-06 MED ORDER — PANTOPRAZOLE SODIUM 40 MG PO TBEC: 40.0000 mg | DELAYED_RELEASE_TABLET | Freq: Every day | ORAL | 1 refills | Status: AC

## 2024-01-10 ENCOUNTER — Encounter: Admitting: Adult Health

## 2024-01-11 ENCOUNTER — Encounter: Payer: Self-pay | Admitting: Family Medicine

## 2024-01-11 ENCOUNTER — Ambulatory Visit (INDEPENDENT_AMBULATORY_CARE_PROVIDER_SITE_OTHER): Admitting: Family Medicine

## 2024-01-11 VITALS — BP 138/78 | HR 89 | Temp 98.4°F | Ht 64.57 in | Wt 169.1 lb

## 2024-01-11 DIAGNOSIS — E785 Hyperlipidemia, unspecified: Secondary | ICD-10-CM | POA: Diagnosis not present

## 2024-01-11 DIAGNOSIS — Z0000 Encounter for general adult medical examination without abnormal findings: Secondary | ICD-10-CM

## 2024-01-11 DIAGNOSIS — R739 Hyperglycemia, unspecified: Secondary | ICD-10-CM | POA: Diagnosis not present

## 2024-01-11 DIAGNOSIS — Z23 Encounter for immunization: Secondary | ICD-10-CM

## 2024-01-11 DIAGNOSIS — I10 Essential (primary) hypertension: Secondary | ICD-10-CM

## 2024-01-11 NOTE — Progress Notes (Signed)
 Established Patient Office Visit  Subjective   Patient ID: Andrea [name], female    DOB: 01-17-1951  Age: 73 y.o. MRN: [id number]  Chief Complaint  Patient presents with   Annual Exam    HPI   Andrea [name] is seen today for physical exam.  She continues to grieve the loss of her husband from a couple months ago.  He died from complications of Wernicke Encephalopathy.  She is getting counseling through hospice and has follow-up appointments scheduled.  Has supportive family.  Still walking about 4 to 5 miles per day with her dog.  She has past history of CVA, hypertension, mild hyperglycemia, osteopenia, chronic back pain, hyperlipidemia.  She has had previous cervical neck fusion and has some chronic neck pain.  She remains on rosuvastatin  for hyperlipidemia 40 mg daily.  Blood pressure treated with amlodipine  and lisinopril .  Blood pressure generally well-controlled.  Health maintenance reviewed:  Health Maintenance  Topic Date Due   COVID-19 Vaccine (6 - 2025-26 season) 12/12/2023   Medicare Annual Wellness (AWV)  03/24/2024   Mammogram  06/21/2024   Colonoscopy  05/09/2028   DTaP/Tdap/Td (3 - Td or Tdap) 08/13/2031   Pneumococcal Vaccine: 50+ Years  Completed   Influenza Vaccine  Completed   DEXA SCAN  Completed   Hepatitis C Screening  Completed   Zoster Vaccines- Shingrix  Completed   HPV VACCINES  Aged Out   Meningococcal B Vaccine  Aged Out     Review of Systems  Constitutional:  Negative for chills, fever and weight loss.  HENT:  Negative for hearing loss.   Eyes:  Negative for blurred vision and double vision.  Respiratory:  Negative for cough and shortness of breath.   Cardiovascular:  Negative for chest pain, palpitations and leg swelling.  Gastrointestinal:  Negative for abdominal pain, blood in stool, constipation and diarrhea.  Genitourinary:  Negative for dysuria.  Skin:  Negative for rash.  Neurological:  Negative for dizziness, speech change, seizures, loss  of consciousness and headaches.  Psychiatric/Behavioral:  Positive for depression. Negative for suicidal ideas.       Objective:     BP 138/78 (BP Location: Right Arm, Cuff Size: Normal)   Pulse 89   Temp 98.4 F (36.9 C) (Oral)   Ht 5' 4.57 (1.64 m)   Wt 169 lb 1.6 oz (76.7 kg)   SpO2 96%   BMI 28.52 kg/m  BP Readings from Last 3 Encounters:  01/11/24 138/78  07/06/23 (!) 140/70  03/25/23 120/60   Wt Readings from Last 3 Encounters:  01/11/24 169 lb 1.6 oz (76.7 kg)  07/06/23 175 lb 4.8 oz (79.5 kg)  03/25/23 173 lb 12.8 oz (78.8 kg)      Physical Exam Vitals reviewed.  Constitutional:      General: She is not in acute distress.    Appearance: She is well-developed. She is not ill-appearing.  HENT:     Right Ear: Tympanic membrane and ear canal normal.     Left Ear: Tympanic membrane and ear canal normal.  Eyes:     Pupils: Pupils are equal, round, and reactive to light.  Neck:     Thyroid : No thyromegaly.     Vascular: No JVD.  Cardiovascular:     Rate and Rhythm: Normal rate and regular rhythm.     Heart sounds:     No gallop.  Pulmonary:     Effort: Pulmonary effort is normal. No respiratory distress.     Breath sounds: Normal  breath sounds. No wheezing or rales.  Abdominal:     Palpations: Abdomen is soft.     Tenderness: There is no abdominal tenderness.  Musculoskeletal:     Cervical back: Neck supple.     Right lower leg: No edema.     Left lower leg: No edema.  Neurological:     General: No focal deficit present.     Mental Status: She is alert.     Cranial Nerves: No cranial nerve deficit.      No results found for any visits on 01/11/24.    The ASCVD Risk score (Arnett DK, et al., 2019) failed to calculate for the following reasons:   Risk score cannot be calculated because patient has a medical history suggesting prior/existing ASCVD    Assessment & Plan:   73 year old female here for physical exam.  She is still going through  grieving process of losing her husband.  She is currently getting some regular counseling.  She has history of CVA, hypertension, hyperlipidemia.  Medications reviewed.  Compliant with all.  Initial blood pressure was quite elevated but came down significantly after rest.  We discussed the following health maintenance issues  -Influenza vaccine recommended and patient consents - Colonoscopy due 2030 - Tetanus up-to-date - Prior hepatitis C screen negative - Pneumonia and shingles vaccines complete - Getting annual mammograms  -Continue regular exercise habits.  She is currently walking 4 to 5 miles per day. - Check labs including lipid, CMP, A1c   No follow-ups on file.    Wolm Scarlet, MD

## 2024-01-11 NOTE — Patient Instructions (Signed)
Monitor blood pressure and be in touch if consistently > 130/80 

## 2024-01-12 LAB — CBC WITH DIFFERENTIAL/PLATELET
Basophils Absolute: 0.1 K/uL (ref 0.0–0.1)
Basophils Relative: 1.2 % (ref 0.0–3.0)
Eosinophils Absolute: 0.9 K/uL — ABNORMAL HIGH (ref 0.0–0.7)
Eosinophils Relative: 10.7 % — ABNORMAL HIGH (ref 0.0–5.0)
HCT: 42.9 % (ref 36.0–46.0)
Hemoglobin: 14.3 g/dL (ref 12.0–15.0)
Lymphocytes Relative: 25.9 % (ref 12.0–46.0)
Lymphs Abs: 2.1 K/uL (ref 0.7–4.0)
MCHC: 33.2 g/dL (ref 30.0–36.0)
MCV: 88 fl (ref 78.0–100.0)
Monocytes Absolute: 0.6 K/uL (ref 0.1–1.0)
Monocytes Relative: 7.2 % (ref 3.0–12.0)
Neutro Abs: 4.4 K/uL (ref 1.4–7.7)
Neutrophils Relative %: 55 % (ref 43.0–77.0)
Platelets: 233 K/uL (ref 150.0–400.0)
RBC: 4.88 Mil/uL (ref 3.87–5.11)
RDW: 12.8 % (ref 11.5–15.5)
WBC: 8 K/uL (ref 4.0–10.5)

## 2024-01-12 LAB — COMPREHENSIVE METABOLIC PANEL WITH GFR
ALT: 17 U/L (ref 0–35)
AST: 19 U/L (ref 0–37)
Albumin: 4.6 g/dL (ref 3.5–5.2)
Alkaline Phosphatase: 97 U/L (ref 39–117)
BUN: 14 mg/dL (ref 6–23)
CO2: 30 meq/L (ref 19–32)
Calcium: 10.1 mg/dL (ref 8.4–10.5)
Chloride: 103 meq/L (ref 96–112)
Creatinine, Ser: 1.17 mg/dL (ref 0.40–1.20)
GFR: 46.65 mL/min — ABNORMAL LOW (ref >60.00)
Glucose, Bld: 101 mg/dL — ABNORMAL HIGH (ref 70–99)
Potassium: 4.6 meq/L (ref 3.5–5.1)
Sodium: 141 meq/L (ref 135–145)
Total Bilirubin: 0.4 mg/dL (ref 0.2–1.2)
Total Protein: 6.9 g/dL (ref 6.0–8.3)

## 2024-01-12 LAB — LIPID PANEL
Cholesterol: 141 mg/dL (ref 0–200)
HDL: 52.6 mg/dL (ref >39.00)
LDL Cholesterol: 64 mg/dL (ref 0–99)
NonHDL: 88.4
Total CHOL/HDL Ratio: 3
Triglycerides: 124 mg/dL (ref 0.0–149.0)
VLDL: 24.8 mg/dL (ref 0.0–40.0)

## 2024-01-12 LAB — HEMOGLOBIN A1C: Hgb A1c MFr Bld: 5.7 % (ref 4.6–6.5)

## 2024-01-14 ENCOUNTER — Ambulatory Visit: Payer: Self-pay | Admitting: Family Medicine

## 2024-01-17 ENCOUNTER — Encounter: Payer: Self-pay | Admitting: Family Medicine

## 2024-02-17 NOTE — Progress Notes (Signed)
 Andrea [name]                                          MRN: [id number]   02/17/2024   The [facility] Quality Team Specialist reviewed this patient medical record for the purposes of chart review for care gap closure. The following were reviewed: abstraction for care gap closure-controlling blood pressure.    [facility] Quality Team

## 2024-03-06 ENCOUNTER — Encounter: Payer: Self-pay | Admitting: Family Medicine

## 2024-03-06 MED ORDER — BUTALBITAL-APAP-CAFFEINE 50-325-40 MG PO TABS: 1.0000 | ORAL_TABLET | Freq: Four times a day (QID) | ORAL | 0 refills | Status: AC | PRN

## 2024-03-06 MED ORDER — CYCLOBENZAPRINE HCL 5 MG PO TABS: 5.0000 mg | ORAL_TABLET | Freq: Three times a day (TID) | ORAL | 1 refills | Status: AC | PRN

## 2024-03-30 ENCOUNTER — Ambulatory Visit: Payer: Medicare Other

## 2024-03-30 VITALS — BP 120/60 | HR 84 | Temp 98.2°F | Ht 64.0 in | Wt 173.6 lb

## 2024-03-30 DIAGNOSIS — Z0000 Encounter for general adult medical examination without abnormal findings: Secondary | ICD-10-CM | POA: Diagnosis not present

## 2024-03-30 NOTE — Patient Instructions (Addendum)
 Andrea [name],  Thank you for taking the time for your Medicare Wellness Visit. I appreciate your continued commitment to your health goals. Please review the care plan we discussed, and feel free to reach out if I can assist you further.  Please note that Annual Wellness Visits do not include a physical exam. Some assessments may be limited, especially if the visit was conducted virtually. If needed, we may recommend an in-person follow-up with your provider.  Ongoing Care Seeing your primary care provider every 3 to 6 months helps us  monitor your health and provide consistent, personalized care.   Referrals If a referral was made during today's visit and you haven't received any updates within two weeks, please contact the referred provider directly to check on the status.  Recommended Screenings:  Health Maintenance  Topic Date Due   COVID-19 Vaccine (6 - 2025-26 season) 12/12/2023   Breast Cancer Screening  06/21/2024   Medicare Annual Wellness Visit  03/30/2025   Colon Cancer Screening  05/09/2028   DTaP/Tdap/Td vaccine (3 - Td or Tdap) 08/13/2031   Pneumococcal Vaccine for age over 83  Completed   Flu Shot  Completed   Osteoporosis screening with Bone Density Scan  Completed   Hepatitis C Screening  Completed   Zoster (Shingles) Vaccine  Completed   Meningitis B Vaccine  Aged Out       03/30/2024   11:35 AM  Advanced Directives  Does Patient Have a Medical Advance Directive? Yes  Type of Estate Agent of [place];Living will  Does patient want to make changes to medical advance directive? No - Patient declined  Copy of Healthcare Power of Attorney in Chart? No - copy requested    Vision: Annual vision screenings are recommended for early detection of glaucoma, cataracts, and diabetic retinopathy. These exams can also reveal signs of chronic conditions such as diabetes and high blood pressure.  Dental: Annual dental screenings help detect early signs  of oral cancer, gum disease, and other conditions linked to overall health, including heart disease and diabetes.  Please see the attached documents for additional preventive care recommendations.
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
## 2025-04-08 ENCOUNTER — Ambulatory Visit
